# Patient Record
Sex: Male | Born: 1960 | Race: White | Hispanic: No | Marital: Married | Smoking: Former smoker
Health system: Southern US, Community
[De-identification: ages and names within clinical notes are randomized; demographics above are authoritative.]

## PROBLEM LIST (undated history)

## (undated) DIAGNOSIS — C801 Malignant (primary) neoplasm, unspecified: Secondary | ICD-10-CM

## (undated) DIAGNOSIS — E119 Type 2 diabetes mellitus without complications: Secondary | ICD-10-CM

## (undated) DIAGNOSIS — K219 Gastro-esophageal reflux disease without esophagitis: Secondary | ICD-10-CM

## (undated) DIAGNOSIS — I10 Essential (primary) hypertension: Secondary | ICD-10-CM

## (undated) DIAGNOSIS — M109 Gout, unspecified: Secondary | ICD-10-CM

## (undated) DIAGNOSIS — E785 Hyperlipidemia, unspecified: Secondary | ICD-10-CM

## (undated) HISTORY — DX: Hyperlipidemia, unspecified: E78.5

## (undated) HISTORY — PX: HERNIA REPAIR: SHX51

## (undated) HISTORY — PX: FRACTURE SURGERY: SHX138

## (undated) HISTORY — DX: Type 2 diabetes mellitus without complications: E11.9

## (undated) HISTORY — PX: OTHER SURGICAL HISTORY: SHX169

## (undated) HISTORY — PX: INGUINAL HERNIA REPAIR: SUR1180

---

## 2004-02-04 DIAGNOSIS — C801 Malignant (primary) neoplasm, unspecified: Secondary | ICD-10-CM

## 2004-02-04 HISTORY — DX: Malignant (primary) neoplasm, unspecified: C80.1

## 2004-02-04 HISTORY — PX: KIDNEY SURGERY: SHX687

## 2013-08-24 ENCOUNTER — Encounter (HOSPITAL_COMMUNITY): Payer: Self-pay | Admitting: Pharmacy Technician

## 2013-08-24 ENCOUNTER — Encounter (HOSPITAL_COMMUNITY): Payer: Self-pay | Admitting: *Deleted

## 2013-08-24 NOTE — Progress Notes (Signed)
08/24/13 1903  OBSTRUCTIVE SLEEP APNEA  Have you ever been diagnosed with sleep apnea through a sleep study? No  Do you snore loudly (loud enough to be heard through closed doors)?  0  Do you often feel tired, fatigued, or sleepy during the daytime? 0  Has anyone observed you stop breathing during your sleep? 0  Do you have, or are you being treated for high blood pressure? 1  BMI more than 35 kg/m2? 1  Age over 51 years old? 1  Neck circumference greater than 40 cm/18 inches? 0  Gender: 1  Obstructive Sleep Apnea Score 4  Score 4 or greater  Results sent to PCP

## 2013-08-24 NOTE — Progress Notes (Signed)
Pt denies SOB, chest pain, and being under the care of a cardiologist. Pt denies having any cardiac studies or an EKG within the last year but states that he had a chest x ray last month at [facility]. Pt made aware to Stop taking Aspirin, vitamins,  and herbal medications (fish oil) Do not take any NSAIDs ie: Ibuprofen, Advil, Naproxen or any medication containing Aspirin.

## 2013-08-25 ENCOUNTER — Encounter (HOSPITAL_COMMUNITY): Payer: Self-pay | Admitting: Surgery

## 2013-08-25 ENCOUNTER — Observation Stay (HOSPITAL_COMMUNITY)
Admission: RE | Admit: 2013-08-25 | Discharge: 2013-08-26 | Disposition: A | Discharge status: Home / Self Care | Payer: BC Managed Care – PPO | Source: Ambulatory Visit | Attending: Orthopedic Surgery | Admitting: Orthopedic Surgery

## 2013-08-25 ENCOUNTER — Inpatient Hospital Stay (HOSPITAL_COMMUNITY): Payer: BC Managed Care – PPO

## 2013-08-25 ENCOUNTER — Inpatient Hospital Stay (HOSPITAL_COMMUNITY): Payer: BC Managed Care – PPO | Admitting: Anesthesiology

## 2013-08-25 ENCOUNTER — Observation Stay (HOSPITAL_COMMUNITY): Payer: BC Managed Care – PPO

## 2013-08-25 ENCOUNTER — Encounter (HOSPITAL_COMMUNITY)
Admission: RE | Disposition: A | Discharge status: Home / Self Care | Payer: Self-pay | Source: Ambulatory Visit | Attending: Orthopedic Surgery

## 2013-08-25 ENCOUNTER — Encounter (HOSPITAL_COMMUNITY): Payer: BC Managed Care – PPO | Admitting: Anesthesiology

## 2013-08-25 DIAGNOSIS — I10 Essential (primary) hypertension: Secondary | ICD-10-CM | POA: Insufficient documentation

## 2013-08-25 DIAGNOSIS — S82109A Unspecified fracture of upper end of unspecified tibia, initial encounter for closed fracture: Principal | ICD-10-CM | POA: Insufficient documentation

## 2013-08-25 DIAGNOSIS — Z79899 Other long term (current) drug therapy: Secondary | ICD-10-CM | POA: Insufficient documentation

## 2013-08-25 DIAGNOSIS — K219 Gastro-esophageal reflux disease without esophagitis: Secondary | ICD-10-CM | POA: Insufficient documentation

## 2013-08-25 HISTORY — DX: Essential (primary) hypertension: I10

## 2013-08-25 HISTORY — DX: Malignant (primary) neoplasm, unspecified: C80.1

## 2013-08-25 HISTORY — PX: ORIF TIBIA PLATEAU: SHX2132

## 2013-08-25 HISTORY — DX: Gastro-esophageal reflux disease without esophagitis: K21.9

## 2013-08-25 HISTORY — DX: Gout, unspecified: M10.9

## 2013-08-25 LAB — CBC
Hemoglobin: 14.2 g/dL (ref 13.0–17.0)
MCH: 31.3 pg (ref 26.0–34.0)
MCHC: 34.7 g/dL (ref 30.0–36.0)
Platelets: 206 10*3/uL (ref 150–400)
RDW: 13.8 % (ref 11.5–15.5)

## 2013-08-25 LAB — BASIC METABOLIC PANEL
BUN: 24 mg/dL — ABNORMAL HIGH (ref 6–23)
Chloride: 95 mEq/L — ABNORMAL LOW (ref 96–112)
Creatinine, Ser: 1.45 mg/dL — ABNORMAL HIGH (ref 0.50–1.35)
GFR calc Af Amer: 63 mL/min — ABNORMAL LOW (ref >90)
GFR calc non Af Amer: 54 mL/min — ABNORMAL LOW (ref >90)
Glucose, Bld: 114 mg/dL — ABNORMAL HIGH (ref 70–99)
Potassium: 4.3 mEq/L (ref 3.5–5.1)

## 2013-08-25 LAB — TYPE AND SCREEN: ABO/RH(D): O POS

## 2013-08-25 LAB — ABO/RH: ABO/RH(D): O POS

## 2013-08-25 SURGERY — OPEN REDUCTION INTERNAL FIXATION (ORIF) TIBIAL PLATEAU
Anesthesia: General | Site: Knee | Laterality: Left | Wound class: Clean

## 2013-08-25 MED ORDER — DEXTROSE 5 % IV SOLN
500.0000 mg | Freq: Four times a day (QID) | INTRAVENOUS | Status: DC | PRN
  Filled 2013-08-25: qty 5

## 2013-08-25 MED ORDER — DEXTROSE-NACL 5-0.45 % IV SOLN
INTRAVENOUS | Status: AC
  Administered 2013-08-25: 14:00:00 via INTRAVENOUS

## 2013-08-25 MED ORDER — ROCURONIUM BROMIDE 100 MG/10ML IV SOLN
INTRAVENOUS | Status: DC | PRN
  Administered 2013-08-25: 50 mg via INTRAVENOUS

## 2013-08-25 MED ORDER — OMEPRAZOLE MAGNESIUM 20 MG PO TBEC: 20.0000 mg | DELAYED_RELEASE_TABLET | Freq: Every day | ORAL | Status: DC

## 2013-08-25 MED ORDER — PANTOPRAZOLE SODIUM 40 MG PO TBEC
40.0000 mg | DELAYED_RELEASE_TABLET | Freq: Every day | ORAL | Status: DC
  Filled 2013-08-25: qty 1

## 2013-08-25 MED ORDER — NEOSTIGMINE METHYLSULFATE 1 MG/ML IJ SOLN
INTRAMUSCULAR | Status: DC | PRN
  Administered 2013-08-25: 2 mg via INTRAVENOUS
  Administered 2013-08-25: 3 mg via INTRAVENOUS

## 2013-08-25 MED ORDER — ASPIRIN EC 325 MG PO TBEC: 81.0000 mg | DELAYED_RELEASE_TABLET | Freq: Every day | ORAL | Status: DC

## 2013-08-25 MED ORDER — PHENYLEPHRINE HCL 10 MG/ML IJ SOLN
10.0000 mg | INTRAVENOUS | Status: DC | PRN
  Administered 2013-08-25: 60 ug/min via INTRAVENOUS

## 2013-08-25 MED ORDER — OXYCODONE-ACETAMINOPHEN 5-325 MG PO TABS: 2.0000 | ORAL_TABLET | ORAL | Status: DC | PRN

## 2013-08-25 MED ORDER — ONDANSETRON HCL 4 MG PO TABS: 4.0000 mg | ORAL_TABLET | Freq: Four times a day (QID) | ORAL | Status: DC | PRN

## 2013-08-25 MED ORDER — BUPIVACAINE-EPINEPHRINE PF 0.5-1:200000 % IJ SOLN
INTRAMUSCULAR | Status: DC | PRN
  Administered 2013-08-25: 150 mg

## 2013-08-25 MED ORDER — ALLOPURINOL 300 MG PO TABS
300.0000 mg | ORAL_TABLET | Freq: Every day | ORAL | Status: DC
  Filled 2013-08-25: qty 1

## 2013-08-25 MED ORDER — 0.9 % SODIUM CHLORIDE (POUR BTL) OPTIME
TOPICAL | Status: DC | PRN
  Administered 2013-08-25: 1000 mL

## 2013-08-25 MED ORDER — ARTIFICIAL TEARS OP OINT
TOPICAL_OINTMENT | OPHTHALMIC | Status: DC | PRN
  Administered 2013-08-25: 1 via OPHTHALMIC

## 2013-08-25 MED ORDER — ONDANSETRON HCL 4 MG/2ML IJ SOLN: 4.0000 mg | Freq: Four times a day (QID) | INTRAMUSCULAR | Status: DC | PRN

## 2013-08-25 MED ORDER — DEXTROSE-NACL 5-0.45 % IV SOLN: 100.0000 mL/h | INTRAVENOUS | Status: DC

## 2013-08-25 MED ORDER — MIDAZOLAM HCL 5 MG/5ML IJ SOLN
INTRAMUSCULAR | Status: DC | PRN
  Administered 2013-08-25: 2 mg via INTRAVENOUS

## 2013-08-25 MED ORDER — LISINOPRIL 20 MG PO TABS
20.0000 mg | ORAL_TABLET | Freq: Every day | ORAL | Status: DC
  Administered 2013-08-25: 20 mg via ORAL
  Filled 2013-08-25 (×3): qty 1

## 2013-08-25 MED ORDER — DEXAMETHASONE SODIUM PHOSPHATE 4 MG/ML IJ SOLN
INTRAMUSCULAR | Status: DC | PRN
  Administered 2013-08-25: 4 mg

## 2013-08-25 MED ORDER — NIACIN 250 MG PO TABS
250.0000 mg | ORAL_TABLET | Freq: Every day | ORAL | Status: DC
  Administered 2013-08-26: 250 mg via ORAL
  Filled 2013-08-25 (×2): qty 1

## 2013-08-25 MED ORDER — PROPRANOLOL HCL 1 MG/ML IV SOLN
INTRAVENOUS | Status: DC | PRN
  Administered 2013-08-25 (×2): .5 mg via INTRAVENOUS

## 2013-08-25 MED ORDER — METHOCARBAMOL 500 MG PO TABS
500.0000 mg | ORAL_TABLET | Freq: Four times a day (QID) | ORAL | Status: DC | PRN
  Administered 2013-08-25 – 2013-08-26 (×4): 500 mg via ORAL
  Filled 2013-08-25 (×4): qty 1

## 2013-08-25 MED ORDER — PROMETHAZINE HCL 25 MG/ML IJ SOLN: 6.2500 mg | INTRAMUSCULAR | Status: DC | PRN

## 2013-08-25 MED ORDER — GLYCOPYRROLATE 0.2 MG/ML IJ SOLN
INTRAMUSCULAR | Status: DC | PRN
  Administered 2013-08-25: 0.3 mg via INTRAVENOUS
  Administered 2013-08-25: .5 mg via INTRAVENOUS

## 2013-08-25 MED ORDER — LISINOPRIL-HYDROCHLOROTHIAZIDE 20-12.5 MG PO TABS: 1.0000 | ORAL_TABLET | Freq: Every day | ORAL | Status: DC

## 2013-08-25 MED ORDER — ONDANSETRON HCL 4 MG/2ML IJ SOLN
INTRAMUSCULAR | Status: DC | PRN
  Administered 2013-08-25: 4 mg via INTRAVENOUS

## 2013-08-25 MED ORDER — DEXTROSE 5 % IV SOLN
3.0000 g | INTRAVENOUS | Status: AC
  Administered 2013-08-25: 3 g via INTRAVENOUS
  Filled 2013-08-25: qty 3000

## 2013-08-25 MED ORDER — OXYCODONE-ACETAMINOPHEN 5-325 MG PO TABS
1.0000 | ORAL_TABLET | ORAL | Status: DC | PRN
  Administered 2013-08-25 – 2013-08-26 (×6): 2 via ORAL
  Filled 2013-08-25 (×7): qty 2

## 2013-08-25 MED ORDER — FENTANYL CITRATE 0.05 MG/ML IJ SOLN
INTRAMUSCULAR | Status: DC | PRN
  Administered 2013-08-25: 50 ug via INTRAVENOUS
  Administered 2013-08-25: 100 ug via INTRAVENOUS
  Administered 2013-08-25 (×2): 50 ug via INTRAVENOUS

## 2013-08-25 MED ORDER — HYDROCHLOROTHIAZIDE 12.5 MG PO CAPS
12.5000 mg | ORAL_CAPSULE | Freq: Every day | ORAL | Status: DC
  Administered 2013-08-25: 12.5 mg via ORAL
  Filled 2013-08-25 (×2): qty 1

## 2013-08-25 MED ORDER — OXYCODONE HCL 5 MG PO TABS: 5.0000 mg | ORAL_TABLET | Freq: Once | ORAL | Status: DC | PRN

## 2013-08-25 MED ORDER — PROPOFOL 10 MG/ML IV BOLUS
INTRAVENOUS | Status: DC | PRN
  Administered 2013-08-25: 200 mg via INTRAVENOUS

## 2013-08-25 MED ORDER — ACETAMINOPHEN 500 MG PO TABS
1000.0000 mg | ORAL_TABLET | Freq: Once | ORAL | Status: AC
  Administered 2013-08-25: 1000 mg via ORAL
  Filled 2013-08-25: qty 2

## 2013-08-25 MED ORDER — DEXTROSE 5 % IV SOLN
INTRAVENOUS | Status: DC | PRN
  Administered 2013-08-25: 08:00:00 via INTRAVENOUS

## 2013-08-25 MED ORDER — CHLORHEXIDINE GLUCONATE 4 % EX LIQD: 60.0000 mL | Freq: Once | CUTANEOUS | Status: DC

## 2013-08-25 MED ORDER — OXYCODONE HCL 5 MG/5ML PO SOLN: 5.0000 mg | Freq: Once | ORAL | Status: DC | PRN

## 2013-08-25 MED ORDER — HYDROMORPHONE HCL PF 1 MG/ML IJ SOLN
0.2500 mg | INTRAMUSCULAR | Status: DC | PRN
  Administered 2013-08-25 (×2): 0.5 mg via INTRAVENOUS

## 2013-08-25 MED ORDER — HYDROMORPHONE HCL PF 1 MG/ML IJ SOLN
INTRAMUSCULAR | Status: AC
  Administered 2013-08-25: 0.5 mg via INTRAVENOUS
  Filled 2013-08-25: qty 1

## 2013-08-25 MED ORDER — MORPHINE SULFATE 2 MG/ML IJ SOLN: 1.0000 mg | INTRAMUSCULAR | Status: DC | PRN

## 2013-08-25 MED ORDER — CEFAZOLIN SODIUM-DEXTROSE 2-3 GM-% IV SOLR
2.0000 g | Freq: Four times a day (QID) | INTRAVENOUS | Status: AC
  Administered 2013-08-25 – 2013-08-26 (×3): 2 g via INTRAVENOUS
  Filled 2013-08-25 (×3): qty 50

## 2013-08-25 MED ORDER — METOCLOPRAMIDE HCL 5 MG PO TABS
5.0000 mg | ORAL_TABLET | Freq: Three times a day (TID) | ORAL | Status: DC | PRN
  Filled 2013-08-25: qty 2

## 2013-08-25 MED ORDER — LACTATED RINGERS IV SOLN
INTRAVENOUS | Status: DC | PRN
  Administered 2013-08-25 (×2): via INTRAVENOUS

## 2013-08-25 MED ORDER — ASPIRIN 81 MG PO CHEW
81.0000 mg | CHEWABLE_TABLET | Freq: Every day | ORAL | Status: DC
  Administered 2013-08-25 – 2013-08-26 (×2): 81 mg via ORAL
  Filled 2013-08-25 (×2): qty 1

## 2013-08-25 MED ORDER — METOCLOPRAMIDE HCL 5 MG/ML IJ SOLN: 5.0000 mg | Freq: Three times a day (TID) | INTRAMUSCULAR | Status: DC | PRN

## 2013-08-25 MED ORDER — ASPIRIN EC 325 MG PO TBEC: 325.0000 mg | DELAYED_RELEASE_TABLET | Freq: Every day | ORAL | Status: DC

## 2013-08-25 MED ORDER — PHENYLEPHRINE HCL 10 MG/ML IJ SOLN
INTRAMUSCULAR | Status: DC | PRN
  Administered 2013-08-25 (×3): 80 ug via INTRAVENOUS

## 2013-08-25 MED ORDER — ALBUMIN HUMAN 5 % IV SOLN
INTRAVENOUS | Status: DC | PRN
  Administered 2013-08-25: 08:00:00 via INTRAVENOUS

## 2013-08-25 SURGICAL SUPPLY — 63 items
BANDAGE ELASTIC 4 VELCRO ST LF (GAUZE/BANDAGES/DRESSINGS) IMPLANT
BANDAGE ELASTIC 6 VELCRO ST LF (GAUZE/BANDAGES/DRESSINGS) ×1 IMPLANT
BANDAGE ESMARK 6X9 LF (GAUZE/BANDAGES/DRESSINGS) ×1 IMPLANT
BANDAGE GAUZE ELAST BULKY 4 IN (GAUZE/BANDAGES/DRESSINGS) ×2 IMPLANT
BIT DRILL 2.5X125 (BIT) ×1 IMPLANT
BIT DRILL 3.1X204 (BIT) ×1 IMPLANT
BLADE SURG 10 STRL SS (BLADE) ×2 IMPLANT
BLADE SURG 15 STRL LF DISP TIS (BLADE) ×1 IMPLANT
BLADE SURG 15 STRL SS (BLADE) ×2
BNDG CMPR 9X6 STRL LF SNTH (GAUZE/BANDAGES/DRESSINGS) ×1
BNDG COHESIVE 4X5 TAN STRL (GAUZE/BANDAGES/DRESSINGS) ×2 IMPLANT
BNDG ESMARK 6X9 LF (GAUZE/BANDAGES/DRESSINGS) ×2
CLOTH BEACON ORANGE TIMEOUT ST (SAFETY) ×2 IMPLANT
CONNECTOR 5 IN 1 STRAIGHT STRL (MISCELLANEOUS) ×1 IMPLANT
COVER MAYO STAND STRL (DRAPES) ×2 IMPLANT
COVER SURGICAL LIGHT HANDLE (MISCELLANEOUS) ×2 IMPLANT
CUFF TOURNIQUET SINGLE 34IN LL (TOURNIQUET CUFF) ×1 IMPLANT
DRAPE C-ARM 42X72 X-RAY (DRAPES) ×2 IMPLANT
DRAPE C-ARMOR (DRAPES) ×1 IMPLANT
DRAPE INCISE IOBAN 66X45 STRL (DRAPES) ×4 IMPLANT
DRAPE U-SHAPE 47X51 STRL (DRAPES) ×2 IMPLANT
DRSG PAD ABDOMINAL 8X10 ST (GAUZE/BANDAGES/DRESSINGS) ×8 IMPLANT
ELECT REM PT RETURN 9FT ADLT (ELECTROSURGICAL) ×2
ELECTRODE REM PT RTRN 9FT ADLT (ELECTROSURGICAL) ×1 IMPLANT
GLOVE BIO SURGEON STRL SZ7.5 (GLOVE) ×2 IMPLANT
GLOVE BIOGEL PI IND STRL 8 (GLOVE) ×1 IMPLANT
GLOVE BIOGEL PI INDICATOR 8 (GLOVE) ×1
GOWN STRL NON-REIN LRG LVL3 (GOWN DISPOSABLE) ×4 IMPLANT
IMMOBILIZER KNEE 22 UNIV (SOFTGOODS) IMPLANT
KIT BASIN OR (CUSTOM PROCEDURE TRAY) ×2 IMPLANT
KIT ROOM TURNOVER OR (KITS) ×2 IMPLANT
MANIFOLD NEPTUNE II (INSTRUMENTS) ×2 IMPLANT
NS IRRIG 1000ML POUR BTL (IV SOLUTION) ×2 IMPLANT
PACK ARTHROSCOPY DSU (CUSTOM PROCEDURE TRAY) ×1 IMPLANT
PAD ARMBOARD 7.5X6 YLW CONV (MISCELLANEOUS) ×4 IMPLANT
PIN FIX STIENMAN 2X230 (PIN) IMPLANT
PIN STIENMAN 2.0 (PIN) ×4 IMPLANT
PLATE PROX LAT TIBIA 4H (Plate) ×1 IMPLANT
SCREW  LOCKING 4.0 (Screw) ×1 IMPLANT
SCREW CORT 2.5XFT 44X3.5XST (Screw) IMPLANT
SCREW CORTEX ST MATTA 3.5X55MM (Screw) ×1 IMPLANT
SCREW CORTEX ST MATTA 3.5X90MM (Screw) ×1 IMPLANT
SCREW CORTICAL 3.5X42MM (Screw) ×1 IMPLANT
SCREW CORTICAL 3.5X44MM (Screw) ×2 IMPLANT
SCREW LOCKING 75MM (Screw) ×1 IMPLANT
SPONGE GAUZE 4X4 12PLY (GAUZE/BANDAGES/DRESSINGS) ×2 IMPLANT
SPONGE LAP 18X18 X RAY DECT (DISPOSABLE) ×3 IMPLANT
STAPLER VISISTAT 35W (STAPLE) IMPLANT
STOCKINETTE IMPERVIOUS LG (DRAPES) ×2 IMPLANT
SUCTION FRAZIER TIP 10 FR DISP (SUCTIONS) ×2 IMPLANT
SUT ETHILON 3 0 PS 1 (SUTURE) ×1 IMPLANT
SUT FIBERWIRE #2 38 T-5 BLUE (SUTURE) ×4
SUT MNCRL AB 4-0 PS2 18 (SUTURE) ×1 IMPLANT
SUT MON AB 2-0 CT1 36 (SUTURE) ×1 IMPLANT
SUT VIC AB 0 CT1 27 (SUTURE) ×4
SUT VIC AB 0 CT1 27XBRD ANBCTR (SUTURE) ×2 IMPLANT
SUTURE FIBERWR #2 38 T-5 BLUE (SUTURE) IMPLANT
TOWEL OR 17X24 6PK STRL BLUE (TOWEL DISPOSABLE) ×2 IMPLANT
TOWEL OR 17X26 10 PK STRL BLUE (TOWEL DISPOSABLE) ×4 IMPLANT
TRAY FOLEY CATH 16FRSI W/METER (SET/KITS/TRAYS/PACK) IMPLANT
TUBE CONNECTING 12X1/4 (SUCTIONS) ×2 IMPLANT
WATER STERILE IRR 1000ML POUR (IV SOLUTION) ×2 IMPLANT
YANKAUER SUCT BULB TIP NO VENT (SUCTIONS) ×2 IMPLANT
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
## 2013-08-25 NOTE — Preoperative (Signed)
Beta Blockers   Reason not to administer Beta Blockers:Not Applicable 

## 2013-08-25 NOTE — Anesthesia Postprocedure Evaluation (Signed)
Anesthesia Post Note  Patient: Gordon Green  Procedure(s) Performed: Procedure(s) (LRB): OPEN REDUCTION INTERNAL FIXATION (ORIF) TIBIAL PLATEAU (Left)  Anesthesia type: general  Patient location: PACU  Post pain: Pain level controlled  Post assessment: Patient's Cardiovascular Status Stable  Last Vitals:  Filed Vitals:   08/25/13 1222  BP: 128/68  Pulse: 106  Temp: 37.2 C  Resp: 18    Post vital signs: Reviewed and stable  Level of consciousness: sedated  Complications: No apparent anesthesia complications

## 2013-08-25 NOTE — Anesthesia Preprocedure Evaluation (Addendum)
Anesthesia Evaluation  Patient identified by MRN, date of birth, ID band Patient awake    Reviewed: Allergy & Precautions, H&P , NPO status , Patient's Chart, lab work & pertinent test results, reviewed documented beta blocker date and time   History of Anesthesia Complications Negative for: history of anesthetic complications  Airway Mallampati: I TM Distance: >3 FB Neck ROM: Full    Dental  (+) Dental Advisory Given and Teeth Intact   Pulmonary neg pulmonary ROS,  breath sounds clear to auscultation  Pulmonary exam normal       Cardiovascular METS: hypertension, Pt. on medications Rhythm:Regular Rate:Normal     Neuro/Psych negative neurological ROS  negative psych ROS   GI/Hepatic Neg liver ROS, GERD-  Medicated,  Endo/Other  negative endocrine ROS  Renal/GU negative Renal ROS     Musculoskeletal   Abdominal (+) + obese,   Peds  Hematology   Anesthesia Other Findings Full Beard  Reproductive/Obstetrics                        Anesthesia Physical Anesthesia Plan  ASA: II  Anesthesia Plan: General   Post-op Pain Management:    Induction: Intravenous  Airway Management Planned: LMA  Additional Equipment:   Intra-op Plan:   Post-operative Plan: Extubation in OR  Informed Consent: I have reviewed the patients History and Physical, chart, labs and discussed the procedure including the risks, benefits and alternatives for the proposed anesthesia with the patient or authorized representative who has indicated his/her understanding and acceptance.   Dental advisory given  Plan Discussed with: CRNA, Anesthesiologist and Surgeon  Anesthesia Plan Comments:        Anesthesia Quick Evaluation

## 2013-08-25 NOTE — Transfer of Care (Signed)
Immediate Anesthesia Transfer of Care Note  Patient: Gordon Green  Procedure(s) Performed: Procedure(s): OPEN REDUCTION INTERNAL FIXATION (ORIF) TIBIAL PLATEAU (Left)  Patient Location: PACU  Anesthesia Type:General  Level of Consciousness: awake and patient cooperative  Airway & Oxygen Therapy: Patient Spontanous Breathing and Patient connected to face mask oxygen  Post-op Assessment: Report given to PACU RN and Post -op Vital signs reviewed and stable  Post vital signs: Reviewed and stable  Complications: No apparent anesthesia complications
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
## 2013-08-25 NOTE — Interval H&P Note (Signed)
History and Physical Interval Note:  08/25/2013 6:59 AM  Gordon Green  has presented today for surgery, with the diagnosis of LEFT TIBIAL PLATEAU FRACTURE  The various methods of treatment have been discussed with the patient and family. After consideration of risks, benefits and other options for treatment, the patient has consented to  Procedure(s): OPEN REDUCTION INTERNAL FIXATION (ORIF) TIBIAL PLATEAU (Left) as a surgical intervention .  The patient's history has been reviewed, patient examined, no change in status, stable for surgery.  I have reviewed the patient's chart and labs.  Questions were answered to the patient's satisfaction.     Averil Digman, D

## 2013-08-25 NOTE — Progress Notes (Signed)
Dr. Eulah Pont at stretcher side and told patient he would place order in [facility] for him to have 1,000 mg of acetaminophen. Patient and wife informed Dr. Eulah Pont that he had 325 mg of acetaminophen around 0545 this morning because he took a 7.5 mg Oxycodone. Dr. Eulah Pont stated that it was ok for patient to have the 1,000 mg Acetaminophen. Will administer medication as ordered.

## 2013-08-25 NOTE — Anesthesia Procedure Notes (Addendum)
Anesthesia Regional Block:  Femoral nerve block  Pre-Anesthetic Checklist: ,, timeout performed, Correct Patient, Correct Site, Correct Laterality, Correct Procedure, Correct Position, site marked, Risks and benefits discussed,  Surgical consent,  Pre-op evaluation,  At surgeon's request and post-op pain management  Laterality: Left  Prep: chloraprep       Needles:  Injection technique: Single-shot  Needle Type: Echogenic Stimulator Needle     Needle Length: 5cm 5 cm Needle Gauge: 22 and 22 G    Additional Needles:  Procedures: ultrasound guided (picture in chart) and nerve stimulator Femoral nerve block  Nerve Stimulator or Paresthesia:  Response: quadraceps contraction, 0.45 mA,   Additional Responses:   Narrative:  Start time: 08/25/2013 7:20 AM End time: 08/25/2013 7:29 AM Injection made incrementally with aspirations every 5 mL.  Performed by: Personally  Anesthesiologist: Halford Decamp, MD  Additional Notes: Functioning IV was confirmed and monitors were applied.  A 50mm 22ga Arrow echogenic stimulator needle was used. Sterile prep and drape,hand hygiene and sterile gloves were used. Ultrasound guidance: relevant anatomy identified, needle position confirmed, local anesthetic spread visualized around nerve(s)., vascular puncture avoided.  Image printed for medical record. Negative aspiration and negative test dose prior to incremental administration of local anesthetic. The patient tolerated the procedure well.    Femoral nerve block Procedure Name: Intubation Date/Time: 08/25/2013 7:47 AM Performed by: Darcey Nora B Pre-anesthesia Checklist: Patient identified, Patient being monitored, Emergency Drugs available and Suction available Patient Re-evaluated:Patient Re-evaluated prior to inductionOxygen Delivery Method: Circle system utilized Preoxygenation: Pre-oxygenation with 100% oxygen Intubation Type: IV induction Ventilation: Mask ventilation without  difficulty and Oral airway inserted - appropriate to patient size Laryngoscope Size: Mac and 4 Grade View: Grade II Tube type: Oral Tube size: 7.5 mm Number of attempts: 1 Airway Equipment and Method: Stylet Placement Confirmation: ETT inserted through vocal cords under direct vision,  breath sounds checked- equal and bilateral and positive ETCO2 Secured at: 23 (cm at teeth) cm Tube secured with: Tape Dental Injury: Teeth and Oropharynx as per pre-operative assessment

## 2013-08-26 LAB — BASIC METABOLIC PANEL
BUN: 26 mg/dL — ABNORMAL HIGH (ref 6–23)
CO2: 28 mEq/L (ref 19–32)
Chloride: 97 mEq/L (ref 96–112)
Glucose, Bld: 122 mg/dL — ABNORMAL HIGH (ref 70–99)
Potassium: 4.5 mEq/L (ref 3.5–5.1)
Sodium: 136 mEq/L (ref 135–145)

## 2013-08-26 LAB — HEMOGLOBIN AND HEMATOCRIT, BLOOD
HCT: 36.8 % — ABNORMAL LOW (ref 39.0–52.0)
Hemoglobin: 12.2 g/dL — ABNORMAL LOW (ref 13.0–17.0)
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
## 2013-08-26 NOTE — Discharge Summary (Signed)
Physician Discharge Summary  Patient ID: Gordon Green MRN: [id number] DOB/AGE: [birth year] 51 y.o.  Admit date: 08/25/2013 Discharge date: 08/26/2013  Admission Diagnoses:  <principal problem not specified>  Discharge Diagnoses:  Active Problems:   * No active hospital problems. *   Past Medical History  Diagnosis Date  . Hypertension   . Gout     Hx: of  . Cancer     ;Hx: of right kidney cancer; kidney removed in 2005  . GERD (gastroesophageal reflux disease)     Surgeries: Procedure(s): OPEN REDUCTION INTERNAL FIXATION (ORIF) TIBIAL PLATEAU on 08/25/2013   Consultants (if any):    Discharged Condition: Improved  Hospital Course: RIDWAN BONDY is an 52 y.o. male who was admitted 08/25/2013 with a diagnosis of <principal problem not specified> and went to the operating room on 08/25/2013 and underwent the above named procedures.    He was given perioperative antibiotics:  Anti-infectives   Start     Dose/Rate Route Frequency Ordered Stop   08/25/13 1300  ceFAZolin (ANCEF) IVPB 2 g/50 mL premix     2 g 100 mL/hr over 30 Minutes Intravenous Every 6 hours 08/25/13 1224 08/26/13 0249   08/25/13 0715  ceFAZolin (ANCEF) 3 g in dextrose 5 % 50 mL IVPB     3 g 160 mL/hr over 30 Minutes Intravenous On call to O.R. 08/25/13 0700 08/25/13 0750    .  He was given sequential compression devices, early ambulation, and ASA81 for DVT prophylaxis.  He benefited maximally from the hospital stay and there were no complications.    Recent vital signs:  Filed Vitals:   08/26/13 0751  BP: 105/61  Pulse: 79  Temp: 97.5 F (36.4 C)  Resp: 16    Recent laboratory studies:  Lab Results  Component Value Date   HGB 12.2* 08/26/2013   HGB 14.2 08/25/2013   Lab Results  Component Value Date   WBC 15.3* 08/25/2013   PLT 206 08/25/2013   No results found for this basename: INR   Lab Results  Component Value Date   NA 136 08/26/2013   K 4.5 08/26/2013   CL 97  08/26/2013   CO2 28 08/26/2013   BUN 26* 08/26/2013   CREATININE 1.41* 08/26/2013   GLUCOSE 122* 08/26/2013    Discharge Medications:     Medication List         acetaminophen 500 MG tablet  Commonly known as:  TYLENOL  Take 1,000 mg by mouth every 6 (six) hours as needed for pain.     allopurinol 300 MG tablet  Commonly known as:  ZYLOPRIM  Take 300 mg by mouth daily.     aspirin EC 325 MG tablet  Take 1 tablet (325 mg total) by mouth daily.     FISH OIL PO  Take 3 capsules by mouth daily.     lisinopril-hydrochlorothiazide 20-12.5 MG per tablet  Commonly known as:  PRINZIDE,ZESTORETIC  Take 1 tablet by mouth daily.     niacin 250 MG tablet  Take 250 mg by mouth daily with breakfast.     omeprazole 20 MG tablet  Commonly known as:  PRILOSEC OTC  Take 20 mg by mouth daily.     oxyCODONE-acetaminophen 5-325 MG per tablet  Commonly known as:  ROXICET  Take 2 tablets by mouth every 4 (four) hours as needed for pain.     oxyCODONE-acetaminophen 7.5-325 MG per tablet  Commonly known as:  PERCOCET  Take 1 tablet by mouth  every 4 (four) hours as needed for pain.     Potassium Gluconate 595 MG Caps  Take 595 mg by mouth daily.     vitamin B-12 1000 MCG tablet  Commonly known as:  CYANOCOBALAMIN  Take 1,000 mcg by mouth daily.     VITAMIN D-3 PO  Take 1 tablet by mouth daily.        Diagnostic Studies: Dg Chest 2 View  08/25/2013   CLINICAL DATA:  Preop for left knee surgery.  EXAM: CHEST  2 VIEW  COMPARISON:  None.  FINDINGS: The heart size and mediastinal contours are within normal limits. Both lungs are clear. The visualized skeletal structures are unremarkable.  IMPRESSION: No active cardiopulmonary disease.   Electronically Signed   By: Burman Nieves M.D.   On: 08/25/2013 06:35   Dg Tibia/fibula Left  08/25/2013   CLINICAL DATA:  Fracture  EXAM: DG C-ARM 1-60 MIN; LEFT TIBIA AND FIBULA - 2 VIEW  COMPARISON:  None.  FINDINGS: Plate and screws transfix a  tibial plateau fracture. Anatomic alignment. No breakage or loosening of the hardware.  IMPRESSION: ORIF tibial plateau fracture.   Electronically Signed   By: Maryclare Bean M.D.   On: 08/25/2013 10:13   Dg Knee Left Port  08/25/2013   CLINICAL DATA:  Knee fracture, postoperative.  EXAM: PORTABLE LEFT KNEE - 1-2 VIEW  COMPARISON:  None.  FINDINGS: Buttress plate and screw fixation of tibial plateau fracture noted. Screws traverse the longitudinal fracture of the medial tibial plateau. Posterior fragments noted along the fracture plane. No complicating feature observed.  Mild patellar spurring. 1.6 x 0.7 cm bony fragment noted along the lateral side of the lateral femoral condyle.  IMPRESSION: 1. Tibial plateau fracture ORIF, without complicating feature. 2. Bony fragment along the lateral margin lateral femoral condyle.   Electronically Signed   By: Herbie Baltimore M.D.   On: 08/25/2013 11:26   Dg C-arm 1-60 Min  08/25/2013   CLINICAL DATA:  Fracture  EXAM: DG C-ARM 1-60 MIN; LEFT TIBIA AND FIBULA - 2 VIEW  COMPARISON:  None.  FINDINGS: Plate and screws transfix a tibial plateau fracture. Anatomic alignment. No breakage or loosening of the hardware.  IMPRESSION: ORIF tibial plateau fracture.   Electronically Signed   By: Maryclare Bean M.D.   On: 08/25/2013 10:13    Disposition: Final discharge disposition not confirmed        Follow-up Information   Follow up with Sheral Apley, MD. (as scheduled)    Specialty:  Orthopedic Surgery   Contact information:   [address] [place] [address] [phone]        Signed: Margarita Rana, D 08/26/2013, 8:53 AM

## 2013-08-26 NOTE — Progress Notes (Signed)
08/26/13 Spoke with patient and his wife about HHC. They chose [facility]. Contacted Marie at [employer] and set up HHPT. Contacted Darian at [facility] and requested wheelchair with ELR, rolling walker, and 3N1. Rolling walker and 3N1 to be delivered to patient's room and the wheelchair will be delivered to his home. Jacquelynn Cree RN, BSN, CCM

## 2013-08-26 NOTE — Progress Notes (Signed)
Patient discharged to home accompanied by family. Discharge instructions and rx given and explained and patient stated understanding. IV was removed. Patient left unit in a stable condition via wheelchair. 
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
## 2013-08-31 ENCOUNTER — Encounter (HOSPITAL_COMMUNITY): Payer: Self-pay | Admitting: Orthopedic Surgery

## 2014-09-17 ENCOUNTER — Encounter: Payer: Self-pay | Admitting: Internal Medicine

## 2014-11-16 ENCOUNTER — Ambulatory Visit (AMBULATORY_SURGERY_CENTER): Payer: Self-pay

## 2014-11-16 VITALS — Ht 72.0 in | Wt 280.0 lb

## 2014-11-16 DIAGNOSIS — Z1211 Encounter for screening for malignant neoplasm of colon: Secondary | ICD-10-CM

## 2014-11-16 MED ORDER — MOVIPREP 100 G PO SOLR: ORAL | Status: DC

## 2014-11-16 NOTE — Progress Notes (Signed)
Per pt, no allergies to soy or egg products.Pt not taking any weight loss meds or using  O2 at home. 

## 2014-11-30 ENCOUNTER — Ambulatory Visit (AMBULATORY_SURGERY_CENTER): Payer: BLUE CROSS/BLUE SHIELD | Admitting: Internal Medicine

## 2014-11-30 ENCOUNTER — Encounter: Payer: Self-pay | Admitting: Internal Medicine

## 2014-11-30 VITALS — BP 107/63 | HR 67 | Temp 97.3°F | Resp 18 | Ht 72.0 in | Wt 280.0 lb

## 2014-11-30 DIAGNOSIS — D122 Benign neoplasm of ascending colon: Secondary | ICD-10-CM

## 2014-11-30 DIAGNOSIS — D125 Benign neoplasm of sigmoid colon: Secondary | ICD-10-CM

## 2014-11-30 DIAGNOSIS — Z1211 Encounter for screening for malignant neoplasm of colon: Secondary | ICD-10-CM

## 2014-11-30 DIAGNOSIS — D123 Benign neoplasm of transverse colon: Secondary | ICD-10-CM

## 2014-11-30 MED ORDER — SODIUM CHLORIDE 0.9 % IV SOLN: 500.0000 mL | INTRAVENOUS | Status: DC

## 2014-11-30 NOTE — Op Note (Signed)
[place]  [address] [place], [address]   COLONOSCOPY PROCEDURE REPORT  PATIENT: Gordon Green, Gordon Green  MR#: [id number] BIRTHDATE: 1960/12/31 , 43  yrs. old GENDER: male ENDOSCOPIST: Eustace Quail, MD REFERRED AJ:OINO Baldemar Lenis, M.D. PROCEDURE DATE:  11/30/2014 PROCEDURE:   Colonoscopy with snare polypectomy x 3 First Screening Colonoscopy - Avg.  risk and is 50 yrs.  old or older Yes.  Prior Negative Screening - Now for repeat screening. N/A  History of Adenoma - Now for follow-up colonoscopy & has been > or = to 3 yrs.  N/A  Polyps Removed Today? Yes. ASA CLASS:   Class II INDICATIONS:average risk for colorectal cancer. MEDICATIONS: Monitored anesthesia care and Propofol 400 mg IV  DESCRIPTION OF PROCEDURE:   After the risks benefits and alternatives of the procedure were thoroughly explained, informed consent was obtained.  The digital rectal exam revealed no abnormalities of the rectum.   The LB [id number]  endoscope was introduced through the anus and advanced to the cecum, which was identified by both the appendix and ileocecal valve. No adverse events experienced.   The quality of the prep was excellent, using MoviPrep  The instrument was then slowly withdrawn as the colon was fully examined.  COLON FINDINGS: Three polyps ranging between 3-10mm in size were found in the sigmoid colon, transverse colon, and ascending colon. A polypectomy was performed with a cold snare.  The resection was complete, the polyp tissue was completely retrieved and sent to histology.   There was moderate diverticulosis noted in the sigmoid colon.   The examination was otherwise normal.  Retroflexed views revealed internal hemorrhoids. The time to cecum=3 minutes 12 seconds.  Withdrawal time=12 minutes 42 seconds.  The scope was withdrawn and the procedure completed. COMPLICATIONS: There were no immediate complications.  ENDOSCOPIC IMPRESSION: 1.   Three polyps were  found in the sigmoid, transverse, and ascending colon; polypectomy was performed with a cold snare 2.   Moderate diverticulosis was noted in the sigmoid colon 3.   The examination was otherwise normal  RECOMMENDATIONS: 1. Repeat Colonoscopy in 3 years if all 3 adenomas, otherwise 5 years.  eSigned:  Eustace Quail, MD 11/30/2014 11:14 AM   cc: Dimas Aguas, MD and The Patient

## 2014-11-30 NOTE — Progress Notes (Signed)
Called to room to assist during endoscopic procedure.  Patient ID and intended procedure confirmed with present staff. Received instructions for my participation in the procedure from the performing physician.  

## 2014-11-30 NOTE — Progress Notes (Signed)
Patient awakening,vss,report to rn 

## 2014-11-30 NOTE — Patient Instructions (Signed)

## 2014-12-01 ENCOUNTER — Telehealth: Payer: Self-pay | Admitting: *Deleted

## 2014-12-01 NOTE — Telephone Encounter (Signed)
  Follow up Call-  Call back number 11/30/2014  Post procedure Call Back phone  # [phone] cell  Permission to leave phone message Yes     Patient questions:  Do you have a fever, pain , or abdominal swelling? No. Pain Score  0 *  Have you tolerated food without any problems? Yes.    Have you been able to return to your normal activities? Yes.    Do you have any questions about your discharge instructions: Diet   No. Medications  No. Follow up visit  No.  Do you have questions or concerns about your Care? No.  Actions: * If pain score is 4 or above: No action needed, pain <4.

## 2014-12-07 ENCOUNTER — Encounter: Payer: Self-pay | Admitting: Internal Medicine

## 2015-04-24 ENCOUNTER — Ambulatory Visit (INDEPENDENT_AMBULATORY_CARE_PROVIDER_SITE_OTHER): Payer: BLUE CROSS/BLUE SHIELD

## 2015-04-24 ENCOUNTER — Ambulatory Visit (INDEPENDENT_AMBULATORY_CARE_PROVIDER_SITE_OTHER): Payer: BLUE CROSS/BLUE SHIELD | Admitting: Emergency Medicine

## 2015-04-24 VITALS — BP 128/80 | HR 104 | Temp 98.6°F | Resp 22 | Ht 71.5 in | Wt 278.0 lb

## 2015-04-24 DIAGNOSIS — M545 Low back pain, unspecified: Secondary | ICD-10-CM

## 2015-04-24 DIAGNOSIS — S40012A Contusion of left shoulder, initial encounter: Secondary | ICD-10-CM

## 2015-04-24 DIAGNOSIS — R071 Chest pain on breathing: Secondary | ICD-10-CM

## 2015-04-24 DIAGNOSIS — C649 Malignant neoplasm of unspecified kidney, except renal pelvis: Secondary | ICD-10-CM | POA: Insufficient documentation

## 2015-04-24 MED ORDER — OXYCODONE-ACETAMINOPHEN 5-325 MG PO TABS: 1.0000 | ORAL_TABLET | ORAL | Status: DC | PRN
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
## 2015-04-24 NOTE — Patient Instructions (Signed)

## 2015-04-27 ENCOUNTER — Ambulatory Visit (INDEPENDENT_AMBULATORY_CARE_PROVIDER_SITE_OTHER): Payer: BLUE CROSS/BLUE SHIELD | Admitting: Family Medicine

## 2015-04-27 ENCOUNTER — Ambulatory Visit (INDEPENDENT_AMBULATORY_CARE_PROVIDER_SITE_OTHER): Payer: BLUE CROSS/BLUE SHIELD

## 2015-04-27 ENCOUNTER — Ambulatory Visit (HOSPITAL_COMMUNITY)
Admission: RE | Admit: 2015-04-27 | Discharge: 2015-04-27 | Disposition: A | Discharge status: Home / Self Care | Payer: BLUE CROSS/BLUE SHIELD | Source: Ambulatory Visit | Attending: Family Medicine | Admitting: Family Medicine

## 2015-04-27 DIAGNOSIS — S301XXD Contusion of abdominal wall, subsequent encounter: Secondary | ICD-10-CM

## 2015-04-27 DIAGNOSIS — R0600 Dyspnea, unspecified: Secondary | ICD-10-CM

## 2015-04-27 DIAGNOSIS — S20212D Contusion of left front wall of thorax, subsequent encounter: Secondary | ICD-10-CM | POA: Diagnosis not present

## 2015-04-27 DIAGNOSIS — M4854XD Collapsed vertebra, not elsewhere classified, thoracic region, subsequent encounter for fracture with routine healing: Secondary | ICD-10-CM | POA: Insufficient documentation

## 2015-04-27 DIAGNOSIS — S2232XD Fracture of one rib, left side, subsequent encounter for fracture with routine healing: Secondary | ICD-10-CM | POA: Diagnosis not present

## 2015-04-27 DIAGNOSIS — R8299 Other abnormal findings in urine: Secondary | ICD-10-CM | POA: Diagnosis not present

## 2015-04-27 DIAGNOSIS — T148XXA Other injury of unspecified body region, initial encounter: Secondary | ICD-10-CM

## 2015-04-27 DIAGNOSIS — S22008D Other fracture of unspecified thoracic vertebra, subsequent encounter for fracture with routine healing: Secondary | ICD-10-CM | POA: Diagnosis not present

## 2015-04-27 DIAGNOSIS — S22009D Unspecified fracture of unspecified thoracic vertebra, subsequent encounter for fracture with routine healing: Secondary | ICD-10-CM

## 2015-04-27 DIAGNOSIS — R82998 Other abnormal findings in urine: Secondary | ICD-10-CM

## 2015-04-27 LAB — POCT CBC
GRANULOCYTE PERCENT: 83.3 % — AB (ref 37–80)
HCT, POC: 50 % (ref 43.5–53.7)
Hemoglobin: 16.8 g/dL (ref 14.1–18.1)
Lymph, poc: 2.2 (ref 0.6–3.4)
MCH: 29.3 pg (ref 27–31.2)
MCHC: 33.6 g/dL (ref 31.8–35.4)
MCV: 87.1 fL (ref 80–97)
MID (cbc): 0.9 (ref 0–0.9)
MPV: 7.2 fL (ref 0–99.8)
PLATELET COUNT, POC: 363 10*3/uL (ref 142–424)
POC Granulocyte: 15.6 — AB (ref 2–6.9)
POC LYMPH PERCENT: 12 %L (ref 10–50)
POC MID %: 4.7 %M (ref 0–12)
RBC: 5.74 M/uL (ref 4.69–6.13)
RDW, POC: 14 %
WBC: 18.7 10*3/uL — AB (ref 4.6–10.2)

## 2015-04-27 LAB — COMPREHENSIVE METABOLIC PANEL
ALT: 30 U/L (ref 0–53)
AST: 17 U/L (ref 0–37)
Albumin: 4.6 g/dL (ref 3.5–5.2)
Alkaline Phosphatase: 66 U/L (ref 39–117)
BUN: 26 mg/dL — AB (ref 6–23)
CALCIUM: 9.7 mg/dL (ref 8.4–10.5)
CO2: 25 meq/L (ref 19–32)
CREATININE: 1.47 mg/dL — AB (ref 0.50–1.35)
Chloride: 95 mEq/L — ABNORMAL LOW (ref 96–112)
Glucose, Bld: 126 mg/dL — ABNORMAL HIGH (ref 70–99)
Potassium: 4.4 mEq/L (ref 3.5–5.3)
Sodium: 135 mEq/L (ref 135–145)
Total Bilirubin: 1.1 mg/dL (ref 0.2–1.2)
Total Protein: 8.1 g/dL (ref 6.0–8.3)

## 2015-04-27 MED ORDER — OXYCODONE-ACETAMINOPHEN 5-325 MG PO TABS: 1.0000 | ORAL_TABLET | ORAL | Status: DC | PRN
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
## 2015-04-27 NOTE — Patient Instructions (Addendum)
Practice deep breathing and coughing to help open up the lungs better  CT scan of spine and of chest are being ordered for today  Bring a fresh urine specimen back today or tomorrow since you have been unable to collect it.  In the event of getting abruptly worse go to the emergency room at any time or call 911 if necessary  Drink plenty of fluids. It is okay if you do not eat much, but you need to keep better hydrated.  Take some MiraLAX as needed to keep your bowels moving. Also consider some Colace stool softener. Return at any time if needed, otherwise return in 2 or 3 days if not improving.  Continue using her pain medication when needed. Represcribed the Percocet 5/325 #30.

## 2015-04-28 NOTE — Addendum Note (Signed)
Addended by: Constance Goltz on: 04/28/2015 09:05 AM   Modules accepted: Orders

## 2015-05-04 ENCOUNTER — Encounter: Payer: Self-pay | Admitting: Family Medicine

## 2016-07-11 DIAGNOSIS — I129 Hypertensive chronic kidney disease with stage 1 through stage 4 chronic kidney disease, or unspecified chronic kidney disease: Secondary | ICD-10-CM | POA: Diagnosis not present

## 2016-07-11 DIAGNOSIS — N182 Chronic kidney disease, stage 2 (mild): Secondary | ICD-10-CM | POA: Diagnosis not present

## 2016-07-11 DIAGNOSIS — R739 Hyperglycemia, unspecified: Secondary | ICD-10-CM | POA: Diagnosis not present

## 2016-07-18 DIAGNOSIS — I129 Hypertensive chronic kidney disease with stage 1 through stage 4 chronic kidney disease, or unspecified chronic kidney disease: Secondary | ICD-10-CM | POA: Diagnosis not present

## 2016-07-18 DIAGNOSIS — N182 Chronic kidney disease, stage 2 (mild): Secondary | ICD-10-CM | POA: Diagnosis not present

## 2016-07-18 DIAGNOSIS — E559 Vitamin D deficiency, unspecified: Secondary | ICD-10-CM | POA: Diagnosis not present

## 2016-07-18 DIAGNOSIS — M109 Gout, unspecified: Secondary | ICD-10-CM | POA: Diagnosis not present

## 2016-08-15 ENCOUNTER — Ambulatory Visit (INDEPENDENT_AMBULATORY_CARE_PROVIDER_SITE_OTHER): Payer: BLUE CROSS/BLUE SHIELD | Admitting: Urgent Care

## 2016-08-15 VITALS — BP 128/90 | HR 86 | Temp 98.4°F | Resp 17 | Ht 72.0 in | Wt 278.0 lb

## 2016-08-15 DIAGNOSIS — L989 Disorder of the skin and subcutaneous tissue, unspecified: Secondary | ICD-10-CM

## 2016-08-15 DIAGNOSIS — L980 Pyogenic granuloma: Secondary | ICD-10-CM

## 2016-08-15 NOTE — Progress Notes (Signed)
° °  By signing my name below, I, Mesha Guinyard, attest that this documentation has been prepared under the direction and in the presence of Jaynee Eagles, [place].  Electronically Signed: Verlee Monte, Medical Scribe. 08/15/16. 12:02 PM.  MRN: [id number] DOB: 01-15-1961  Subjective:   Gordon Green is a 55 y.o. male with a PMHx of HTN presenting for chief complaint of left hand puncture onset 2 months ago. He realized he had a non bleeding puncture wound from unknown source without pain when he was cutting rose bush and doing other yard work. Reports some tenderness after he noticed it and used tweezers to look for a foreign object inside but found nothing. The puncture wound closed up and noticed a growth similar to a "blood blister". States it burst yesterday morning with continuous bleeding, admits that it only bleeds like that when he "messes with it". States it's currently sensitive, but doesn't hurt. Mentions his blood sugar has been "borderline" in the past and he's had "scratches and cuts" on his legs that has completely healed. Denies history of diabetes. Denies taking blood thinners, warmth from the site, surrounding erythema, decreased ROM in his hand, and pain from site.  Powell is taking lisinopril-HCTZ, allopurinol, potassium gluconate, and omeprazole. Also has No Known Allergies.  Rhon  has a past medical history of Cancer ([place]) (02/2004); GERD (gastroesophageal reflux disease); Gout; Hyperlipidemia; and Hypertension. Also  has a past surgical history that includes Kidney surgery (02/2004); Fx: tibula; ORIF tibia plateau (Left, 08/25/2013); and Inguinal hernia repair.  Objective:   Vitals: BP 128/90 (BP Location: Right Arm, Patient Position: Sitting, Cuff Size: Large)    Pulse 86    Temp 98.4 F (36.9 C) (Oral)    Resp 17    Ht 6' (1.829 m)    Wt 278 lb (126.1 kg)    SpO2 97%    BMI 37.70 kg/m   Physical Exam  Constitutional: He is oriented to person, place, and time. He  appears well-developed and well-nourished.  Cardiovascular: Normal rate.   Pulmonary/Chest: Effort normal.  Musculoskeletal:  Left hand: nl ROM, no tenderness  Neurological: He is alert and oriented to person, place, and time.  Left hand: 5/5 strength  Skin: Lesion noted. No laceration noted.  Left Hand Lesion: ~0.5 cm small red papule at distal portion of palm between 3rd and 4th fingers   Assessment and Plan :   This case was precepted with Dr. Carlota Raspberry.   1. Pyogenic granuloma of skin 2. Hand lesion - Hemostasis achieved with silver nitrate, pressure dressing. Referral to dermatology for consult on surgical removal is pending.  Jaynee Eagles, PA-C [facility] [phone] 08/15/2016 12:02 PM

## 2016-08-15 NOTE — Patient Instructions (Signed)
     IF you received an x-ray today, you will receive an invoice from [facility]. Please contact [facility] at [phone] with questions or concerns regarding your invoice.   IF you received labwork today, you will receive an invoice from [facility]. Please contact [facility] at [phone] with questions or concerns regarding your invoice.   Our billing staff will not be able to assist you with questions regarding bills from these companies.  You will be contacted with the lab results as soon as they are available. The fastest way to get your results is to activate your My Chart account. Instructions are located on the last page of this paperwork. If you have not heard from us regarding the results in 2 weeks, please contact this office.      

## 2016-08-27 DIAGNOSIS — N200 Calculus of kidney: Secondary | ICD-10-CM | POA: Diagnosis not present

## 2016-08-27 DIAGNOSIS — K760 Fatty (change of) liver, not elsewhere classified: Secondary | ICD-10-CM | POA: Diagnosis not present

## 2016-08-27 DIAGNOSIS — Z6837 Body mass index (BMI) 37.0-37.9, adult: Secondary | ICD-10-CM | POA: Diagnosis not present

## 2016-08-27 DIAGNOSIS — Q600 Renal agenesis, unilateral: Secondary | ICD-10-CM | POA: Insufficient documentation

## 2016-08-27 DIAGNOSIS — IMO0002 Reserved for concepts with insufficient information to code with codable children: Secondary | ICD-10-CM | POA: Insufficient documentation

## 2016-08-27 DIAGNOSIS — N281 Cyst of kidney, acquired: Secondary | ICD-10-CM | POA: Diagnosis not present

## 2016-08-27 DIAGNOSIS — Z85528 Personal history of other malignant neoplasm of kidney: Secondary | ICD-10-CM | POA: Diagnosis not present

## 2016-12-14 ENCOUNTER — Ambulatory Visit (INDEPENDENT_AMBULATORY_CARE_PROVIDER_SITE_OTHER): Payer: BLUE CROSS/BLUE SHIELD | Admitting: Physician Assistant

## 2016-12-14 VITALS — BP 130/80 | HR 108 | Temp 98.8°F | Resp 17 | Ht 72.5 in | Wt 280.0 lb

## 2016-12-14 DIAGNOSIS — J111 Influenza due to unidentified influenza virus with other respiratory manifestations: Secondary | ICD-10-CM | POA: Diagnosis not present

## 2016-12-14 DIAGNOSIS — R059 Cough, unspecified: Secondary | ICD-10-CM

## 2016-12-14 DIAGNOSIS — R05 Cough: Secondary | ICD-10-CM | POA: Diagnosis not present

## 2016-12-14 DIAGNOSIS — R000 Tachycardia, unspecified: Secondary | ICD-10-CM | POA: Diagnosis not present

## 2016-12-14 MED ORDER — HYDROCOD POLST-CPM POLST ER 10-8 MG/5ML PO SUER: 5.0000 mL | Freq: Two times a day (BID) | ORAL | 0 refills | Status: DC | PRN

## 2016-12-14 MED ORDER — OSELTAMIVIR PHOSPHATE 75 MG PO CAPS: 75.0000 mg | ORAL_CAPSULE | Freq: Two times a day (BID) | ORAL | 0 refills | Status: DC

## 2016-12-14 MED ORDER — BENZONATATE 100 MG PO CAPS: 100.0000 mg | ORAL_CAPSULE | Freq: Three times a day (TID) | ORAL | 0 refills | Status: DC | PRN

## 2016-12-14 NOTE — Progress Notes (Signed)
MRN: [id number] DOB: 1961/08/31  Subjective:   Gordon Green is a 56 y.o. male presenting for chief complaint of Cough; URI; and Headache .  Reports 2 day history of Fever (100.3 this morning, resolved with tylenol), productive cough (no hemoptysis), chest tightness and myalgia, chills. States he was fine 2 days ago but then his whole body felt sore. Has tried tylenol and robitussin with moderate relief. States the productive cough has becoem more dry with cough syrup. Denies, sinus pain, rhinorrhea, itchy watery eyes, ear fullness and sore throat, nausea, vomiting, abdominal pain and diarrhea. Has had sick contact with multiple coworkers who have had the flu. No history of seasonal allergies or asthma. Patient did not have flu shot this season. No smoking, admits to occasional alcohol. Denies any other aggravating or relieving factors, no other questions or concerns.  Of note, pt has not consumed any water today.   Valerian has a current medication list which includes the following prescription(s): allopurinol, cholecalciferol, lisinopril-hydrochlorothiazide, niacin, omeprazole, and potassium gluconate. Also has No Known Allergies.  Gordon Green  has a past medical history of Cancer ([place]) (02/2004); GERD (gastroesophageal reflux disease); Gout; Hyperlipidemia; and Hypertension. Also  has a past surgical history that includes Kidney surgery (02/2004); Fx: tibula; ORIF tibia plateau (Left, 08/25/2013); and Inguinal hernia repair.   Objective:   Vitals: BP 130/80 (BP Location: Right Arm, Patient Position: Sitting, Cuff Size: Normal)   Pulse (!) 108   Temp 98.8 F (37.1 C) (Oral)   Resp 17   Ht 6' 0.5" (1.842 m)   Wt 280 lb (127 kg)   SpO2 97%   BMI 37.45 kg/m   Physical Exam  Constitutional: He is oriented to person, place, and time. He appears well-developed and well-nourished. He appears distressed (mild, sitting on exam table).  HENT:  Head: Normocephalic and atraumatic.  Right  Ear: Tympanic membrane, external ear and ear canal normal.  Left Ear: Tympanic membrane, external ear and ear canal normal.  Nose: Mucosal edema present. Right sinus exhibits no maxillary sinus tenderness and no frontal sinus tenderness. Left sinus exhibits no maxillary sinus tenderness and no frontal sinus tenderness.  Mouth/Throat: Uvula is midline and mucous membranes are normal. Posterior oropharyngeal erythema present.  Eyes: Conjunctivae are normal.  Neck: Normal range of motion.  Cardiovascular: Regular rhythm and normal heart sounds.  Tachycardia present.   Pulmonary/Chest: Effort normal and breath sounds normal.  Lymphadenopathy:       Head (right side): No submental, no submandibular, no tonsillar, no preauricular, no posterior auricular and no occipital adenopathy present.       Head (left side): No submental, no submandibular, no tonsillar, no preauricular, no posterior auricular and no occipital adenopathy present.    He has no cervical adenopathy.       Right: No supraclavicular adenopathy present.       Left: No supraclavicular adenopathy present.  Neurological: He is alert and oriented to person, place, and time.  Skin: Skin is warm. He is diaphoretic (mild ). No pallor.  Psychiatric: He has a normal mood and affect.  Vitals reviewed.    Pulse Readings from Last 3 Encounters:  12/14/16 (!) 108  08/15/16 86  04/27/15 91   No results found for this or any previous visit (from the past 24 hour(s)).  Assessment and Plan :  1. Influenza -Due to exposure to the flu and history and physical exam findings, will treat empirically for the flu. Pt instructed to return to clinic if  symptoms worsen, do not improve in 7-10 days, or as needed - oseltamivir (TAMIFLU) 75 MG capsule; Take 1 capsule (75 mg total) by mouth 2 (two) times daily.  Dispense: 10 capsule; Refill: 0  2. Cough - chlorpheniramine-HYDROcodone (TUSSIONEX PENNKINETIC ER) 10-8 MG/5ML SUER; Take 5 mLs by mouth every  12 (twelve) hours as needed for cough.  Dispense: 100 mL; Refill: 0 - benzonatate (TESSALON) 100 MG capsule; Take 1-2 capsules (100-200 mg total) by mouth 3 (three) times daily as needed for cough.  Dispense: 40 capsule; Refill: 0  3. Tachycardia -Likely due to illness and dehydration, pt instructed to hydrate with water as he has not had any fluids today and continue tylenol for fever.   Tenna Delaine, PA-C  [facility] 12/14/2016 1:46 PM

## 2016-12-14 NOTE — Patient Instructions (Addendum)
-Take tamiflu as prescribed. This should help decrease the severity of your symptoms and the duration of your illness.  - I recommend you rest, drink plenty of fluids, eat light meals including soups.  - You may use cough syrup at night for your cough and sore throat, Tessalon pearls during the day. Be aware that cough syrup can definitely make you drowsy and sleepy so do not drive or operate any heavy machinery if it is affecting you during the day.  - You may also use Tylenol  over-the-counter for your fever.  - Please let me know if you are not seeing any improvement or get worse in 7-10 days.    Influenza, Adult Influenza ("the flu") is an infection in the lungs, nose, and throat (respiratory tract). It is caused by a virus. The flu causes many common cold symptoms, as well as a high fever and body aches. It can make you feel very sick. The flu spreads easily from person to person (is contagious). Getting a flu shot (influenza vaccination) every year is the best way to prevent the flu. Follow these instructions at home:  Take over-the-counter and prescription medicines only as told by your doctor.  Use a cool mist humidifier to add moisture (humidity) to the air in your home. This can make it easier to breathe.  Rest as needed.  Drink enough fluid to keep your pee (urine) clear or pale yellow.  Cover your mouth and nose when you cough or sneeze.  Wash your hands with soap and water often, especially after you cough or sneeze. If you cannot use soap and water, use hand sanitizer.  Stay home from work or school as told by your doctor. Unless you are visiting your doctor, try to avoid leaving home until your fever has been gone for 24 hours without the use of medicine.  Keep all follow-up visits as told by your doctor. This is important. How is this prevented?  Getting a yearly (annual) flu shot is the best way to avoid getting the flu. You may get the flu shot in late summer, fall, or  winter. Ask your doctor when you should get your flu shot.  Wash your hands often or use hand sanitizer often.  Avoid contact with people who are sick during cold and flu season.  Eat healthy foods.  Drink plenty of fluids.  Get enough sleep.  Exercise regularly. Contact a doctor if:  You get new symptoms.  You have:  Chest pain.  Watery poop (diarrhea).  A fever.  Your cough gets worse.  You start to have more mucus.  You feel sick to your stomach (nauseous).  You throw up (vomit). Get help right away if:  You start to be short of breath or have trouble breathing.  Your skin or nails turn a bluish color.  You have very bad pain or stiffness in your neck.  You get a sudden headache.  You get sudden pain in your face or ear.  You cannot stop throwing up. This information is not intended to replace advice given to you by your health care provider. Make sure you discuss any questions you have with your health care provider. Document Released: 07/31/2008 Document Revised: 03/29/2016 Document Reviewed: 08/16/2015 Elsevier Interactive Patient Education  2017 [name].    IF you received an x-ray today, you will receive an invoice from [facility]. Please contact [facility] at [phone] with questions or concerns regarding your invoice.   IF you received  labwork today, you will receive an invoice from [facility]. Please contact [facility] at [phone] with questions or concerns regarding your invoice.   Our billing staff will not be able to assist you with questions regarding bills from these companies.  You will be contacted with the lab results as soon as they are available. The fastest way to get your results is to activate your My Chart account. Instructions are located on the last page of this paperwork. If you have not heard from [place] regarding the results in 2 weeks, please contact this office.

## 2017-02-26 DIAGNOSIS — Z6837 Body mass index (BMI) 37.0-37.9, adult: Secondary | ICD-10-CM | POA: Diagnosis not present

## 2017-02-26 DIAGNOSIS — Z85528 Personal history of other malignant neoplasm of kidney: Secondary | ICD-10-CM | POA: Diagnosis not present

## 2017-02-26 DIAGNOSIS — N281 Cyst of kidney, acquired: Secondary | ICD-10-CM | POA: Diagnosis not present

## 2017-02-26 DIAGNOSIS — N200 Calculus of kidney: Secondary | ICD-10-CM | POA: Diagnosis not present

## 2017-02-26 DIAGNOSIS — Q600 Renal agenesis, unilateral: Secondary | ICD-10-CM | POA: Diagnosis not present

## 2017-07-18 ENCOUNTER — Inpatient Hospital Stay
Admit: 2017-07-18 | Discharge: 2017-07-18 | Disposition: A | Discharge status: Home / Self Care | Attending: Emergency Medicine

## 2017-07-18 DIAGNOSIS — S161XXA Strain of muscle, fascia and tendon at neck level, initial encounter: Secondary | ICD-10-CM
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
## 2017-07-18 NOTE — Discharge Instructions (Signed)
Ok to take over the counter NSAIDs such as motrin 800 mg for pain as needed
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
## 2017-08-28 DIAGNOSIS — Z85528 Personal history of other malignant neoplasm of kidney: Secondary | ICD-10-CM | POA: Diagnosis not present

## 2017-08-28 DIAGNOSIS — N281 Cyst of kidney, acquired: Secondary | ICD-10-CM | POA: Diagnosis not present

## 2017-08-28 DIAGNOSIS — Z6838 Body mass index (BMI) 38.0-38.9, adult: Secondary | ICD-10-CM | POA: Diagnosis not present

## 2017-08-28 DIAGNOSIS — N200 Calculus of kidney: Secondary | ICD-10-CM | POA: Diagnosis not present

## 2017-08-28 DIAGNOSIS — Q600 Renal agenesis, unilateral: Secondary | ICD-10-CM | POA: Diagnosis not present

## 2017-10-30 ENCOUNTER — Ambulatory Visit: Payer: BLUE CROSS/BLUE SHIELD | Admitting: Physician Assistant

## 2017-10-30 ENCOUNTER — Encounter: Payer: Self-pay | Admitting: Physician Assistant

## 2017-10-30 ENCOUNTER — Other Ambulatory Visit: Payer: Self-pay

## 2017-10-30 VITALS — BP 110/72 | HR 109 | Temp 99.1°F | Resp 16 | Ht 72.5 in | Wt 276.2 lb

## 2017-10-30 DIAGNOSIS — J029 Acute pharyngitis, unspecified: Secondary | ICD-10-CM | POA: Diagnosis not present

## 2017-10-30 LAB — POCT RAPID STREP A (OFFICE): Rapid Strep A Screen: NEGATIVE

## 2017-10-30 MED ORDER — AMOXICILLIN 875 MG PO TABS: 875.0000 mg | ORAL_TABLET | Freq: Two times a day (BID) | ORAL | 0 refills | Status: AC

## 2017-10-30 NOTE — Progress Notes (Signed)
10/30/2017 4:31 PM   DOB: 1961-09-14 / MRN: [id number]  SUBJECTIVE:  Gordon Green is a 56 y.o. male presenting for sore throat that started about 2 days ago.  Associates cough, fatigue, and nasal congestion. He has a history of renal cancer which was treated with nephrectomy. He is no better or worse.   He has No Known Allergies.   He  has a past medical history of Cancer ([place]) (02/2004), GERD (gastroesophageal reflux disease), Gout, Hyperlipidemia, and Hypertension.    He  reports that he quit smoking about 13 years ago. His smoking use included cigarettes. he has never used smokeless tobacco. He reports that he drinks about 2.4 oz of alcohol per week. He reports that he does not use drugs. He  has no sexual activity history on file. The patient  has a past surgical history that includes Kidney surgery (02/2004); Fx: tibula; ORIF tibia plateau (Left, 08/25/2013); and Inguinal hernia repair.  His family history includes Heart disease in his father.  Review of Systems  Constitutional: Positive for fever. Negative for chills, diaphoresis, malaise/fatigue and weight loss.  HENT: Positive for congestion and sore throat.   Respiratory: Positive for cough and shortness of breath.   Cardiovascular: Negative for chest pain and leg swelling.  Gastrointestinal: Negative for abdominal pain, nausea and vomiting.  Skin: Negative for itching and rash.  Neurological: Negative for dizziness and weakness.    The problem list and medications were reviewed and updated by myself where necessary and exist elsewhere in the encounter.   OBJECTIVE:  BP 110/72 (BP Location: Right Arm, Patient Position: Sitting, Cuff Size: Large)   Pulse (!) 109   Temp 99.1 F (37.3 C) (Oral)   Resp 16   Ht 6' 0.5" (1.842 m)   Wt 276 lb 3.2 oz (125.3 kg)   SpO2 95%   BMI 36.94 kg/m   Pulse Readings from Last 3 Encounters:  10/30/17 (!) 109  12/14/16 (!) 108  08/15/16 86     Physical Exam    Constitutional: He appears well-developed. He is active and cooperative.  Non-toxic appearance.  Cardiovascular: Normal rate, regular rhythm, S1 normal, S2 normal, normal heart sounds, intact distal pulses and normal pulses. Exam reveals no gallop and no friction rub.  No murmur heard. Pulmonary/Chest: Effort normal. No stridor. No tachypnea. No respiratory distress. He has no wheezes. He has no rales.  Abdominal: He exhibits no distension.  Musculoskeletal: He exhibits no edema.  Neurological: He is alert.  Skin: Skin is warm and dry. He is not diaphoretic. No pallor.  Vitals reviewed.   Results for orders placed or performed in visit on 10/30/17 (from the past 72 hour(s))  POCT rapid strep A     Status: None   Collection Time: 10/30/17 11:22 AM  Result Value Ref Range   Rapid Strep A Screen Negative Negative    No results found.  ASSESSMENT AND PLAN:  [name] was seen today for sore throat.  Diagnoses and all orders for this visit:  Sore throat: Exam is benign.  Likely a viral in origin.  Abx given but advised he hold this for the next 8-10 days and fill only if not improving or getting worse. He will try zyrtec-d and aleve for now.  -     POCT rapid strep A -     Culture, Group A Strep    The patient is advised to call or return to clinic if he does not see an improvement in  symptoms, or to seek the care of the closest emergency department if he worsens with the above plan.   Philis Fendt, MHS, PA-C [facility] 10/30/2017 4:31 PM

## 2017-10-30 NOTE — Patient Instructions (Signed)
Try zyrtec-d and Aleve for now.

## 2017-11-01 LAB — CULTURE, GROUP A STREP: Strep A Culture: NEGATIVE

## 2017-11-02 NOTE — Progress Notes (Signed)
Please make patient aware of results via letter. In the context of his overall presentation any abnormal values are of no clinical significance.  Philis Fendt PA-C, 11/02/2017 1:24 PM

## 2017-11-06 DIAGNOSIS — R739 Hyperglycemia, unspecified: Secondary | ICD-10-CM | POA: Diagnosis not present

## 2017-11-06 DIAGNOSIS — E559 Vitamin D deficiency, unspecified: Secondary | ICD-10-CM | POA: Diagnosis not present

## 2017-11-06 DIAGNOSIS — N182 Chronic kidney disease, stage 2 (mild): Secondary | ICD-10-CM | POA: Diagnosis not present

## 2017-11-06 DIAGNOSIS — E785 Hyperlipidemia, unspecified: Secondary | ICD-10-CM | POA: Diagnosis not present

## 2017-11-06 DIAGNOSIS — M109 Gout, unspecified: Secondary | ICD-10-CM | POA: Diagnosis not present

## 2017-11-07 ENCOUNTER — Encounter: Payer: Self-pay | Admitting: Radiology

## 2017-11-12 ENCOUNTER — Emergency Department (HOSPITAL_COMMUNITY)
Admission: EM | Admit: 2017-11-12 | Discharge: 2017-11-13 | Disposition: A | Discharge status: Home / Self Care | Payer: BLUE CROSS/BLUE SHIELD | Attending: Emergency Medicine | Admitting: Emergency Medicine

## 2017-11-12 ENCOUNTER — Other Ambulatory Visit: Payer: Self-pay

## 2017-11-12 ENCOUNTER — Encounter (HOSPITAL_COMMUNITY): Payer: Self-pay | Admitting: Emergency Medicine

## 2017-11-12 DIAGNOSIS — R739 Hyperglycemia, unspecified: Secondary | ICD-10-CM | POA: Insufficient documentation

## 2017-11-12 DIAGNOSIS — E785 Hyperlipidemia, unspecified: Secondary | ICD-10-CM | POA: Diagnosis not present

## 2017-11-12 DIAGNOSIS — I129 Hypertensive chronic kidney disease with stage 1 through stage 4 chronic kidney disease, or unspecified chronic kidney disease: Secondary | ICD-10-CM | POA: Diagnosis not present

## 2017-11-12 DIAGNOSIS — Z87891 Personal history of nicotine dependence: Secondary | ICD-10-CM | POA: Insufficient documentation

## 2017-11-12 DIAGNOSIS — E119 Type 2 diabetes mellitus without complications: Secondary | ICD-10-CM | POA: Diagnosis not present

## 2017-11-12 DIAGNOSIS — Z79899 Other long term (current) drug therapy: Secondary | ICD-10-CM | POA: Insufficient documentation

## 2017-11-12 DIAGNOSIS — Z85528 Personal history of other malignant neoplasm of kidney: Secondary | ICD-10-CM | POA: Diagnosis not present

## 2017-11-12 DIAGNOSIS — I10 Essential (primary) hypertension: Secondary | ICD-10-CM | POA: Diagnosis not present

## 2017-11-12 DIAGNOSIS — N182 Chronic kidney disease, stage 2 (mild): Secondary | ICD-10-CM | POA: Diagnosis not present

## 2017-11-12 LAB — CBC
HCT: 42.2 % (ref 39.0–52.0)
HEMOGLOBIN: 15.4 g/dL (ref 13.0–17.0)
MCH: 31.4 pg (ref 26.0–34.0)
MCHC: 36.5 g/dL — AB (ref 30.0–36.0)
MCV: 86.1 fL (ref 78.0–100.0)
Platelets: 273 10*3/uL (ref 150–400)
RBC: 4.9 MIL/uL (ref 4.22–5.81)
RDW: 13.9 % (ref 11.5–15.5)
WBC: 10.3 10*3/uL (ref 4.0–10.5)

## 2017-11-12 LAB — BASIC METABOLIC PANEL
ANION GAP: 15 (ref 5–15)
BUN: 27 mg/dL — ABNORMAL HIGH (ref 6–20)
CO2: 24 mmol/L (ref 22–32)
Calcium: 9.9 mg/dL (ref 8.9–10.3)
Chloride: 88 mmol/L — ABNORMAL LOW (ref 101–111)
Creatinine, Ser: 1.24 mg/dL (ref 0.61–1.24)
Glucose, Bld: 409 mg/dL — ABNORMAL HIGH (ref 65–99)
Potassium: 4.3 mmol/L (ref 3.5–5.1)
SODIUM: 127 mmol/L — AB (ref 135–145)

## 2017-11-12 LAB — URINALYSIS, ROUTINE W REFLEX MICROSCOPIC
Bilirubin Urine: NEGATIVE
Glucose, UA: >=500 mg/dL — AB
Ketones, ur: 20 mg/dL — AB
Leukocytes, UA: NEGATIVE
Nitrite: NEGATIVE
Protein, ur: 100 mg/dL — AB
SQUAMOUS EPITHELIAL / LPF: NONE SEEN
Specific Gravity, Urine: 1.026 (ref 1.005–1.030)
pH: 5 (ref 5.0–8.0)

## 2017-11-12 LAB — CBG MONITORING, ED: Glucose-Capillary: 446 mg/dL — ABNORMAL HIGH (ref 65–99)

## 2017-11-12 MED ORDER — SODIUM CHLORIDE 0.9 % IV BOLUS (SEPSIS)
2000.0000 mL | Freq: Once | INTRAVENOUS | Status: AC
  Administered 2017-11-13: 2000 mL via INTRAVENOUS

## 2017-11-12 MED ORDER — INSULIN ASPART 100 UNIT/ML ~~LOC~~ SOLN
10.0000 [IU] | Freq: Once | SUBCUTANEOUS | Status: AC
  Administered 2017-11-13: 10 [IU] via SUBCUTANEOUS
  Filled 2017-11-12: qty 1

## 2017-11-12 MED ORDER — METFORMIN HCL 500 MG PO TABS
1000.0000 mg | ORAL_TABLET | Freq: Once | ORAL | Status: AC
  Administered 2017-11-13: 1000 mg via ORAL
  Filled 2017-11-12: qty 2

## 2017-11-12 NOTE — ED Triage Notes (Addendum)
Pt to ED with c/o hyperglycemia.  Pt has no hx of diabetes.  Pt c/o urinary frequency and blurry vision

## 2017-11-12 NOTE — ED Provider Notes (Signed)
Patient placed in Quick Look pathway, seen and evaluated for chief complaint of hyperglycemia. Sent by Nephrologist. Pertinent H&P findings include polyuria, polydipsia. Blurred vision. No CP/SOB/ABD pain.  Based on initial evaluation, labs are indicated and radiology studies are not indicated.  Patient counseled on process, plan, and necessity for staying for completing the evaluation.    Shary Decamp, PA-C 11/12/17 1622

## 2017-11-12 NOTE — ED Notes (Signed)
Results reviewed, awaiting BMP result

## 2017-11-13 LAB — HEPATIC FUNCTION PANEL
ALBUMIN: 4.2 g/dL (ref 3.5–5.0)
ALT: 41 U/L (ref 17–63)
AST: 38 U/L (ref 15–41)
Alkaline Phosphatase: 79 U/L (ref 38–126)
Bilirubin, Direct: 0.2 mg/dL (ref 0.1–0.5)
Indirect Bilirubin: 1.5 mg/dL — ABNORMAL HIGH (ref 0.3–0.9)
TOTAL PROTEIN: 7.8 g/dL (ref 6.5–8.1)
Total Bilirubin: 1.7 mg/dL — ABNORMAL HIGH (ref 0.3–1.2)

## 2017-11-13 LAB — HEMOGLOBIN A1C
HEMOGLOBIN A1C: 11.6 % — AB (ref 4.8–5.6)
Mean Plasma Glucose: 286.22 mg/dL

## 2017-11-13 LAB — CBG MONITORING, ED: GLUCOSE-CAPILLARY: 354 mg/dL — AB (ref 65–99)

## 2017-11-13 LAB — TSH: TSH: 7.464 u[IU]/mL — AB (ref 0.350–4.500)

## 2017-11-13 MED ORDER — METFORMIN HCL 1000 MG PO TABS: 1000.0000 mg | ORAL_TABLET | Freq: Two times a day (BID) | ORAL | 0 refills | Status: DC

## 2017-11-13 MED ORDER — BLOOD GLUCOSE METER KIT: PACK | 0 refills | Status: AC
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
## 2017-12-11 ENCOUNTER — Telehealth: Payer: Self-pay | Admitting: Endocrinology

## 2017-12-11 NOTE — Telephone Encounter (Signed)
He should get this from his PCP in the meantime, appointment is for 2/13

## 2017-12-11 NOTE — Telephone Encounter (Signed)
I do not see where this is a patient at our office? Please advise.

## 2017-12-11 NOTE — Telephone Encounter (Signed)
Pt stated he was in the hospital about a month ago and they perscribed him  metFORMIN (GLUCOPHAGE) 1000 MG tablet   He has two pills left and would like to know if our dr could prescribe this medication for him till he appt in march(new patient appt)   Please advise    [id number] - [place], [place] [address]

## 2017-12-11 NOTE — Telephone Encounter (Signed)
Called patient and let him know that since he has not physically been in our office for evaluation from Dr. Dwyane Dee he will need to get this from his PCP and then once he comes for his appointment on 2/13 then we can go from there.

## 2017-12-13 ENCOUNTER — Other Ambulatory Visit: Payer: Self-pay | Admitting: Physician Assistant

## 2017-12-13 ENCOUNTER — Other Ambulatory Visit: Payer: Self-pay

## 2017-12-13 ENCOUNTER — Ambulatory Visit: Payer: BLUE CROSS/BLUE SHIELD | Admitting: Physician Assistant

## 2017-12-13 ENCOUNTER — Encounter: Payer: Self-pay | Admitting: Physician Assistant

## 2017-12-13 VITALS — BP 100/60 | HR 90 | Resp 16 | Ht 72.5 in | Wt 253.6 lb

## 2017-12-13 DIAGNOSIS — E118 Type 2 diabetes mellitus with unspecified complications: Secondary | ICD-10-CM

## 2017-12-13 DIAGNOSIS — E119 Type 2 diabetes mellitus without complications: Secondary | ICD-10-CM | POA: Diagnosis not present

## 2017-12-13 DIAGNOSIS — E1169 Type 2 diabetes mellitus with other specified complication: Secondary | ICD-10-CM | POA: Insufficient documentation

## 2017-12-13 LAB — POCT URINALYSIS DIP (MANUAL ENTRY)
Bilirubin, UA: NEGATIVE
Blood, UA: NEGATIVE
GLUCOSE UA: NEGATIVE mg/dL
Leukocytes, UA: NEGATIVE
Nitrite, UA: NEGATIVE
Protein Ur, POC: 100 mg/dL — AB
SPEC GRAV UA: 1.02 (ref 1.010–1.025)
Urobilinogen, UA: 0.2 E.U./dL
pH, UA: 5.5 (ref 5.0–8.0)

## 2017-12-13 LAB — POCT GLYCOSYLATED HEMOGLOBIN (HGB A1C): HEMOGLOBIN A1C: 9

## 2017-12-13 MED ORDER — METFORMIN HCL 1000 MG PO TABS: 1000.0000 mg | ORAL_TABLET | Freq: Two times a day (BID) | ORAL | 3 refills | Status: DC

## 2017-12-13 NOTE — Progress Notes (Signed)
12/13/2017 10:27 AM   DOB: [birth year] / MRN: [id number]  SUBJECTIVE:  Gordon Green is a 57 y.o. male presenting for metformin refills.  He was diagnosed with diabetes at his nephrology office in [facility] and was advised to go straight to the ED where he was started on metformin.  He has lost over 20 pounds since that time.  He feels well today and is taking at thousand milligrams of metformin daily.  He is avoiding carbohydrates, snacks, sugary drinks.  He tells me he feels much better today.  He has No Known Allergies.   He  has a past medical history of Cancer ([place]) (02/2004), GERD (gastroesophageal reflux disease), Gout, Hyperlipidemia, and Hypertension.    He  reports that he quit smoking about 13 years ago. His smoking use included cigarettes. he has never used smokeless tobacco. He reports that he drinks about 2.4 oz of alcohol per week. He reports that he does not use drugs. He  has no sexual activity history on file. The patient  has a past surgical history that includes Kidney surgery (02/2004); Fx: tibula; ORIF tibia plateau (Left, 08/25/2013); and Inguinal hernia repair.  His family history includes Heart disease in his father.  Review of Systems  Constitutional: Negative for chills, diaphoresis and fever.  Eyes: Negative.   Respiratory: Negative for shortness of breath.   Cardiovascular: Negative for chest pain, orthopnea and leg swelling.  Gastrointestinal: Negative for abdominal pain, blood in stool, constipation, diarrhea, heartburn, melena, nausea and vomiting.  Genitourinary: Negative for dysuria, flank pain, frequency, hematuria and urgency.  Skin: Negative for rash.  Neurological: Negative for dizziness, sensory change, speech change, focal weakness and headaches.    The problem list and medications were reviewed and updated by myself where necessary and exist elsewhere in the encounter.   OBJECTIVE:  BP 100/60 (BP Location: Left Arm, Patient  Position: Sitting, Cuff Size: Large)   Pulse 90   Resp 16   Ht 6' 0.5" (1.842 m)   Wt 253 lb 9.6 oz (115 kg)   SpO2 98%   BMI 33.92 kg/m   Wt Readings from Last 3 Encounters:  12/13/17 253 lb 9.6 oz (115 kg)  10/30/17 276 lb 3.2 oz (125.3 kg)  12/14/16 280 lb (127 kg)     Physical Exam  Constitutional: He is oriented to person, place, and time. He appears well-developed. He is active and cooperative.  Non-toxic appearance.  Eyes: EOM are normal. Pupils are equal, round, and reactive to light.  Cardiovascular: Normal rate, regular rhythm, S1 normal, S2 normal, normal heart sounds, intact distal pulses and normal pulses. Exam reveals no gallop and no friction rub.  No murmur heard. Pulmonary/Chest: Effort normal. No stridor. No tachypnea. No respiratory distress. He has no wheezes. He has no rales.  Abdominal: He exhibits no distension.  Musculoskeletal: He exhibits no edema.  Neurological: He is alert and oriented to person, place, and time. He has normal strength and normal reflexes. He is not disoriented. No cranial nerve deficit or sensory deficit. He exhibits normal muscle tone. Coordination and gait normal.  Skin: Skin is warm and dry. He is not diaphoretic. No pallor.  Psychiatric: His behavior is normal.  Vitals reviewed.   Results for orders placed or performed in visit on 12/13/17 (from the past 72 hour(s))  POCT urinalysis dipstick     Status: Abnormal   Collection Time: 12/13/17 10:15 AM  Result Value Ref Range   Color, UA yellow yellow  Clarity, UA clear clear   Glucose, UA negative negative mg/dL   Bilirubin, UA negative negative   Ketones, POC UA small (15) (A) negative mg/dL   Spec Grav, UA 1.020 1.010 - 1.025   Blood, UA negative negative   pH, UA 5.5 5.0 - 8.0   Protein Ur, POC =100 (A) negative mg/dL   Urobilinogen, UA 0.2 0.2 or 1.0 E.U./dL   Nitrite, UA Negative Negative   Leukocytes, UA Negative Negative  POCT glycosylated hemoglobin (Hb A1C)      Status: None   Collection Time: 12/13/17 10:17 AM  Result Value Ref Range   Hemoglobin A1C 9.0     No results found.  ASSESSMENT AND PLAN:  Gordon Green was seen today for medication refill.  Diagnoses and all orders for this visit:  Type 2 diabetes mellitus without complication, without long-term current use of insulin [facility]): He has an appointment with endocrinology scheduled next month.  This was as quickly as he could get in.  His A1c has dropped rather dramatically as has his weight.  I have encouraged him to continue with the lifestyle changes as well as metformin at 2000 mg daily.  Renal and liver function tests are pending.  I will screen his cholesterol today and I am certain his ASCVD score which showed a risk benefit ratio leaning treat.  I will likely start him on 10 mg of Crestor daily.  He is not a smoker fortunately. -     POCT urinalysis dipstick -     POCT glycosylated hemoglobin (Hb A1C) -     Lipid panel -     Hepatic Function Panel -     Renal Function Panel -     Microalbumin, urine  Other orders -     metFORMIN (GLUCOPHAGE) 1000 MG tablet; Take 1 tablet (1,000 mg total) by mouth 2 (two) times daily with a meal.    The patient is advised to call or return to clinic if he does not see an improvement in symptoms, or to seek the care of the closest emergency department if he worsens with the above plan.   Philis Fendt, MHS, PA-C [facility] 12/13/2017 10:27 AM

## 2017-12-13 NOTE — Patient Instructions (Addendum)
  You have lost 23 pounds since 10/30/2017.  Your A1c is now 9 this is amazing progress.Marland Kitchen Keep up the good work.  I look forward to seeing what endocrinology has to say.  I am screening her cholesterol today as well as your kidneys for any diabetic related kidney damage.  Keep taking the Metformin.  There is a good chance that you may not need more medication than metformin.   IF you received an x-ray today, you will receive an invoice from [facility]. Please contact [facility] at [phone] with questions or concerns regarding your invoice.   IF you received labwork today, you will receive an invoice from [facility]. Please contact [facility] at [phone] with questions or concerns regarding your invoice.   Our billing staff will not be able to assist you with questions regarding bills from these companies.  You will be contacted with the lab results as soon as they are available. The fastest way to get your results is to activate your My Chart account. Instructions are located on the last page of this paperwork. If you have not heard from [place] regarding the results in 2 weeks, please contact this office.

## 2017-12-14 LAB — LIPID PANEL
Chol/HDL Ratio: 9.1 ratio — ABNORMAL HIGH (ref 0.0–5.0)
Cholesterol, Total: 209 mg/dL — ABNORMAL HIGH (ref 100–199)
HDL: 23 mg/dL — AB (ref >39)
TRIGLYCERIDES: 801 mg/dL — AB (ref 0–149)

## 2017-12-14 LAB — HEPATIC FUNCTION PANEL
ALK PHOS: 76 IU/L (ref 39–117)
ALT: 47 IU/L — ABNORMAL HIGH (ref 0–44)
AST: 30 IU/L (ref 0–40)
BILIRUBIN TOTAL: 0.4 mg/dL (ref 0.0–1.2)
BILIRUBIN, DIRECT: 0.15 mg/dL (ref 0.00–0.40)
TOTAL PROTEIN: 7.6 g/dL (ref 6.0–8.5)

## 2017-12-14 LAB — RENAL FUNCTION PANEL
ALBUMIN: 4.7 g/dL (ref 3.5–5.5)
BUN/Creatinine Ratio: 23 — ABNORMAL HIGH (ref 9–20)
BUN: 27 mg/dL — AB (ref 6–24)
CHLORIDE: 97 mmol/L (ref 96–106)
CO2: 21 mmol/L (ref 20–29)
CREATININE: 1.18 mg/dL (ref 0.76–1.27)
Calcium: 9.9 mg/dL (ref 8.7–10.2)
GFR calc non Af Amer: 69 mL/min/{1.73_m2} (ref >59)
GFR, EST AFRICAN AMERICAN: 80 mL/min/{1.73_m2} (ref >59)
Glucose: 102 mg/dL — ABNORMAL HIGH (ref 65–99)
Phosphorus: 3.3 mg/dL (ref 2.5–4.5)
Potassium: 4.6 mmol/L (ref 3.5–5.2)
Sodium: 137 mmol/L (ref 134–144)

## 2017-12-14 LAB — MICROALBUMIN, URINE: Microalbumin, Urine: 254.5 ug/mL

## 2017-12-16 ENCOUNTER — Other Ambulatory Visit: Payer: Self-pay | Admitting: Physician Assistant

## 2017-12-16 MED ORDER — ROSUVASTATIN CALCIUM 40 MG PO TABS: 20.0000 mg | ORAL_TABLET | Freq: Every day | ORAL | 3 refills | Status: DC

## 2017-12-17 ENCOUNTER — Encounter: Payer: Self-pay | Admitting: Endocrinology

## 2017-12-17 NOTE — Progress Notes (Signed)
Patient ID: Gordon Green, male   DOB: [birth year], 57 y.o.   MRN: [id number]          Reason for Appointment: Consultation for Type 2 Diabetes  Referring physician: Venora Maples   History of Present Illness:          Date of diagnosis of type 2 diabetes mellitus: 11/2017         Recent history:         He had presented to his urgent care center with symptoms of excessive thirst, urination, fatigue and blurred vision starting in late December following a significant respiratory infection His blood sugar was markedly increased and he was referred for admission, however was treated in the emergency room for his blood sugar of 409 with IV fluids and an injection of insulin.  He did not have any renal function abnormalities or acidosis He was sent home on metformin 1 g twice a day  His baseline A1c at diagnosis was 11.6  Non-insulin hypoglycemic drugs the patient is taking are: Metformin 1 g twice a day  Current management, blood sugar patterns and problems identified:  He has been continued on metformin 1 g twice a day  He does tend to have some nausea or a couple of hours later after taking his morning metformin but not in the evening  He has not had any diabetes education and is asking for more information today  On his own he has cut back severely on his carbohydrate intake and only carbohydrate he may get his occasional crackers or foods like beans  He also has cut back on portions and is eating more salads  With this he has lost a significant amount of weight  At home his blood sugars have become progressively better although still about 120 in the morning and somewhat lower in the evening before dinner  He does not check readings after dinner or other meals           Side effects from medications have been: Nausea with 1 g metformin in the morning     Glucose monitoring:  done 2  times a day         Glucometer:  Accu-Chek .      Blood Glucose readings by time of day and  averages from meter download:  PREMEAL Breakfast Lunch Dinner Bedtime  Overall   Glucose range: 114-207   96-187     Median:  131   118   124    POST-MEAL PC Breakfast PC Lunch PC Dinner  Glucose range:   ?    Median:      Self-care: The diet that the patient has been following is: tries to limit carbohydrates .      Typical meal intake: Breakfast is ham, egg               Dietician visit, most recent: None               Exercise:  minimal  Weight history:  Wt Readings from Last 3 Encounters:  12/18/17 254 lb 6.4 oz (115.4 kg)  12/13/17 253 lb 9.6 oz (115 kg)  10/30/17 276 lb 3.2 oz (125.3 kg)    Glycemic control:   Lab Results  Component Value Date   HGBA1C 9.0 12/13/2017   HGBA1C 11.6 (H) 11/12/2017   Lab Results  Component Value Date   LDLCALC Comment 12/13/2017   CREATININE 1.18 12/13/2017   No results found for: MICRALBCREAT  No results  found for: FRUCTOSAMINE    Allergies as of 12/18/2017   No Known Allergies     Medication List        Accurate as of 12/18/17  5:39 PM. Always use your most recent med list.          acetaminophen 500 MG tablet Commonly known as:  TYLENOL Take 500 mg by mouth every 6 (six) hours as needed for mild pain, fever or headache.   allopurinol 300 MG tablet Commonly known as:  ZYLOPRIM Take 300 mg by mouth daily.   blood glucose meter kit and supplies Please check your blood pressure twice a day and keep a log.   COUGH/COLD MEDICINE PO Take 1 tablet by mouth every 6 (six) hours as needed (for symptoms).   lisinopril-hydrochlorothiazide 20-12.5 MG tablet Commonly known as:  PRINZIDE,ZESTORETIC Take 1 tablet by mouth daily.   Magnesium 500 MG Tabs Take 500 mg by mouth daily.   metFORMIN 1000 MG tablet Commonly known as:  GLUCOPHAGE Take 1 tablet (1,000 mg total) by mouth 2 (two) times daily with a meal.   niacin 250 MG tablet Take 500 mg by mouth daily with breakfast.   omeprazole 20 MG tablet Commonly  known as:  PRILOSEC OTC Take 20 mg by mouth daily.   Potassium Gluconate 595 MG Caps Take 595 mg by mouth daily.   rosuvastatin 40 MG tablet Commonly known as:  CRESTOR Take 0.5-1 tablets (20-40 mg total) by mouth daily. Start with one half tab for 1 week then increase to the full tab.  Take in the evenings.   VITAMIN C PO Take 1 tablet by mouth daily.   Vitamin D (Ergocalciferol) 50000 units Caps capsule Commonly known as:  DRISDOL Take 50,000 units by mouth once a week on Saturdays   VITAMIN D-3 PO Take 1 capsule by mouth daily.       Allergies: No Known Allergies  Past Medical History:  Diagnosis Date  . Cancer ([place]) 02/2004   ;Hx: of right kidney cancer; kidney removed in 2005  . GERD (gastroesophageal reflux disease)   . Gout    Hx: of  . Hyperlipidemia   . Hypertension     Past Surgical History:  Procedure Laterality Date  . Fx: [place] fx:  Marland Kitchen INGUINAL HERNIA REPAIR     age 74  . KIDNEY SURGERY  02/2004   Hx; of removal of right kidnety in 2005  . ORIF TIBIA PLATEAU Left 08/25/2013   Procedure: OPEN REDUCTION INTERNAL FIXATION (ORIF) TIBIAL PLATEAU;  Surgeon: Renette Butters, MD;  Location: [facility];  Service: Orthopedics;  Laterality: Left;    Family History  Problem Relation Age of Onset  . Heart disease Father   . Hyperlipidemia Brother   . Colon cancer Neg Hx   . Esophageal cancer Neg Hx   . Prostate cancer Neg Hx   . Rectal cancer Neg Hx   . Stomach cancer Neg Hx   . Diabetes Neg Hx     Social History:  reports that he quit smoking about 13 years ago. His smoking use included cigarettes. he has never used smokeless tobacco. He reports that he drinks about 2.4 oz of alcohol per week. He reports that he does not use drugs.   Review of Systems  Constitutional: Positive for weight loss. Negative for reduced appetite.  Eyes: Negative for blurred vision.  Respiratory: Negative for shortness of breath.   Cardiovascular: Negative for leg  swelling.  Gastrointestinal: Positive for nausea.  Endocrine: Negative for fatigue and polydipsia.  Genitourinary: Negative for nocturia.  Musculoskeletal: Negative for joint pain.  Skin: Negative for rash.  Neurological: Negative for numbness and tingling.     Lipid history: He has just started taking Crestor for high triglycerides, no LDL available He thinks he may have had high triglycerides previously also    Lab Results  Component Value Date   CHOL 209 (H) 12/13/2017   HDL 23 (L) 12/13/2017   LDLCALC Comment 12/13/2017   TRIG 801 (HH) 12/13/2017   CHOLHDL 9.1 (H) 12/13/2017           Hypertension: On treatment since 2005 and has been patient of her nephrologist in another town  BP Readings from Last 3 Encounters:  12/18/17 122/70  12/13/17 100/60  11/13/17 122/82    Most recent eye exam: None  Most recent foot exam: 1/19    LABS:  Office Visit on 12/13/2017  Component Date Value Ref Range Status  . Color, UA 12/13/2017 yellow  yellow Final  . Clarity, UA 12/13/2017 clear  clear Final  . Glucose, UA 12/13/2017 negative  negative mg/dL Final  . Bilirubin, UA 12/13/2017 negative  negative Final  . Ketones, POC UA 12/13/2017 small (15)* negative mg/dL Final  . Spec Grav, UA 12/13/2017 1.020  1.010 - 1.025 Final  . Blood, UA 12/13/2017 negative  negative Final  . pH, UA 12/13/2017 5.5  5.0 - 8.0 Final  . Protein Ur, POC 12/13/2017 =100* negative mg/dL Final  . Urobilinogen, UA 12/13/2017 0.2  0.2 or 1.0 E.U./dL Final  . Nitrite, UA 12/13/2017 Negative  Negative Final  . Leukocytes, UA 12/13/2017 Negative  Negative Final  . Hemoglobin A1C 12/13/2017 9.0   Final  . Total Protein 12/13/2017 7.6  6.0 - 8.5 g/dL Final  . Bilirubin Total 12/13/2017 0.4  0.0 - 1.2 mg/dL Final  . Bilirubin, Direct 12/13/2017 0.15  0.00 - 0.40 mg/dL Final  . Alkaline Phosphatase 12/13/2017 76  39 - 117 IU/L Final  . AST 12/13/2017 30  0 - 40 IU/L Final  . ALT 12/13/2017 47* 0 - 44  IU/L Final  . Glucose 12/13/2017 102* 65 - 99 mg/dL Final  . BUN 12/13/2017 27* 6 - 24 mg/dL Final  . Creatinine, Ser 12/13/2017 1.18  0.76 - 1.27 mg/dL Final  . GFR calc non Af Amer 12/13/2017 69  >59 mL/min/1.73 Final  . GFR calc Af Amer 12/13/2017 80  >59 mL/min/1.73 Final  . BUN/Creatinine Ratio 12/13/2017 23* 9 - 20 Final  . Sodium 12/13/2017 137  134 - 144 mmol/L Final  . Potassium 12/13/2017 4.6  3.5 - 5.2 mmol/L Final  . Chloride 12/13/2017 97  96 - 106 mmol/L Final  . CO2 12/13/2017 21  20 - 29 mmol/L Final  . Calcium 12/13/2017 9.9  8.7 - 10.2 mg/dL Final  . Phosphorus 12/13/2017 3.3  2.5 - 4.5 mg/dL Final  . Albumin 12/13/2017 4.7  3.5 - 5.5 g/dL Final  . Microalbumin, Urine 12/13/2017 254.5  Not Estab. ug/mL Final  . Cholesterol, Total 12/13/2017 209* 100 - 199 mg/dL Final  . Triglycerides 12/13/2017 801* 0 - 149 mg/dL Final  . HDL 12/13/2017 23* >39 mg/dL Final  . VLDL Cholesterol Cal 12/13/2017 Comment  5 - 40 mg/dL Final   Comment: The calculation for the VLDL cholesterol is not valid when triglyceride level is >400 mg/dL.   Marland Kitchen LDL Calculated 12/13/2017 Comment  0 - 99 mg/dL Final  Comment: Triglyceride result indicated is too high for an accurate LDL cholesterol estimation.   . Chol/HDL Ratio 12/13/2017 9.1* 0.0 - 5.0 ratio Final   Comment:                                   T. Chol/HDL Ratio                                             Men  Women                               1/2 Avg.Risk  3.4    3.3                                   Avg.Risk  5.0    4.4                                2X Avg.Risk  9.6    7.1                                3X Avg.Risk 23.4   11.0     Physical Examination:  BP 122/70 (BP Location: Left Arm, Patient Position: Sitting, Cuff Size: Large)   Pulse 78   Ht 6' 0.5" (1.842 m)   Wt 254 lb 6.4 oz (115.4 kg)   SpO2 97%   BMI 34.03 kg/m   GENERAL:         Patient has obesity.    HEENT:         Eye exam shows normal external  appearance.  Fundus exam shows no retinopathy.  Oral exam shows normal mucosa .  NECK:   There is no lymphadenopathy Thyroid is not enlarged and no nodules felt.  Carotids are normal to palpation and no bruit heard  LUNGS:         Chest is symmetrical. Lungs are clear to auscultation.Marland Kitchen   HEART:         Heart sounds:  S1 and S2 are normal. No murmur or click heard., no S3 or S4.   ABDOMEN:   There is no distention present. Liver and spleen are not palpable. No other mass or tenderness present.    NEUROLOGICAL:   Ankle jerks are 1+ on the right and absent on the left .    Diabetic Foot Exam - Simple   Simple Foot Form Diabetic Foot exam was performed with the following findings:  Yes   Visual Inspection See comments:  Yes Sensation Testing Intact to touch and monofilament testing bilaterally:  Yes Pulse Check See comments:  Yes Comments Hammer toes on the left Pedal pulses not palpable            Vibration sense is markedly reduced in distal first toes. MUSCULOSKELETAL:  he has hammer toes on the left first 3 toes    EXTREMITIES:     There is no edema.  SKIN:       No rash or lesions of concern.        ASSESSMENT:  Diabetes type 2, recently diagnosed with relatively acute onset of symptomatic hyperglycemia  He has done very well with just metformin and lifestyle changes with diet even though his onset was significant with blood sugar of 409 and significant symptoms He is doing very well with low carbohydrate low fat and reduced calorie diet with progressive weight loss Blood sugars before meals are close to normal although still relatively high in the morning He does have some nausea with the morning dose of metformin but not in the evening  Complications of diabetes:?  Early neuropathy with decreased vibration sense but asymptomatic He does have a history of proteinuria previously which may be unrelated  HYPERTRIGLYCERIDEMIA: He had nonfasting triglycerides of about 800 but  not clear if he has high LDL; these were done soon after his diagnosis of severe diabetes and may or may not be persistent especially with his recent weight loss and markedly improved diet  HYPERTENSION: Long-standing and well controlled  ?  History of proteinuria: No records available but he apparently has been followed by nephrologist, recently normal renal function  History of renal cell carcinoma with no recurrence    PLAN:     Since he is having some nausea with metformin at breakfast he will reduce the morning dose to half tablet  He needs to start alternating fasting and postprandial blood sugars instead of before breakfast and supper  Discussed blood sugar targets 2 hours after eating  Discussed that he does not need to severely restrict carbohydrates and candy at small portions of whole grains  He does need to start exercising regularly and currently start walking 15-20 minutes daily during his lunch hour  Consultation with dietitian for meal planning  Follow-up in 2 months with repeat A1c  For his lipids recommend that we stop his Crestor which he only has started the last day or 2 and assess fasting lipids including LDL and decide on further treatment at that time   Patient Instructions  Check blood sugars on waking up  3/7  Also check blood sugars about 2 hours after a meal and do this after different meals by rotation  Recommended blood sugar levels on waking up is 90-130 and about 2 hours after meal is 130-160  Please bring your blood sugar monitor to each visit, thank you  Metformin 1/2 in am         Consultation note has been sent to the referring physician  Counseling time on subjects discussed in assessment and plan sections is over 50% of today's 60 minute visit   Elayne Snare 12/18/2017, 5:39 PM   Note: This office note was prepared with Dragon voice recognition system technology. Any transcriptional errors that result from this process are  unintentional.

## 2017-12-18 ENCOUNTER — Ambulatory Visit: Payer: BLUE CROSS/BLUE SHIELD | Admitting: Endocrinology

## 2017-12-18 ENCOUNTER — Encounter: Payer: Self-pay | Admitting: Endocrinology

## 2017-12-18 VITALS — BP 122/70 | HR 78 | Ht 72.5 in | Wt 254.4 lb

## 2017-12-18 DIAGNOSIS — E118 Type 2 diabetes mellitus with unspecified complications: Secondary | ICD-10-CM

## 2017-12-18 DIAGNOSIS — E782 Mixed hyperlipidemia: Secondary | ICD-10-CM | POA: Diagnosis not present

## 2017-12-18 DIAGNOSIS — I10 Essential (primary) hypertension: Secondary | ICD-10-CM | POA: Diagnosis not present

## 2017-12-18 NOTE — Patient Instructions (Addendum)
Check blood sugars on waking up  3/7  Also check blood sugars about 2 hours after a meal and do this after different meals by rotation  Recommended blood sugar levels on waking up is 90-130 and about 2 hours after meal is 130-160  Please bring your blood sugar monitor to each visit, thank you  Metformin 1/2 in am

## 2017-12-30 ENCOUNTER — Telehealth: Payer: Self-pay | Admitting: Endocrinology

## 2017-12-30 NOTE — Telephone Encounter (Signed)
Patient need refill of test strips and lancets for the countour meter  [id number] - [place], [place] - [address] DEA #:  [id number]

## 2017-12-31 MED ORDER — GLUCOSE BLOOD VI STRP: 1.0000 | ORAL_STRIP | Freq: Two times a day (BID) | 12 refills | Status: DC

## 2017-12-31 MED ORDER — LANCETS MISC: 1.0000 | Freq: Two times a day (BID) | 0 refills | Status: AC

## 2017-12-31 NOTE — Telephone Encounter (Signed)
Rxs sent. See meds.  

## 2018-01-03 ENCOUNTER — Other Ambulatory Visit: Payer: Self-pay

## 2018-01-03 ENCOUNTER — Telehealth: Payer: Self-pay | Admitting: Endocrinology

## 2018-01-03 MED ORDER — GLUCOSE BLOOD VI STRP: ORAL_STRIP | 3 refills | Status: DC

## 2018-01-03 NOTE — Telephone Encounter (Signed)
The correct test strips prescription for the Contour Next test strips have been [facility] in [place].

## 2018-01-03 NOTE — Telephone Encounter (Signed)
Pt needs RX for test strips for the Contour Next One Meter [facility] [place].  The previous RX for test strips that was sent this week was for the wrong Contour Meter

## 2018-01-06 ENCOUNTER — Telehealth: Payer: Self-pay | Admitting: Physician Assistant

## 2018-01-06 NOTE — Telephone Encounter (Signed)
Copied from [facility] [id number]. Topic: Quick Communication - See Telephone Encounter >> Jan 06, 2018 12:39 PM Conception Chancy, NT wrote: CRM for notification. See Telephone encounter for:  01/06/18.

## 2018-01-06 NOTE — Telephone Encounter (Signed)
error 

## 2018-01-16 ENCOUNTER — Encounter: Payer: BLUE CROSS/BLUE SHIELD | Attending: Endocrinology | Admitting: Dietician

## 2018-01-16 ENCOUNTER — Encounter: Payer: Self-pay | Admitting: Dietician

## 2018-01-16 DIAGNOSIS — E118 Type 2 diabetes mellitus with unspecified complications: Secondary | ICD-10-CM | POA: Diagnosis not present

## 2018-01-16 DIAGNOSIS — E782 Mixed hyperlipidemia: Secondary | ICD-10-CM | POA: Diagnosis not present

## 2018-01-16 DIAGNOSIS — Z713 Dietary counseling and surveillance: Secondary | ICD-10-CM | POA: Diagnosis not present

## 2018-01-16 DIAGNOSIS — E119 Type 2 diabetes mellitus without complications: Secondary | ICD-10-CM

## 2018-01-16 NOTE — Progress Notes (Signed)
Diabetes Self-Management Education  Visit Type: First/Initial  Appt. Start Time: 0930 Appt. End Time: 1100  01/16/2018  Gordon Green, identified by name and date of birth, is a 57 y.o. male with a diagnosis of Diabetes: Type 2. Other history includes mixed hyperlipidemia, history of renal cancer 2005 with 1 kidney removed, GERD, Gout, HTN. Labs 12/13/17 include:  GFR 69, BUN 27, Creatining 1.18, Cholesterol 209, HDL 23, Triglycerides 801 Medications include Metformin  Weight Hx: 255 lbs toay with loss of 30 lbs since January when he was newly diagnosed with diabetes.  He gave up stnacks except raw vegetables, started making his own [employer] bread, and decreased processed carbohydrates. 280's highest adult weight 190's lowest adult weight  Patient lives with his wife.  He does the shopping and cooking and his wife is very supportive related to healthier meal choices.   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 Patient is receptive to trying new foods.  ASSESSMENT  Height 6' 1.5" (1.867 m), weight 255 lb (115.7 kg). Body mass index is 33.19 kg/m.  Diabetes Self-Management Education - 01/16/18 0950      Visit Information   Visit Type  First/Initial      Initial Visit   Diabetes Type  Type 2    Are you currently following a meal plan?  Yes    What type of meal plan do you follow?  decreased processed carbohydrates    Are you taking your medications as prescribed?  Yes    Date Diagnosed  11/2017      Health Coping   How would you rate your overall health?  Good      Psychosocial Assessment   Patient Belief/Attitude about Diabetes  Motivated to manage diabetes    Self-care barriers  None    Self-management support  Doctor's office;Family    Other persons present  Patient    Patient Concerns  Nutrition/Meal planning;Glycemic  Control;Weight Control    Special Needs  None    Preferred Learning Style  No preference indicated    Learning Readiness  Ready    How often do you need to have someone help you when you read instructions, pamphlets, or other written materials from your doctor or pharmacy?  1 - Never    What is the last grade level you completed in school?  12th grade      Pre-Education Assessment   Patient understands the diabetes disease and treatment process.  Needs Instruction    Patient understands incorporating nutritional management into lifestyle.  Needs Instruction    Patient undertands incorporating physical activity into lifestyle.  Needs Instruction    Patient understands using medications safely.  Needs Instruction    Patient understands monitoring blood glucose, interpreting and using results  Needs Instruction    Patient understands prevention, detection, and treatment of acute complications.  Needs Instruction    Patient understands prevention, detection, and treatment of chronic complications.  Needs Instruction    Patient understands how to develop strategies to address psychosocial issues.  Needs Instruction    Patient understands how to develop strategies to promote health/change behavior.  Needs Instruction      Complications   Last HgB A1C per patient/outside source  9 % 12/13/17    How often do you check your blood sugar?  1-2 times/day    Fasting Blood  glucose range (mg/dL)  70-129    Postprandial Blood glucose range (mg/dL)  70-129    Number of hypoglycemic episodes per month  0    Number of hyperglycemic episodes per week  0    Have you had a dilated eye exam in the past 12 months?  No    Have you had a dental exam in the past 12 months?  Yes    Are you checking your feet?  No      Dietary Intake   Breakfast  scrambled eggs, broccoli, sausage     Snack (morning)  none    Lunch  salad (ham, [place], veges, olives, iceburg or romaine    Snack (afternoon)  none    Dinner  baked  fish, chicken, salad, vegetables    Snack (evening)  none    Beverage(s)  water, unsweetened tea with sweet and low, coffee with creamer and sweet and low.      Exercise   Exercise Type  Light (walking / raking leaves) walks several miles per day at work  (5,000 steps) plus more on the weekends    How many days per week to you exercise?  6    How many minutes per day do you exercise?  60    Total minutes per week of exercise  360      Patient Education   Previous Diabetes Education  No    Nutrition management   Role of diet in the treatment of diabetes and the relationship between the three main macronutrients and blood glucose level;Food label reading, portion sizes and measuring food.;Reviewed blood glucose goals for pre and post meals and how to evaluate the patients' food intake on their blood glucose level.;Meal options for control of blood glucose level and chronic complications.;Information on hints to eating out and maintain blood glucose control.;Carbohydrate counting;Other (comment) introduced plant based eating style    Physical activity and exercise   Role of exercise on diabetes management, blood pressure control and cardiac health.    Medications  Reviewed patients medication for diabetes, action, purpose, timing of dose and side effects.    Monitoring  Purpose and frequency of SMBG.;Identified appropriate SMBG and/or A1C goals.;Interpreting lab values - A1C, lipid, urine microalbumina.;Yearly dilated eye exam;Daily foot exams    Chronic complications  Relationship between chronic complications and blood glucose control;Dental care;Retinopathy and reason for yearly dilated eye exams    Psychosocial adjustment  Role of stress on diabetes;Identified and addressed patients feelings and concerns about diabetes      Individualized Goals (developed by patient)   Nutrition  General guidelines for healthy choices and portions discussed    Physical Activity  Exercise 5-7 days per week;60  minutes per day    Medications  take my medication as prescribed    Monitoring   test my blood glucose as discussed    Reducing Risk  examine blood glucose patterns;increase portions of nuts and seeds;do foot checks daily    Health Coping  discuss diabetes with (comment) MD, RD, CDE      Post-Education Assessment   Patient understands the diabetes disease and treatment process.  Demonstrates understanding / competency    Patient understands incorporating nutritional management into lifestyle.  Demonstrates understanding / competency    Patient undertands incorporating physical activity into lifestyle.  Demonstrates understanding / competency    Patient understands using medications safely.  Demonstrates understanding / competency    Patient understands monitoring blood glucose, interpreting and using results  Demonstrates  understanding / competency    Patient understands prevention, detection, and treatment of acute complications.  Demonstrates understanding / competency    Patient understands prevention, detection, and treatment of chronic complications.  Demonstrates understanding / competency    Patient understands how to develop strategies to address psychosocial issues.  Demonstrates understanding / competency    Patient understands how to develop strategies to promote health/change behavior.  Demonstrates understanding / competency      Outcomes   Expected Outcomes  Demonstrated interest in learning. Expect positive outcomes    Future DMSE  PRN    Program Status  Completed       Individualized Plan for Diabetes Self-Management Training:   Learning Objective:  Patient will have a greater understanding of diabetes self-management. Patient education plan is to attend individual and/or group sessions per assessed needs and concerns.   Plan:   Patient Instructions  Continue the great changes that you have made!  Water and unsweetened tea rather than those with  carbohydrate  Exercise  Whole foods, whole grains, more natural foods as grown.  Find ways to incorporate more plants. Consider decreasing your meat portion.  Other resources: MasteringDiabetes.org Pcrm.com Nutritionfacts.org [employer] over [employer]   Aim for 3-4 Carb Choices per meal (45-60 grams) +/- 1 either way  Aim for 0-15 Carbs per snack if hungry  Include protein in moderation with your meals and snacks Consider reading food labels for Total Carbohydrate and Fat Grams of foods Consider checking BG at alternate times per day as directed by MD  Consider taking medication as directed by MD      Expected Outcomes:  Demonstrated interest in learning. Expect positive outcomes  Education material provided: Living Well with Diabetes, Food label handouts, A1C conversion sheet, Meal plan card, My Plate and Snack sheet, Salad-mixing it up, breakfast  If problems or questions, patient to contact team via:  Phone  Future DSME appointment: PRN

## 2018-01-16 NOTE — Patient Instructions (Signed)
Continue the great changes that you have made!  Water and unsweetened tea rather than those with carbohydrate  Exercise  Whole foods, whole grains, more natural foods as grown.  Find ways to incorporate more plants. Consider decreasing your meat portion.  Other resources: MasteringDiabetes.org [employer]com Nutritionfacts.org [employer] over [employer]   Aim for 3-4 Carb Choices per meal (45-60 grams) +/- 1 either way  Aim for 0-15 Carbs per snack if hungry  Include protein in moderation with your meals and snacks Consider reading food labels for Total Carbohydrate and Fat Grams of foods Consider checking BG at alternate times per day as directed by MD  Consider taking medication as directed by MD

## 2018-02-05 ENCOUNTER — Other Ambulatory Visit: Payer: Self-pay | Admitting: Endocrinology

## 2018-02-05 DIAGNOSIS — E118 Type 2 diabetes mellitus with unspecified complications: Secondary | ICD-10-CM | POA: Diagnosis not present

## 2018-02-06 LAB — COMPREHENSIVE METABOLIC PANEL
ALBUMIN: 4.5 g/dL (ref 3.5–5.5)
ALK PHOS: 70 IU/L (ref 39–117)
ALT: 27 IU/L (ref 0–44)
AST: 19 IU/L (ref 0–40)
Albumin/Globulin Ratio: 1.7 (ref 1.2–2.2)
BILIRUBIN TOTAL: 0.5 mg/dL (ref 0.0–1.2)
BUN / CREAT RATIO: 14 (ref 9–20)
BUN: 18 mg/dL (ref 6–24)
CHLORIDE: 99 mmol/L (ref 96–106)
CO2: 26 mmol/L (ref 20–29)
CREATININE: 1.25 mg/dL (ref 0.76–1.27)
Calcium: 10.2 mg/dL (ref 8.7–10.2)
GFR calc non Af Amer: 64 mL/min/{1.73_m2} (ref >59)
GFR, EST AFRICAN AMERICAN: 74 mL/min/{1.73_m2} (ref >59)
GLOBULIN, TOTAL: 2.7 g/dL (ref 1.5–4.5)
GLUCOSE: 98 mg/dL (ref 65–99)
Potassium: 5 mmol/L (ref 3.5–5.2)
SODIUM: 141 mmol/L (ref 134–144)
TOTAL PROTEIN: 7.2 g/dL (ref 6.0–8.5)

## 2018-02-06 LAB — HEMOGLOBIN A1C
ESTIMATED AVERAGE GLUCOSE: 120 mg/dL
HEMOGLOBIN A1C: 5.8 % — AB (ref 4.8–5.6)

## 2018-02-06 LAB — LIPID PANEL
Chol/HDL Ratio: 7.2 ratio — ABNORMAL HIGH (ref 0.0–5.0)
Cholesterol, Total: 179 mg/dL (ref 100–199)
HDL: 25 mg/dL — ABNORMAL LOW
LDL Calculated: 90 mg/dL (ref 0–99)
Triglycerides: 318 mg/dL — ABNORMAL HIGH (ref 0–149)
VLDL Cholesterol Cal: 64 mg/dL — ABNORMAL HIGH (ref 5–40)

## 2018-02-12 ENCOUNTER — Ambulatory Visit: Payer: BLUE CROSS/BLUE SHIELD | Admitting: Endocrinology

## 2018-02-12 ENCOUNTER — Encounter: Payer: Self-pay | Admitting: Endocrinology

## 2018-02-12 VITALS — BP 116/76 | HR 90 | Wt 249.8 lb

## 2018-02-12 DIAGNOSIS — E118 Type 2 diabetes mellitus with unspecified complications: Secondary | ICD-10-CM | POA: Diagnosis not present

## 2018-02-12 MED ORDER — GLUCOSE BLOOD VI STRP: ORAL_STRIP | 3 refills | Status: DC

## 2018-02-12 NOTE — Progress Notes (Signed)
Patient ID: Gordon Green, male   DOB: 1961/09/25, 57 y.o.   MRN: [id number]          Reason for Appointment:  for Type 2 Diabetes  Referring physician: Venora Maples   History of Present Illness:          Date of diagnosis of type 2 diabetes mellitus: 11/2017         Recent history:         He had presented to his urgent care center with symptoms of excessive thirst, urination, fatigue and blurred vision starting in late December following a significant respiratory infection His blood sugar was markedly increased and he was referred for admission, however was treated in the emergency room for his blood sugar of 409 with IV fluids and an injection of insulin.  He did not have any renal function abnormalities or acidosis He was sent home on metformin 1 g twice a day    Non-insulin hypoglycemic drugs the patient is taking are: Metformin 500 mg breakfast and 1 g dinner   His baseline A1c at diagnosis was 11.6 and this is now 5.8  Current management, blood sugar patterns and problems identified:  On his initial consultation his blood sugars were relatively good but he was having difficulty with nausea after his morning metformin doses  He was told to take only half a tablet of metformin at breakfast and with this his nausea has resolved  His blood sugars have been improved compared to 2/19  He has been very regular with checking his blood sugars about twice a day on an average  Blood sugars are excellent without any fluctuation and overall averaging only 123  He is also checking readings after meals  He has seen the dietitian in March and has benefited from this  He has lost another 6 pounds since his last visit  He has tried to be a little more active although he thinks he can do better with more exercise          Side effects from medications have been: Nausea with 1 g metformin in the morning    Glucose monitoring:  done 2  times a day         Glucometer:  Contour .       Blood Glucose readings by time of day and averages from meter download:   Mean values apply above for all meters except median for One Touch  PRE-MEAL Fasting Lunch Dinner Bedtime Overall  Glucose range:      96-191  Mean/median:  114  121  126   123   POST-MEAL PC Breakfast PC Lunch PC Dinner  Glucose range:    103-191  Mean/median:    127    Self-care: The diet that the patient has been following is: tries to limit carbohydrates .      Typical meal intake: Breakfast is ham, egg               Dietician visit, most recent: None               Exercise:  Periodically doing some walking  Weight history:  Wt Readings from Last 3 Encounters:  02/12/18 249 lb 12.8 oz (113.3 kg)  01/16/18 255 lb (115.7 kg)  12/18/17 254 lb 6.4 oz (115.4 kg)    Glycemic control:   Lab Results  Component Value Date   HGBA1C 5.8 (H) 02/05/2018   HGBA1C 9.0 12/13/2017   HGBA1C 11.6 (H) 11/12/2017  Lab Results  Component Value Date   LDLCALC 90 02/05/2018   CREATININE 1.25 02/05/2018   No results found for: MICRALBCREAT  No results found for: FRUCTOSAMINE    Allergies as of 02/12/2018   No Known Allergies     Medication List        Accurate as of 02/12/18 10:30 AM. Always use your most recent med list.          acetaminophen 500 MG tablet Commonly known as:  TYLENOL Take 500 mg by mouth every 6 (six) hours as needed for mild pain, fever or headache.   allopurinol 300 MG tablet Commonly known as:  ZYLOPRIM Take 300 mg by mouth daily.   blood glucose meter kit and supplies Please check your blood pressure twice a day and keep a log.   COUGH/COLD MEDICINE PO Take 1 tablet by mouth every 6 (six) hours as needed (for symptoms).   glucose blood test strip Commonly known as:  CONTOUR NEXT TEST Use as instructed to test blood sugars 2 times daily. Dx Code   Lancets Misc 1 each by Does not apply route 2 (two) times daily. To be used with Contour glucose meter. Dx: E11.8     lisinopril-hydrochlorothiazide 20-12.5 MG tablet Commonly known as:  PRINZIDE,ZESTORETIC Take 1 tablet by mouth daily.   Magnesium 500 MG Tabs Take 500 mg by mouth daily.   metFORMIN 1000 MG tablet Commonly known as:  GLUCOPHAGE Take 1 tablet (1,000 mg total) by mouth 2 (two) times daily with a meal.   niacin 250 MG tablet Take 500 mg by mouth daily with breakfast.   omeprazole 20 MG tablet Commonly known as:  PRILOSEC OTC Take 20 mg by mouth daily.   Potassium Gluconate 595 MG Caps Take 595 mg by mouth daily.   rosuvastatin 40 MG tablet Commonly known as:  CRESTOR Take 0.5-1 tablets (20-40 mg total) by mouth daily. Start with one half tab for 1 week then increase to the full tab.  Take in the evenings.   Vitamin D (Ergocalciferol) 50000 units Caps capsule Commonly known as:  DRISDOL Take 50,000 units by mouth once a week on Saturdays   VITAMIN D-3 PO Take 1 capsule by mouth daily.       Allergies: No Known Allergies  Past Medical History:  Diagnosis Date  . Cancer ([place]) 02/2004   ;Hx: of right kidney cancer; kidney removed in 2005  . Diabetes mellitus without complication ([place])   . GERD (gastroesophageal reflux disease)   . Gout    Hx: of  . Hyperlipidemia   . Hypertension     Past Surgical History:  Procedure Laterality Date  . Fx: [place] fx:  Marland Kitchen INGUINAL HERNIA REPAIR     age 30  . KIDNEY SURGERY  02/2004   Hx; of removal of right kidnety in 2005  . ORIF TIBIA PLATEAU Left 08/25/2013   Procedure: OPEN REDUCTION INTERNAL FIXATION (ORIF) TIBIAL PLATEAU;  Surgeon: Renette Butters, MD;  Location: [facility];  Service: Orthopedics;  Laterality: Left;    Family History  Problem Relation Age of Onset  . Heart disease Father   . Hyperlipidemia Brother   . Colon cancer Neg Hx   . Esophageal cancer Neg Hx   . Prostate cancer Neg Hx   . Rectal cancer Neg Hx   . Stomach cancer Neg Hx   . Diabetes Neg Hx     Social History:  reports that he quit  smoking about 13 years ago. His smoking use included cigarettes. He has never used smokeless tobacco. He reports that he drinks about 2.4 oz of alcohol per week. He reports that he does not use drugs.   Review of Systems   Lipid history:   He is not on any medication, had baseline triglycerides over 800 LDL is now below 100 Fasting triglycerides are still relatively high at 318    Lab Results  Component Value Date   CHOL 179 02/05/2018   HDL 25 (L) 02/05/2018   LDLCALC 90 02/05/2018   TRIG 318 (H) 02/05/2018   CHOLHDL 7.2 (H) 02/05/2018           Hypertension: On treatment since 2005 and has been patient of a nephrologist in another town  BP Readings from Last 3 Encounters:  02/12/18 116/76  12/18/17 122/70  12/13/17 100/60    Most recent eye exam: None  Most recent foot exam: 1/19    LABS:  No visits with results within 1 Week(s) from this visit.  Latest known visit with results is:  Orders Only on 02/05/2018  Component Date Value Ref Range Status  . Glucose 02/05/2018 98  65 - 99 mg/dL Final  . BUN 02/05/2018 18  6 - 24 mg/dL Final  . Creatinine, Ser 02/05/2018 1.25  0.76 - 1.27 mg/dL Final  . GFR calc non Af Amer 02/05/2018 64  >59 mL/min/1.73 Final  . GFR calc Af Amer 02/05/2018 74  >59 mL/min/1.73 Final  . BUN/Creatinine Ratio 02/05/2018 14  9 - 20 Final  . Sodium 02/05/2018 141  134 - 144 mmol/L Final  . Potassium 02/05/2018 5.0  3.5 - 5.2 mmol/L Final  . Chloride 02/05/2018 99  96 - 106 mmol/L Final  . CO2 02/05/2018 26  20 - 29 mmol/L Final  . Calcium 02/05/2018 10.2  8.7 - 10.2 mg/dL Final  . Total Protein 02/05/2018 7.2  6.0 - 8.5 g/dL Final  . Albumin 02/05/2018 4.5  3.5 - 5.5 g/dL Final  . Globulin, Total 02/05/2018 2.7  1.5 - 4.5 g/dL Final  . Albumin/Globulin Ratio 02/05/2018 1.7  1.2 - 2.2 Final  . Bilirubin Total 02/05/2018 0.5  0.0 - 1.2 mg/dL Final  . Alkaline Phosphatase 02/05/2018 70  39 - 117 IU/L Final  . AST 02/05/2018 19  0 - 40 IU/L  Final  . ALT 02/05/2018 27  0 - 44 IU/L Final  . Cholesterol, Total 02/05/2018 179  100 - 199 mg/dL Final  . Triglycerides 02/05/2018 318* 0 - 149 mg/dL Final  . HDL 02/05/2018 25* >39 mg/dL Final  . VLDL Cholesterol Cal 02/05/2018 64* 5 - 40 mg/dL Final  . LDL Calculated 02/05/2018 90  0 - 99 mg/dL Final  . Chol/HDL Ratio 02/05/2018 7.2* 0.0 - 5.0 ratio Final   Comment:                                   T. Chol/HDL Ratio                                             Men  Women                               1/2 Avg.Risk  3.4    3.3                                   Avg.Risk  5.0    4.4                                2X Avg.Risk  9.6    7.1                                3X Avg.Risk 23.4   11.0   . Hgb A1c MFr Bld 02/05/2018 5.8* 4.8 - 5.6 % Final   Comment:          Prediabetes: 5.7 - 6.4          Diabetes: >6.4          Glycemic control for adults with diabetes: <7.0   . Est. average glucose Bld gHb Est-m* 02/05/2018 120  mg/dL Final    Physical Examination:  BP 116/76 (BP Location: Left Arm, Patient Position: Sitting, Cuff Size: Normal)   Pulse 90   Wt 249 lb 12.8 oz (113.3 kg)   SpO2 98%   BMI 32.51 kg/m           ASSESSMENT:  Diabetes type 2, on metformin  Diagnosed with relatively acute onset of symptomatic hyperglycemia  He has an A1c of 5.8 now  See history of present illness for detailed discussion of current diabetes management, blood sugar patterns and problems identified  With 1500 mg of metformin a day his blood sugars are nearly normal at home and A1c much improved He is doing well with changing his diet and continuing with weight loss Also starting some exercise Postprandial readings are also fairly good  HYPERTRIGLYCERIDEMIA: He has had significant improvement in his triglycerides with weight loss, improved blood sugar and diet    PLAN:   He can reduce testing to once a day No change in metformin, he is comfortable with continuing this twice a  day Recheck fasting triglycerides on the next visit and if consistently high may consider fenofibrate or Vascepa Regular exercise    There are no Patient Instructions on file for this visit.      Elayne Snare 02/12/2018, 10:30 AM   Note: This office note was prepared with Dragon voice recognition system technology. Any transcriptional errors that result from this process are unintentional.

## 2018-02-19 DIAGNOSIS — Z85528 Personal history of other malignant neoplasm of kidney: Secondary | ICD-10-CM | POA: Diagnosis not present

## 2018-02-27 DIAGNOSIS — Z85528 Personal history of other malignant neoplasm of kidney: Secondary | ICD-10-CM | POA: Diagnosis not present

## 2018-02-27 DIAGNOSIS — N281 Cyst of kidney, acquired: Secondary | ICD-10-CM | POA: Diagnosis not present

## 2018-02-27 DIAGNOSIS — Z905 Acquired absence of kidney: Secondary | ICD-10-CM | POA: Diagnosis not present

## 2018-02-27 DIAGNOSIS — N200 Calculus of kidney: Secondary | ICD-10-CM | POA: Diagnosis not present

## 2018-02-27 DIAGNOSIS — Z125 Encounter for screening for malignant neoplasm of prostate: Secondary | ICD-10-CM | POA: Diagnosis not present

## 2018-02-27 DIAGNOSIS — Q600 Renal agenesis, unilateral: Secondary | ICD-10-CM | POA: Diagnosis not present

## 2018-04-26 ENCOUNTER — Other Ambulatory Visit: Payer: Self-pay | Admitting: Endocrinology

## 2018-05-09 ENCOUNTER — Other Ambulatory Visit: Payer: Self-pay | Admitting: Endocrinology

## 2018-05-09 DIAGNOSIS — E782 Mixed hyperlipidemia: Secondary | ICD-10-CM | POA: Diagnosis not present

## 2018-05-09 DIAGNOSIS — E118 Type 2 diabetes mellitus with unspecified complications: Secondary | ICD-10-CM | POA: Diagnosis not present

## 2018-05-10 LAB — COMPREHENSIVE METABOLIC PANEL
ALBUMIN: 4.7 g/dL (ref 3.5–5.5)
ALT: 28 IU/L (ref 0–44)
AST: 19 IU/L (ref 0–40)
Albumin/Globulin Ratio: 1.6 (ref 1.2–2.2)
Alkaline Phosphatase: 79 IU/L (ref 39–117)
BILIRUBIN TOTAL: 0.4 mg/dL (ref 0.0–1.2)
BUN/Creatinine Ratio: 20 (ref 9–20)
BUN: 26 mg/dL — AB (ref 6–24)
CALCIUM: 10.1 mg/dL (ref 8.7–10.2)
CHLORIDE: 99 mmol/L (ref 96–106)
CO2: 24 mmol/L (ref 20–29)
CREATININE: 1.33 mg/dL — AB (ref 0.76–1.27)
GFR, EST AFRICAN AMERICAN: 69 mL/min/{1.73_m2} (ref >59)
GFR, EST NON AFRICAN AMERICAN: 59 mL/min/{1.73_m2} — AB (ref >59)
Globulin, Total: 2.9 g/dL (ref 1.5–4.5)
Glucose: 114 mg/dL — ABNORMAL HIGH (ref 65–99)
Potassium: 4.8 mmol/L (ref 3.5–5.2)
Sodium: 137 mmol/L (ref 134–144)
Total Protein: 7.6 g/dL (ref 6.0–8.5)

## 2018-05-10 LAB — LIPID PANEL
CHOL/HDL RATIO: 7.8 ratio — AB (ref 0.0–5.0)
Cholesterol, Total: 202 mg/dL — ABNORMAL HIGH (ref 100–199)
HDL: 26 mg/dL — AB (ref >39)
Triglycerides: 417 mg/dL — ABNORMAL HIGH (ref 0–149)

## 2018-05-10 LAB — HEMOGLOBIN A1C
Est. average glucose Bld gHb Est-mCnc: 111 mg/dL
Hgb A1c MFr Bld: 5.5 % (ref 4.8–5.6)

## 2018-05-13 NOTE — Progress Notes (Signed)
Patient ID: Gordon Green, male   DOB: 1961/04/16, 57 y.o.   MRN: [id number]          Reason for Appointment: Follow-up for Type 2 Diabetes    History of Present Illness:          Date of diagnosis of type 2 diabetes mellitus: 11/2017         Recent history:         He had presented to his urgent care center with symptoms of excessive thirst, urination, fatigue and blurred vision starting in late December following a significant respiratory infection His blood sugar was markedly increased and he was referred for admission, however was treated in the emergency room for his blood sugar of 409 with IV fluids and an injection of insulin.  He did not have any renal function abnormalities or acidosis He was sent home on metformin 1 g twice a day   Non-insulin hypoglycemic drugs the patient is taking are: Metformin 500 mg breakfast and 1 g dinner  His baseline A1c at diagnosis was 11.6 and this is now 5.5  Current management, blood sugar patterns and problems identified:  His blood sugars continue to be excellent at home  He is checking his fasting readings only occasionally and lowest reading is 95  Readings after meals are also checked sporadically, highest recently 169  He is still trying to cut back on carbohydrates although not always watching fat intake  No side effects with taking half a tablet of metformin in the morning and 1 tablet in the evening  He has been variably active although not always doing formal exercise  Has lost 3 pounds        Side effects from medications have been: Nausea with 1 g metformin in the morning    Glucose monitoring:  done 2  times a day         Glucometer:  Contour .      Blood Glucose readings by time of day and averages from meter download:  MORNING blood sugar average 126, evening average 118 Overall blood sugar range 85-169 and average 118  Self-care: The diet that the patient has been following is: tries to limit carbohydrates  .      Typical meal intake: Breakfast is mostly toast, occasionally sausage or bacon             Dietician visit, most recent: 01/2018               Exercise:  Periodically doing some walking  Weight history:  Wt Readings from Last 3 Encounters:  05/14/18 246 lb (111.6 kg)  02/12/18 249 lb 12.8 oz (113.3 kg)  01/16/18 255 lb (115.7 kg)    Glycemic control:   Lab Results  Component Value Date   HGBA1C 5.5 05/09/2018   HGBA1C 5.8 (H) 02/05/2018   HGBA1C 9.0 12/13/2017   Lab Results  Component Value Date   LDLCALC Comment 05/09/2018   CREATININE 1.33 (H) 05/09/2018   No results found for: MICRALBCREAT  No results found for: FRUCTOSAMINE    Allergies as of 05/14/2018   No Known Allergies     Medication List        Accurate as of 05/14/18  1:45 PM. Always use your most recent med list.          acetaminophen 500 MG tablet Commonly known as:  TYLENOL Take 500 mg by mouth every 6 (six) hours as needed for mild pain, fever or headache.  allopurinol 300 MG tablet Commonly known as:  ZYLOPRIM Take 300 mg by mouth daily.   blood glucose meter kit and supplies Please check your blood pressure twice a day and keep a log.   fenofibrate 145 MG tablet Commonly known as:  TRICOR Take 1 tablet (145 mg total) by mouth daily.   glucose blood test strip Commonly known as:  CONTOUR NEXT TEST Use as instructed to test blood sugars 1 time daily   CONTOUR NEXT TEST test strip Generic drug:  glucose blood USE AS INSTRUCTED TO TEST BLOOD SUGARS 2 TIMES DAILY. DX CODE   Lancets Misc 1 each by Does not apply route 2 (two) times daily. To be used with Contour glucose meter. Dx: E11.8   lisinopril-hydrochlorothiazide 20-12.5 MG tablet Commonly known as:  PRINZIDE,ZESTORETIC Take 1 tablet by mouth daily.   Magnesium 500 MG Tabs Take 500 mg by mouth daily.   metFORMIN 1000 MG tablet Commonly known as:  GLUCOPHAGE Take 1 tablet (1,000 mg total) by mouth 2 (two) times  daily with a meal.   niacin 250 MG tablet Take 500 mg by mouth daily with breakfast.   omeprazole 20 MG tablet Commonly known as:  PRILOSEC OTC Take 20 mg by mouth daily.   Potassium Gluconate 595 MG Caps Take 595 mg by mouth daily.   Vitamin D (Ergocalciferol) 50000 units Caps capsule Commonly known as:  DRISDOL Take 50,000 units by mouth once a week on Saturdays   VITAMIN D-3 PO Take 1 capsule by mouth daily.       Allergies: No Known Allergies  Past Medical History:  Diagnosis Date  . Cancer ([place]) 02/2004   ;Hx: of right kidney cancer; kidney removed in 2005  . Diabetes mellitus without complication ([place])   . GERD (gastroesophageal reflux disease)   . Gout    Hx: of  . Hyperlipidemia   . Hypertension     Past Surgical History:  Procedure Laterality Date  . Fx: [place] fx:  Marland Kitchen INGUINAL HERNIA REPAIR     age 20  . KIDNEY SURGERY  02/2004   Hx; of removal of right kidnety in 2005  . ORIF TIBIA PLATEAU Left 08/25/2013   Procedure: OPEN REDUCTION INTERNAL FIXATION (ORIF) TIBIAL PLATEAU;  Surgeon: Renette Butters, MD;  Location: [facility];  Service: Orthopedics;  Laterality: Left;    Family History  Problem Relation Age of Onset  . Heart disease Father   . Hyperlipidemia Brother   . Colon cancer Neg Hx   . Esophageal cancer Neg Hx   . Prostate cancer Neg Hx   . Rectal cancer Neg Hx   . Stomach cancer Neg Hx   . Diabetes Neg Hx     Social History:  reports that he quit smoking about 14 years ago. His smoking use included cigarettes. He has never used smokeless tobacco. He reports that he drinks about 2.4 oz of alcohol per week. He reports that he does not use drugs.   Review of Systems   Lipid history:   He is not on any medication, had baseline triglycerides over 800 LDL is consistently below 100 Fasting triglycerides are still relatively high at 417, previously 318, the test was done after coffee and cream    Lab Results  Component Value  Date   CHOL 202 (H) 05/09/2018   HDL 26 (L) 05/09/2018   LDLCALC Comment 05/09/2018   TRIG 417 (H) 05/09/2018   CHOLHDL 7.8 (H) 05/09/2018  Hypertension: On treatment since 2005  Also has proteinuria and history of nephrectomy, not recently seen by nephrologist  BP Readings from Last 3 Encounters:  05/14/18 112/70  02/12/18 116/76  12/18/17 122/70   Has borderline renal function  Lab Results  Component Value Date   CREATININE 1.33 (H) 05/09/2018   CREATININE 1.25 02/05/2018   CREATININE 1.18 12/13/2017    Most recent eye exam: None  Most recent foot exam: 1/19    LABS:  Orders Only on 05/09/2018  Component Date Value Ref Range Status  . Glucose 05/09/2018 114* 65 - 99 mg/dL Final  . BUN 05/09/2018 26* 6 - 24 mg/dL Final  . Creatinine, Ser 05/09/2018 1.33* 0.76 - 1.27 mg/dL Final  . GFR calc non Af Amer 05/09/2018 59* >59 mL/min/1.73 Final  . GFR calc Af Amer 05/09/2018 69  >59 mL/min/1.73 Final  . BUN/Creatinine Ratio 05/09/2018 20  9 - 20 Final  . Sodium 05/09/2018 137  134 - 144 mmol/L Final  . Potassium 05/09/2018 4.8  3.5 - 5.2 mmol/L Final  . Chloride 05/09/2018 99  96 - 106 mmol/L Final  . CO2 05/09/2018 24  20 - 29 mmol/L Final  . Calcium 05/09/2018 10.1  8.7 - 10.2 mg/dL Final  . Total Protein 05/09/2018 7.6  6.0 - 8.5 g/dL Final  . Albumin 05/09/2018 4.7  3.5 - 5.5 g/dL Final  . Globulin, Total 05/09/2018 2.9  1.5 - 4.5 g/dL Final  . Albumin/Globulin Ratio 05/09/2018 1.6  1.2 - 2.2 Final  . Bilirubin Total 05/09/2018 0.4  0.0 - 1.2 mg/dL Final  . Alkaline Phosphatase 05/09/2018 79  39 - 117 IU/L Final  . AST 05/09/2018 19  0 - 40 IU/L Final  . ALT 05/09/2018 28  0 - 44 IU/L Final  Orders Only on 05/09/2018  Component Date Value Ref Range Status  . Hgb A1c MFr Bld 05/09/2018 5.5  4.8 - 5.6 % Final   Comment:          Prediabetes: 5.7 - 6.4          Diabetes: >6.4          Glycemic control for adults with diabetes: <7.0   . Est. average  glucose Bld gHb Est-m* 05/09/2018 111  mg/dL Final  Orders Only on 05/09/2018  Component Date Value Ref Range Status  . Cholesterol, Total 05/09/2018 202* 100 - 199 mg/dL Final  . Triglycerides 05/09/2018 417* 0 - 149 mg/dL Final  . HDL 05/09/2018 26* >39 mg/dL Final  . VLDL Cholesterol Cal 05/09/2018 Comment  5 - 40 mg/dL Final   Comment: The calculation for the VLDL cholesterol is not valid when triglyceride level is >400 mg/dL.   Marland Kitchen LDL Calculated 05/09/2018 Comment  0 - 99 mg/dL Final   Comment: Triglyceride result indicated is too high for an accurate LDL cholesterol estimation.   . Chol/HDL Ratio 05/09/2018 7.8* 0.0 - 5.0 ratio Final   Comment:                                   T. Chol/HDL Ratio                                             Men  Women  1/2 Avg.Risk  3.4    3.3                                   Avg.Risk  5.0    4.4                                2X Avg.Risk  9.6    7.1                                3X Avg.Risk 23.4   11.0     Physical Examination:  BP 112/70 (BP Location: Left Arm, Patient Position: Sitting, Cuff Size: Normal)   Pulse 70   Ht 6' 1.5" (1.867 m)   Wt 246 lb (111.6 kg)   SpO2 97%   BMI 32.02 kg/m           ASSESSMENT:  Diabetes type 2 with obesity, on metformin  See history of present illness for detailed discussion of current diabetes management, blood sugar patterns and problems identified  His A1c is excellent at 5.5  He has kept his weight down and is currently taking 1500 mg of metformin a day Although his blood sugars are mostly fairly good at home he has readings occasionally in the 160 range after eating and 127 fasting indicating continued need for pharmacological therapy Blood sugars tend to be lower midday and afternoon  HYPERTRIGLYCERIDEMIA: He has had persistent higher triglyceride level despite better blood sugars, some weight loss and usually fairly good diet He can do a little better with  more consistently living off high fat meats  HYPERTENSION and mild CKD: His creatinine is slightly higher but since his blood pressure is not excessively low and he has proteinuria he can continue the same dose of lisinopril for now    PLAN:   He can reduce METFORMIN to once a day in the evening only with 1 g dose He will try to be consistent with diet and exercise regimen  Discussed blood sugar targets  FENOFIBRATE 145 mg be started for his hypertriglyceridemia To have fasting labs on the next visit    There are no Patient Instructions on file for this visit.      Elayne Snare 05/14/2018, 1:45 PM   Note: This office note was prepared with Dragon voice recognition system technology. Any transcriptional errors that result from this process are unintentional.

## 2018-05-14 ENCOUNTER — Encounter: Payer: Self-pay | Admitting: Endocrinology

## 2018-05-14 ENCOUNTER — Ambulatory Visit: Payer: BLUE CROSS/BLUE SHIELD | Admitting: Endocrinology

## 2018-05-14 VITALS — BP 112/70 | HR 70 | Ht 73.5 in | Wt 246.0 lb

## 2018-05-14 DIAGNOSIS — E118 Type 2 diabetes mellitus with unspecified complications: Secondary | ICD-10-CM | POA: Diagnosis not present

## 2018-05-14 DIAGNOSIS — E782 Mixed hyperlipidemia: Secondary | ICD-10-CM | POA: Diagnosis not present

## 2018-05-14 MED ORDER — FENOFIBRATE 145 MG PO TABS: 145.0000 mg | ORAL_TABLET | Freq: Every day | ORAL | 3 refills | Status: DC

## 2018-06-07 ENCOUNTER — Other Ambulatory Visit: Payer: Self-pay | Admitting: Physician Assistant

## 2018-06-09 NOTE — Telephone Encounter (Signed)
No Uric Acid in last 12 months  Allopurinol refill Last Refill: ? t OV:? PCP: [place]: [facility]

## 2018-06-20 ENCOUNTER — Other Ambulatory Visit: Payer: Self-pay | Admitting: Physician Assistant

## 2018-06-20 NOTE — Telephone Encounter (Signed)
allopurinol refill Last Refill:not on file Last OV: 2/8/19r PCP: Philis Fendt PA  Pharmacy:[facility] [phone]

## 2018-06-20 NOTE — Telephone Encounter (Signed)
Copied from [place] [id number]. Topic: Quick Communication - Rx Refill/Question >> Jun 20, 2018  8:32 AM Bea Graff, NT wrote: Medication: allopurinol (ZYLOPRIM) 300 MG tablet   Has the patient contacted their pharmacy? Yes.   (Agent: If no, request that the patient contact the pharmacy for the refill.) (Agent: If yes, when and what did the pharmacy advise?)  Preferred Pharmacy (with phone number or street name): [id number] [place], [place] [phone] (Phone) [phone] (Fax)    Agent: Please be advised that RX refills may take up to 3 business days. We ask that you follow-up with your pharmacy.

## 2018-06-23 MED ORDER — ALLOPURINOL 300 MG PO TABS: 300.0000 mg | ORAL_TABLET | Freq: Every day | ORAL | 1 refills | Status: DC

## 2018-08-01 ENCOUNTER — Other Ambulatory Visit: Payer: Self-pay | Admitting: Endocrinology

## 2018-08-01 DIAGNOSIS — E782 Mixed hyperlipidemia: Secondary | ICD-10-CM | POA: Diagnosis not present

## 2018-08-01 DIAGNOSIS — E118 Type 2 diabetes mellitus with unspecified complications: Secondary | ICD-10-CM | POA: Diagnosis not present

## 2018-08-02 LAB — COMPREHENSIVE METABOLIC PANEL
ALBUMIN: 4.9 g/dL (ref 3.5–5.5)
ALK PHOS: 57 IU/L (ref 39–117)
ALT: 73 IU/L — AB (ref 0–44)
AST: 45 IU/L — AB (ref 0–40)
Albumin/Globulin Ratio: 1.8 (ref 1.2–2.2)
BILIRUBIN TOTAL: 0.4 mg/dL (ref 0.0–1.2)
BUN/Creatinine Ratio: 18 (ref 9–20)
BUN: 26 mg/dL — ABNORMAL HIGH (ref 6–24)
CHLORIDE: 99 mmol/L (ref 96–106)
CO2: 25 mmol/L (ref 20–29)
Calcium: 10.1 mg/dL (ref 8.7–10.2)
Creatinine, Ser: 1.45 mg/dL — ABNORMAL HIGH (ref 0.76–1.27)
GFR calc Af Amer: 62 mL/min/{1.73_m2} (ref >59)
GFR calc non Af Amer: 53 mL/min/{1.73_m2} — ABNORMAL LOW (ref >59)
GLUCOSE: 82 mg/dL (ref 65–99)
Globulin, Total: 2.7 g/dL (ref 1.5–4.5)
Potassium: 4.3 mmol/L (ref 3.5–5.2)
Sodium: 142 mmol/L (ref 134–144)
Total Protein: 7.6 g/dL (ref 6.0–8.5)

## 2018-08-02 LAB — LIPID PANEL
CHOLESTEROL TOTAL: 188 mg/dL (ref 100–199)
Chol/HDL Ratio: 6.7 ratio — ABNORMAL HIGH (ref 0.0–5.0)
HDL: 28 mg/dL — AB (ref >39)
LDL Calculated: 104 mg/dL — ABNORMAL HIGH (ref 0–99)
TRIGLYCERIDES: 281 mg/dL — AB (ref 0–149)
VLDL Cholesterol Cal: 56 mg/dL — ABNORMAL HIGH (ref 5–40)

## 2018-08-02 LAB — MICROALBUMIN / CREATININE URINE RATIO
Creatinine, Urine: 89 mg/dL
MICROALB/CREAT RATIO: 130.1 mg/g{creat} — AB (ref 0.0–30.0)
Microalbumin, Urine: 115.8 ug/mL

## 2018-08-02 LAB — HEMOGLOBIN A1C
Est. average glucose Bld gHb Est-mCnc: 114 mg/dL
Hgb A1c MFr Bld: 5.6 % (ref 4.8–5.6)

## 2018-08-08 ENCOUNTER — Ambulatory Visit: Payer: BLUE CROSS/BLUE SHIELD | Admitting: Endocrinology

## 2018-08-08 ENCOUNTER — Telehealth: Payer: Self-pay

## 2018-08-08 ENCOUNTER — Other Ambulatory Visit: Payer: Self-pay | Admitting: Endocrinology

## 2018-08-08 ENCOUNTER — Encounter: Payer: Self-pay | Admitting: Endocrinology

## 2018-08-08 VITALS — BP 142/86 | HR 90 | Ht 73.0 in | Wt 247.0 lb

## 2018-08-08 DIAGNOSIS — E782 Mixed hyperlipidemia: Secondary | ICD-10-CM

## 2018-08-08 DIAGNOSIS — R945 Abnormal results of liver function studies: Secondary | ICD-10-CM | POA: Diagnosis not present

## 2018-08-08 DIAGNOSIS — E118 Type 2 diabetes mellitus with unspecified complications: Secondary | ICD-10-CM

## 2018-08-08 DIAGNOSIS — N289 Disorder of kidney and ureter, unspecified: Secondary | ICD-10-CM

## 2018-08-08 DIAGNOSIS — Z8739 Personal history of other diseases of the musculoskeletal system and connective tissue: Secondary | ICD-10-CM

## 2018-08-08 DIAGNOSIS — E559 Vitamin D deficiency, unspecified: Secondary | ICD-10-CM

## 2018-08-08 MED ORDER — ALLOPURINOL 300 MG PO TABS: 300.0000 mg | ORAL_TABLET | Freq: Every day | ORAL | 3 refills | Status: DC

## 2018-08-08 NOTE — Telephone Encounter (Signed)
LVM for pt to make lab appointment

## 2018-08-08 NOTE — Patient Instructions (Signed)
Stop Niacin and fenofibrate

## 2018-08-08 NOTE — Progress Notes (Signed)
Patient ID: Gordon Green, male   DOB: [birth year], 57 y.o.   MRN: [id number]          Reason for Appointment: Follow-up for Type 2 Diabetes    History of Present Illness:          Date of diagnosis of type 2 diabetes mellitus: 11/2017         Recent history:         He had presented to his urgent care center with symptoms of excessive thirst, urination, fatigue and blurred vision starting in late December following a significant respiratory infection His blood sugar was markedly increased and he was referred for admission, however was treated in the emergency room for his blood sugar of 409 with IV fluids and an injection of insulin.  He did not have any renal function abnormalities or acidosis He was sent home on metformin 1 g twice a day   Non-insulin hypoglycemic drugs the patient is taking are: Metformin none at breakfast and 1 g dinner  His baseline A1c at diagnosis was 11.6 This has been quite normal now 5.6    Current management, blood sugar patterns and problems identified:  His blood sugars are usually fairly good at home but he did not bring his monitor  Even with stopping the metformin in the morning his blood sugars overall are about the same  He thinks that blood sugars are usually under 140 after meals if he checks them  His weight has leveled off now  He thinks he is trying to walk and be active        Side effects from medications have been: Nausea with 1 g metformin in the morning    Glucose monitoring: ?  Frequency of monitoring       Glucometer:  Contour .      Blood Glucose readings  MORNING blood sugar by recall: 110-120  PC <140  Self-care: The diet that the patient has been following is: tries to limit carbohydrates .      Typical meal intake: Breakfast is mostly toast, occasionally sausage or bacon             Dietician visit, most recent: 01/2018               Exercise:  Periodically doing walking or other activities, no formal  exercise  Weight history:  Wt Readings from Last 3 Encounters:  08/08/18 247 lb (112 kg)  05/14/18 246 lb (111.6 kg)  02/12/18 249 lb 12.8 oz (113.3 kg)    Glycemic control:   Lab Results  Component Value Date   HGBA1C 5.6 08/01/2018   HGBA1C 5.5 05/09/2018   HGBA1C 5.8 (H) 02/05/2018   Lab Results  Component Value Date   LDLCALC 104 (H) 08/01/2018   CREATININE 1.45 (H) 08/01/2018   No results found for: MICRALBCREAT  No results found for: FRUCTOSAMINE    Allergies as of 08/08/2018   No Known Allergies     Medication List        Accurate as of 08/08/18 10:11 AM. Always use your most recent med list.          acetaminophen 500 MG tablet Commonly known as:  TYLENOL Take 500 mg by mouth every 6 (six) hours as needed for mild pain, fever or headache.   allopurinol 300 MG tablet Commonly known as:  ZYLOPRIM Take 1 tablet (300 mg total) by mouth daily.   blood glucose meter kit and supplies Please check your  blood pressure twice a day and keep a log.   fenofibrate 145 MG tablet Commonly known as:  TRICOR Take 1 tablet (145 mg total) by mouth daily.   glucose blood test strip Use as instructed to test blood sugars 1 time daily   CONTOUR NEXT TEST test strip Generic drug:  glucose blood USE AS INSTRUCTED TO TEST BLOOD SUGARS 2 TIMES DAILY. DX CODE   Lancets Misc 1 each by Does not apply route 2 (two) times daily. To be used with Contour glucose meter. Dx: E11.8   lisinopril-hydrochlorothiazide 20-12.5 MG tablet Commonly known as:  PRINZIDE,ZESTORETIC Take 1 tablet by mouth daily.   Magnesium 500 MG Tabs Take 500 mg by mouth daily.   metFORMIN 1000 MG tablet Commonly known as:  GLUCOPHAGE Take 1 tablet (1,000 mg total) by mouth 2 (two) times daily with a meal.   niacin 250 MG tablet Take 500 mg by mouth daily with breakfast.   omeprazole 20 MG tablet Commonly known as:  PRILOSEC OTC Take 20 mg by mouth daily.   Potassium Gluconate 595 MG  Caps Take 595 mg by mouth daily.   Vitamin D (Ergocalciferol) 50000 units Caps capsule Commonly known as:  DRISDOL Take 50,000 units by mouth once a week on Saturdays   VITAMIN D-3 PO Take 1 capsule by mouth daily.       Allergies: No Known Allergies  Past Medical History:  Diagnosis Date  . Cancer ([place]) 02/2004   ;Hx: of right kidney cancer; kidney removed in 2005  . Diabetes mellitus without complication ([place])   . GERD (gastroesophageal reflux disease)   . Gout    Hx: of  . Hyperlipidemia   . Hypertension     Past Surgical History:  Procedure Laterality Date  . Fx: [place] fx:  Marland Kitchen INGUINAL HERNIA REPAIR     age 29  . KIDNEY SURGERY  02/2004   Hx; of removal of right kidnety in 2005  . ORIF TIBIA PLATEAU Left 08/25/2013   Procedure: OPEN REDUCTION INTERNAL FIXATION (ORIF) TIBIAL PLATEAU;  Surgeon: Renette Butters, MD;  Location: [facility];  Service: Orthopedics;  Laterality: Left;    Family History  Problem Relation Age of Onset  . Heart disease Father   . Hyperlipidemia Brother   . Colon cancer Neg Hx   . Esophageal cancer Neg Hx   . Prostate cancer Neg Hx   . Rectal cancer Neg Hx   . Stomach cancer Neg Hx   . Diabetes Neg Hx     Social History:  reports that he quit smoking about 14 years ago. His smoking use included cigarettes. He has never used smokeless tobacco. He reports that he drinks about 4.0 standard drinks of alcohol per week. He reports that he does not use drugs.   Review of Systems   Lipid history:   He was started on fenofibrate, had baseline triglycerides over 800 LDL is consistently below 100 Fasting triglycerides are improved although he had labs done late afternoon without a lunch Previously without medications they were still relatively high at 417  Also LDL appears to be relatively higher He says he takes niacin OTC which is a [facility] brand    Lab Results  Component Value Date   CHOL 188 08/01/2018   HDL 28 (L)  08/01/2018   LDLCALC 104 (H) 08/01/2018   TRIG 281 (H) 08/01/2018   CHOLHDL 6.7 (H) 08/01/2018  Hypertension: On treatment since 2005 Taking lisinopril HCTZ prescribed by nephrologist Also has proteinuria and history of nephrectomy, not followed by nephrologist for some time He is not monitoring blood pressure at home usually  BP Readings from Last 3 Encounters:  08/08/18 (!) 142/86  05/14/18 112/70  02/12/18 116/76   Has borderline renal function but this appears to be gradually getting worse now  Lab Results  Component Value Date   CREATININE 1.45 (H) 08/01/2018   CREATININE 1.33 (H) 05/09/2018   CREATININE 1.25 02/05/2018    Most recent eye exam:?  Most recent foot exam: 1/19  He is asking about renewing his prescription for allopurinol previously prescribed by nephrologist for couple of episodes of gout, has not taken it for a month or so   LABS:  No visits with results within 1 Week(s) from this visit.  Latest known visit with results is:  Orders Only on 08/01/2018  Component Date Value Ref Range Status  . Glucose 08/01/2018 82  65 - 99 mg/dL Final  . BUN 08/01/2018 26* 6 - 24 mg/dL Final  . Creatinine, Ser 08/01/2018 1.45* 0.76 - 1.27 mg/dL Final  . GFR calc non Af Amer 08/01/2018 53* >59 mL/min/1.73 Final  . GFR calc Af Amer 08/01/2018 62  >59 mL/min/1.73 Final  . BUN/Creatinine Ratio 08/01/2018 18  9 - 20 Final  . Sodium 08/01/2018 142  134 - 144 mmol/L Final  . Potassium 08/01/2018 4.3  3.5 - 5.2 mmol/L Final  . Chloride 08/01/2018 99  96 - 106 mmol/L Final  . CO2 08/01/2018 25  20 - 29 mmol/L Final  . Calcium 08/01/2018 10.1  8.7 - 10.2 mg/dL Final  . Total Protein 08/01/2018 7.6  6.0 - 8.5 g/dL Final  . Albumin 08/01/2018 4.9  3.5 - 5.5 g/dL Final  . Globulin, Total 08/01/2018 2.7  1.5 - 4.5 g/dL Final  . Albumin/Globulin Ratio 08/01/2018 1.8  1.2 - 2.2 Final  . Bilirubin Total 08/01/2018 0.4  0.0 - 1.2 mg/dL Final  . Alkaline Phosphatase  08/01/2018 57  39 - 117 IU/L Final  . AST 08/01/2018 45* 0 - 40 IU/L Final  . ALT 08/01/2018 73* 0 - 44 IU/L Final  . Cholesterol, Total 08/01/2018 188  100 - 199 mg/dL Final  . Triglycerides 08/01/2018 281* 0 - 149 mg/dL Final  . HDL 08/01/2018 28* >39 mg/dL Final  . VLDL Cholesterol Cal 08/01/2018 56* 5 - 40 mg/dL Final  . LDL Calculated 08/01/2018 104* 0 - 99 mg/dL Final  . Chol/HDL Ratio 08/01/2018 6.7* 0.0 - 5.0 ratio Final   Comment:                                   T. Chol/HDL Ratio                                             Men  Women                               1/2 Avg.Risk  3.4    3.3  Avg.Risk  5.0    4.4                                2X Avg.Risk  9.6    7.1                                3X Avg.Risk 23.4   11.0   . Creatinine, Urine 08/01/2018 89.0  Not Estab. mg/dL Final  . Microalbumin, Urine 08/01/2018 115.8  Not Estab. ug/mL Final  . Microalb/Creat Ratio 08/01/2018 130.1* 0.0 - 30.0 mg/g creat Final   Comment:                      Normal:                0.0 -  30.0                      Albuminuria:          31.0 - 300.0                      Clinical albuminuria:       >300.0   . Hgb A1c MFr Bld 08/01/2018 5.6  4.8 - 5.6 % Final   Comment:          Prediabetes: 5.7 - 6.4          Diabetes: >6.4          Glycemic control for adults with diabetes: <7.0   . Est. average glucose Bld gHb Est-m* 08/01/2018 114  mg/dL Final    Physical Examination:  BP (!) 142/86   Pulse 90   Ht 6' 1"  (1.854 m)   Wt 247 lb (112 kg)   SpO2 97%   BMI 32.59 kg/m       No ankle edema present    ASSESSMENT:  Diabetes type 2 with obesity, on metformin  See history of present illness for detailed discussion of current diabetes management, blood sugar patterns and problems identified  His A1c is excellent at 5.6 and stable  Although he has not done formal exercise he is usually watching his diet and his weight is stable Reportedly no high  readings after meals at home also  For now he will continue his regimen of 1000 mg metformin at dinnertime for benefits of insulin resistance and long-term benefits   HYPERTRIGLYCERIDEMIA: He has had persistent hypertriglyceridemia Although his labs were done in the late afternoon with only breakfast that day he still has a somewhat high triglycerides Previously without fenofibrate the levels were over 400 Diet is fairly good  However currently LDL is marginally higher also Since his renal function appears to be a little worse not clear if this has any relationship to fenofibrate  HYPERTENSION and mild CKD: His creatinine is slightly higher Recommend that he be evaluated by a nephrologist, he does not want to go out of town for his previous nephrologist and will refer him to a local person  ABNORMAL liver functions: Etiology unclear and this is new Usually not a result of fenofibrate but he is also taking OTC niacin Do not think he has had excessive alcohol intake also at least before the labs were done   PLAN:   Continue lifestyle changes to help with further weight loss More regular exercise Alternate  fasting and postprandial readings No change in metformin  He will need to establish with a PCP for regular exams and evaluation of multiple medical problems as above For liver function abnormality he will hold off on his niacin and fenofibrate and follow-up with PCP Discussed limiting alcohol intake at a given time  Blood pressure medication will be regulated by nephrologist Also he can be tested for vitamin D deficiency since he apparently has been taking this long-term and he will need to discuss this with his PCP  Allopurinol can be resumed because of his previous history of gout although no uric acid levels are available, new prescription sent  Total visit time for evaluation and management of multiple problems and counseling =25 minutes  There are no Patient Instructions on  file for this visit.      Elayne Snare 08/08/2018, 10:11 AM   Note: This office note was prepared with Dragon voice recognition system technology. Any transcriptional errors that result from this process are unintentional.

## 2018-08-08 NOTE — Telephone Encounter (Signed)
Patient requesting you refill the 50,000 vit D since Dr. Arlana Hove is no longer going to be filling this and the patient is out-he has been taking this weekly for a couple of years- should we fill please advise- [facility] to [facility] appropriate to refill

## 2018-08-08 NOTE — Telephone Encounter (Signed)
Since there is no vitamin D level available on the record would like to have this checked first before using such a high dose medication long-term

## 2018-08-15 ENCOUNTER — Ambulatory Visit: Payer: BLUE CROSS/BLUE SHIELD | Admitting: Endocrinology

## 2018-09-01 ENCOUNTER — Telehealth: Payer: Self-pay | Admitting: Endocrinology

## 2018-09-01 NOTE — Telephone Encounter (Signed)
Notified pt Rx lisinopril--was not prescribe by Dr. Dwyane Dee....and by nephrologist. Pt stated stop seeing nephrologist from out of town. I told pt to call his PCP for possible refill since Dr. Dwyane Dee is not here for this week.

## 2018-09-01 NOTE — Telephone Encounter (Signed)
°*  STAT* If patient is at the pharmacy, call can be transferred to refill team.   1. Which medications need to be refilled? (please list name of each medication and dose if known) Lisinopril 20-12.5mg   2. Which pharmacy/location (including street and city if local pharmacy) [facility]   3. Do they need a 30 day or 90 day supply? 90 day

## 2018-09-04 ENCOUNTER — Encounter: Payer: Self-pay | Admitting: Family Medicine

## 2018-09-04 ENCOUNTER — Ambulatory Visit (INDEPENDENT_AMBULATORY_CARE_PROVIDER_SITE_OTHER): Payer: BLUE CROSS/BLUE SHIELD | Admitting: Family Medicine

## 2018-09-04 ENCOUNTER — Other Ambulatory Visit: Payer: Self-pay

## 2018-09-04 ENCOUNTER — Telehealth: Payer: Self-pay

## 2018-09-04 VITALS — BP 137/82 | HR 79 | Temp 98.4°F | Ht 72.05 in | Wt 250.0 lb

## 2018-09-04 DIAGNOSIS — E119 Type 2 diabetes mellitus without complications: Secondary | ICD-10-CM

## 2018-09-04 MED ORDER — LISINOPRIL-HYDROCHLOROTHIAZIDE 20-12.5 MG PO TABS: 1.0000 | ORAL_TABLET | Freq: Every day | ORAL | 0 refills | Status: DC

## 2018-09-04 NOTE — Patient Instructions (Signed)
° ° ° °  If you have lab work done today you will be contacted with your lab results within the next 2 weeks.  If you have not heard from us then please contact us. The fastest way to get your results is to register for My Chart. ° ° °IF you received an x-ray today, you will receive an invoice from [facility]. Please contact  Radiology at [phone] with questions or concerns regarding your invoice.  ° °IF you received labwork today, you will receive an invoice from [facility]. Please contact [facility] at [phone] with questions or concerns regarding your invoice.  ° °Our billing staff will not be able to assist you with questions regarding bills from these companies. ° °You will be contacted with the lab results as soon as they are available. The fastest way to get your results is to activate your My Chart account. Instructions are located on the last page of this paperwork. If you have not heard from us regarding the results in 2 weeks, please contact this office. °  ° ° ° °

## 2018-09-04 NOTE — Progress Notes (Signed)
Left w/o being seen

## 2018-09-25 ENCOUNTER — Other Ambulatory Visit: Payer: Self-pay | Admitting: Family Medicine

## 2018-09-25 MED ORDER — LISINOPRIL-HYDROCHLOROTHIAZIDE 20-12.5 MG PO TABS: 1.0000 | ORAL_TABLET | Freq: Every day | ORAL | 0 refills | Status: DC

## 2018-09-25 NOTE — Telephone Encounter (Signed)
Copied from [facility] #[id number]. Topic: Quick Communication - See Telephone Encounter >> Sep 25, 2018  8:45 AM Ivar Drape wrote: CRM for notification. See Telephone encounter for: 09/25/18. Patient has an appt w/the provider on 10/08/18 but he will run out of his lisinopril-hydrochlorothiazide (PRINZIDE,ZESTORETIC) 20-12.5 MG tablet medication at the end of November.  He wants a refill to take him up until his appt and have it [facility] [place] [facility] [address].

## 2018-10-08 ENCOUNTER — Other Ambulatory Visit: Payer: Self-pay

## 2018-10-08 ENCOUNTER — Encounter: Payer: Self-pay | Admitting: Family Medicine

## 2018-10-08 ENCOUNTER — Ambulatory Visit: Payer: BLUE CROSS/BLUE SHIELD | Admitting: Family Medicine

## 2018-10-08 VITALS — BP 133/81 | HR 75 | Temp 98.8°F | Ht 72.05 in | Wt 250.2 lb

## 2018-10-08 DIAGNOSIS — E118 Type 2 diabetes mellitus with unspecified complications: Secondary | ICD-10-CM

## 2018-10-08 DIAGNOSIS — E559 Vitamin D deficiency, unspecified: Secondary | ICD-10-CM | POA: Diagnosis not present

## 2018-10-08 DIAGNOSIS — R7989 Other specified abnormal findings of blood chemistry: Secondary | ICD-10-CM

## 2018-10-08 DIAGNOSIS — R945 Abnormal results of liver function studies: Secondary | ICD-10-CM

## 2018-10-08 DIAGNOSIS — Z8739 Personal history of other diseases of the musculoskeletal system and connective tissue: Secondary | ICD-10-CM | POA: Diagnosis not present

## 2018-10-08 DIAGNOSIS — N289 Disorder of kidney and ureter, unspecified: Secondary | ICD-10-CM

## 2018-10-08 NOTE — Progress Notes (Signed)
12/4/201911:38 AM  Gordon Green February 26, 1961, 57 y.o. male [id number]  Chief Complaint  Patient presents with  . Results    here to talk about results from Dr. Dwyane Dee, suggesting to change or modify medication due to kidney functions    HPI:   Patient is a 57 y.o. male with past medical history significant for DM2, HLP, gout,  Vit D deficiency, renal insufficiency, h/o RCC who presents today for labs results  Saw Dr Dwyane Dee early oct 2019 Abnormal LFTs - hold niacin and fenofibrate Vitamin D and uric acid need to be tested Dr Dwyane Dee referred him back to nephrologist, has an appt next week H/o RCC, right side removed, 2005, Dr Ysidro Evert in [place], [place] His mother just passed away from CVA  His current metformin dose is 1011m once a day  Drinks maybe a drink once a month Has h/o fatty liver - per ultfrasound  Previous PCP CCarlis Abbott PA-C  Declines flu vaccine  Thinks that last td vaccine was not more than 10 years ago  Lab Results  Component Value Date   HGBA1C 5.6 08/01/2018   HGBA1C 5.5 05/09/2018   HGBA1C 5.8 (H) 02/05/2018   Lab Results  Component Value Date   LDLCALC 104 (H) 08/01/2018   CREATININE 1.45 (H) 08/01/2018  crt has been increasing for past 9 months, 1.18 on feb 2019  Fall Risk  10/08/2018 09/04/2018 01/16/2018 12/13/2017 10/30/2017  Falls in the past year? 0 No No No No     [facility] 10/08/2018 09/04/2018 01/16/2018  Decreased Interest 0 0 0  Down, Depressed, Hopeless 0 0 0  PHQ - 2 Score 0 0 0    No Known Allergies  Prior to Admission medications   Medication Sig Start Date End Date Taking? Authorizing Provider  acetaminophen (TYLENOL) 500 MG tablet Take 500 mg by mouth every 6 (six) hours as needed for mild pain, fever or headache.    [provider]  allopurinol (ZYLOPRIM) 300 MG tablet Take 1 tablet (300 mg total) by mouth daily. 08/08/18   KElayne Snare MD  blood glucose meter kit and supplies Please check your blood  pressure twice a day and keep a log. 11/13/17   CJola Schmidt MD  Cholecalciferol (VITAMIN D-3 PO) Take 1 capsule by mouth daily.     [provider]  CONTOUR NEXT TEST test strip USE AS INSTRUCTED TO TEST BLOOD SUGARS 2 TIMES DAILY. DX CODE 04/28/18   KElayne Snare MD  fenofibrate (TRICOR) 145 MG tablet TAKE 1 TABLET BY MOUTH EVERY DAY Patient not taking: Reported on 09/04/2018 08/08/18   KElayne Snare MD  glucose blood (CONTOUR NEXT TEST) test strip Use as instructed to test blood sugars 1 time daily 02/12/18   KElayne Snare MD  Lancets MISC 1 each by Does not apply route 2 (two) times daily. To be used with Contour glucose meter. Dx: E11.8 12/31/17   KElayne Snare MD  lisinopril-hydrochlorothiazide (PRINZIDE,ZESTORETIC) 20-12.5 MG tablet Take 1 tablet by mouth daily. 09/25/18   SRutherford Guys MD  Magnesium 500 MG TABS Take 500 mg by mouth daily.    [provider]  metFORMIN (GLUCOPHAGE) 1000 MG tablet Take 1 tablet (1,000 mg total) by mouth 2 (two) times daily with a meal. Patient taking differently: Take 1,000 mg by mouth 2 (two) times daily with a meal.  12/13/17   CTereasa Coop PA-C  niacin 250 MG tablet Take 500 mg by mouth daily with breakfast.  [provider]  omeprazole (PRILOSEC OTC) 20 MG tablet Take 20 mg by mouth daily.    [provider]  Potassium Gluconate 595 MG CAPS Take 595 mg by mouth daily.    [provider]  Vitamin D, Ergocalciferol, (DRISDOL) 50000 units CAPS capsule Take 50,000 units by mouth once a week on Saturdays 07/18/16   [provider]    Past Medical History:  Diagnosis Date  . Cancer ([place]) 02/2004   ;Hx: of right kidney cancer; kidney removed in 2005  . Diabetes mellitus without complication ([place])   . GERD (gastroesophageal reflux disease)   . Gout    Hx: of  . Hyperlipidemia   . Hypertension     Past Surgical History:  Procedure Laterality Date  . Fx: [place] fx:  Marland Kitchen INGUINAL HERNIA  REPAIR     age 34  . KIDNEY SURGERY  02/2004   Hx; of removal of right kidnety in 2005  . ORIF TIBIA PLATEAU Left 08/25/2013   Procedure: OPEN REDUCTION INTERNAL FIXATION (ORIF) TIBIAL PLATEAU;  Surgeon: Renette Butters, MD;  Location: [facility];  Service: Orthopedics;  Laterality: Left;    Social History   Tobacco Use  . Smoking status: Former Smoker    Types: Cigarettes    Last attempt to quit: 03/24/2004    Years since quitting: 14.5  . Smokeless tobacco: Never Used  . Tobacco comment: quit smoking cigarettes in 2005  Substance Use Topics  . Alcohol use: Yes    Alcohol/week: 4.0 standard drinks    Types: 2 Cans of beer, 2 Standard drinks or equivalent per week    Family History  Problem Relation Age of Onset  . Heart disease Father   . Hyperlipidemia Brother   . Colon cancer Neg Hx   . Esophageal cancer Neg Hx   . Prostate cancer Neg Hx   . Rectal cancer Neg Hx   . Stomach cancer Neg Hx   . Diabetes Neg Hx     Review of Systems  Constitutional: Negative for chills and fever.  Respiratory: Negative for cough and shortness of breath.   Cardiovascular: Negative for chest pain, palpitations and leg swelling.  Gastrointestinal: Negative for abdominal pain, nausea and vomiting.   Per hpi  OBJECTIVE:  Blood pressure 133/81, pulse 75, temperature 98.8 F (37.1 C), temperature source Oral, height 6' 0.05" (1.83 m), weight 250 lb 3.2 oz (113.5 kg), SpO2 95 %. Body mass index is 33.89 kg/m.   Physical Exam  Constitutional: He is oriented to person, place, and time. He appears well-developed and well-nourished.  HENT:  Head: Normocephalic and atraumatic.  Mouth/Throat: Oropharynx is clear and moist.  Eyes: Pupils are equal, round, and reactive to light. Conjunctivae and EOM are normal.  Neck: Neck supple.  Cardiovascular: Normal rate and regular rhythm. Exam reveals no gallop and no friction rub.  No murmur heard. Pulmonary/Chest: Effort normal and breath sounds normal.  He has no wheezes. He has no rales.  Musculoskeletal: He exhibits no edema.  Neurological: He is alert and oriented to person, place, and time.  Skin: Skin is warm and dry.  Psychiatric: He has a normal mood and affect.  Nursing note and vitals reviewed.   ASSESSMENT and PLAN  1. History of gout Checking labs today, medications will be adjusted as needed.  - Uric Acid  2. Vitamin D deficiency Checking labs today, medications will be adjusted as needed.  - Vitamin D, 25-hydroxy  3.  Abnormal liver function test Rechecking labs. H/o fatty liver. Discussed weight loss - Comprehensive metabolic panel - HCV Ab w/Rflx to Verification  4. Controlled type 2 diabetes mellitus with complication, without long-term current use of insulin ([place]) Managed by endo - Ambulatory referral to Ophthalmology  5. Renal insufficiency Recent low increase. H/o RCC. Has upcoming appt with renal  Return in about 2 months (around 12/09/2018) for routine.    Rutherford Guys, MD [facility] at [facility] [place], [place] [address] Ph.  [phone] Fax [phone]

## 2018-10-08 NOTE — Patient Instructions (Signed)
° ° ° °  If you have lab work done today you will be contacted with your lab results within the next 2 weeks.  If you have not heard from us then please contact us. The fastest way to get your results is to register for My Chart. ° ° °IF you received an x-ray today, you will receive an invoice from [facility]. Please contact  Radiology at [phone] with questions or concerns regarding your invoice.  ° °IF you received labwork today, you will receive an invoice from [facility]. Please contact [facility] at [phone] with questions or concerns regarding your invoice.  ° °Our billing staff will not be able to assist you with questions regarding bills from these companies. ° °You will be contacted with the lab results as soon as they are available. The fastest way to get your results is to activate your My Chart account. Instructions are located on the last page of this paperwork. If you have not heard from us regarding the results in 2 weeks, please contact this office. °  ° ° ° °

## 2018-10-09 LAB — COMPREHENSIVE METABOLIC PANEL
ALT: 27 IU/L (ref 0–44)
AST: 20 IU/L (ref 0–40)
Albumin/Globulin Ratio: 1.5 (ref 1.2–2.2)
Albumin: 4.7 g/dL (ref 3.5–5.5)
Alkaline Phosphatase: 76 IU/L (ref 39–117)
BUN/Creatinine Ratio: 20 (ref 9–20)
BUN: 24 mg/dL (ref 6–24)
Bilirubin Total: 0.4 mg/dL (ref 0.0–1.2)
CO2: 20 mmol/L (ref 20–29)
Calcium: 10 mg/dL (ref 8.7–10.2)
Chloride: 98 mmol/L (ref 96–106)
Creatinine, Ser: 1.23 mg/dL (ref 0.76–1.27)
GFR calc Af Amer: 75 mL/min/{1.73_m2} (ref >59)
GFR calc non Af Amer: 65 mL/min/{1.73_m2} (ref >59)
Globulin, Total: 3.1 g/dL (ref 1.5–4.5)
Glucose: 91 mg/dL (ref 65–99)
Potassium: 4.7 mmol/L (ref 3.5–5.2)
Sodium: 138 mmol/L (ref 134–144)
Total Protein: 7.8 g/dL (ref 6.0–8.5)

## 2018-10-09 LAB — VITAMIN D 25 HYDROXY (VIT D DEFICIENCY, FRACTURES): Vit D, 25-Hydroxy: 24.6 ng/mL — ABNORMAL LOW (ref 30.0–100.0)

## 2018-10-09 LAB — HCV AB W/RFLX TO VERIFICATION: HCV Ab: <0.1 s/co ratio (ref 0.0–0.9)

## 2018-10-09 LAB — HCV INTERPRETATION

## 2018-10-09 LAB — URIC ACID: Uric Acid: 6.5 mg/dL (ref 3.7–8.6)

## 2018-10-21 ENCOUNTER — Encounter: Payer: Self-pay | Admitting: *Deleted

## 2018-10-25 ENCOUNTER — Other Ambulatory Visit: Payer: Self-pay | Admitting: Family Medicine

## 2018-10-27 NOTE — Telephone Encounter (Signed)
Requested Prescriptions  Pending Prescriptions Disp Refills  . lisinopril-hydrochlorothiazide (PRINZIDE,ZESTORETIC) 20-12.5 MG tablet [Pharmacy Med Name: LISINOPRIL-HCTZ 20-12.5 MG TAB] 90 tablet 0    Sig: TAKE 1 TABLET BY MOUTH EVERY DAY     Cardiovascular:  ACEI + Diuretic Combos Passed - 10/25/2018  9:34 AM      Passed - Na in normal range and within 180 days    Sodium  Date Value Ref Range Status  10/08/2018 138 134 - 144 mmol/L Final         Passed - K in normal range and within 180 days    Potassium  Date Value Ref Range Status  10/08/2018 4.7 3.5 - 5.2 mmol/L Final         Passed - Cr in normal range and within 180 days    Creat  Date Value Ref Range Status  04/27/2015 1.47 (H) 0.50 - 1.35 mg/dL Final   Creatinine, Ser  Date Value Ref Range Status  10/08/2018 1.23 0.76 - 1.27 mg/dL Final         Passed - Ca in normal range and within 180 days    Calcium  Date Value Ref Range Status  10/08/2018 10.0 8.7 - 10.2 mg/dL Final         Passed - Patient is not pregnant      Passed - Last BP in normal range    BP Readings from Last 1 Encounters:  10/08/18 133/81         Passed - Valid encounter within last 6 months    Recent Outpatient Visits          2 weeks ago History of gout   [facility], Lilia Argue, MD   1 month ago Type 2 diabetes mellitus without complication, without long-term current use of insulin [facility])   [facility], Irma M, MD   10 months ago Type 2 diabetes mellitus without complication, without long-term current use of insulin [facility])   [facility], Audrie Lia, PA-C   12 months ago Sore throat   [facility], Audrie Lia, PA-C   1 year ago Influenza   Primary Care at [facility], Reather Laurence, PA-C      Future Appointments            In 1 month Pamella Pert, Lilia Argue, MD Primary Care at [place], [facility]

## 2018-11-06 ENCOUNTER — Other Ambulatory Visit: Payer: Self-pay | Admitting: Endocrinology

## 2018-11-06 DIAGNOSIS — E782 Mixed hyperlipidemia: Secondary | ICD-10-CM | POA: Diagnosis not present

## 2018-11-06 DIAGNOSIS — E118 Type 2 diabetes mellitus with unspecified complications: Secondary | ICD-10-CM | POA: Diagnosis not present

## 2018-11-07 ENCOUNTER — Telehealth: Payer: Self-pay | Admitting: Endocrinology

## 2018-11-07 LAB — COMPREHENSIVE METABOLIC PANEL
ALT: 28 IU/L (ref 0–44)
AST: 19 IU/L (ref 0–40)
Albumin/Globulin Ratio: 1.7 (ref 1.2–2.2)
Albumin: 4.3 g/dL (ref 3.5–5.5)
Alkaline Phosphatase: 81 IU/L (ref 39–117)
BUN/Creatinine Ratio: 19 (ref 9–20)
BUN: 21 mg/dL (ref 6–24)
Bilirubin Total: 0.3 mg/dL (ref 0.0–1.2)
CO2: 24 mmol/L (ref 20–29)
CREATININE: 1.09 mg/dL (ref 0.76–1.27)
Calcium: 9.2 mg/dL (ref 8.7–10.2)
Chloride: 96 mmol/L (ref 96–106)
GFR calc Af Amer: 87 mL/min/{1.73_m2} (ref >59)
GFR calc non Af Amer: 75 mL/min/{1.73_m2} (ref >59)
Globulin, Total: 2.6 g/dL (ref 1.5–4.5)
Glucose: 99 mg/dL (ref 65–99)
Potassium: 4.3 mmol/L (ref 3.5–5.2)
Sodium: 136 mmol/L (ref 134–144)
Total Protein: 6.9 g/dL (ref 6.0–8.5)

## 2018-11-07 LAB — LIPID PANEL
Chol/HDL Ratio: 7.8 ratio — ABNORMAL HIGH (ref 0.0–5.0)
Cholesterol, Total: 187 mg/dL (ref 100–199)
HDL: 24 mg/dL — AB (ref >39)
Triglycerides: 577 mg/dL (ref 0–149)

## 2018-11-07 LAB — HEMOGLOBIN A1C
Est. average glucose Bld gHb Est-mCnc: 103 mg/dL
Hgb A1c MFr Bld: 5.2 % (ref 4.8–5.6)

## 2018-11-07 LAB — MICROALBUMIN / CREATININE URINE RATIO
Creatinine, Urine: 129.7 mg/dL
Microalb/Creat Ratio: 90.4 mg/g creat — ABNORMAL HIGH (ref 0.0–30.0)
Microalbumin, Urine: 117.2 ug/mL

## 2018-11-07 NOTE — Telephone Encounter (Signed)
Per Dr Dwyane Dee patient is coming in at 10:15 AM on Tuesday 11/11/2018. However I need 30 minutes visit for the 10 AM patient that day. Please move him to another slot. I called patient left voicemail to call us back and reschedule his appointment

## 2018-11-10 ENCOUNTER — Other Ambulatory Visit: Payer: Self-pay | Admitting: Endocrinology

## 2018-11-10 ENCOUNTER — Encounter: Payer: Self-pay | Admitting: Endocrinology

## 2018-11-10 ENCOUNTER — Ambulatory Visit: Payer: BLUE CROSS/BLUE SHIELD | Admitting: Endocrinology

## 2018-11-10 VITALS — BP 134/76 | HR 80 | Ht 72.5 in | Wt 254.4 lb

## 2018-11-10 DIAGNOSIS — N289 Disorder of kidney and ureter, unspecified: Secondary | ICD-10-CM | POA: Diagnosis not present

## 2018-11-10 DIAGNOSIS — E782 Mixed hyperlipidemia: Secondary | ICD-10-CM | POA: Diagnosis not present

## 2018-11-10 DIAGNOSIS — E118 Type 2 diabetes mellitus with unspecified complications: Secondary | ICD-10-CM | POA: Diagnosis not present

## 2018-11-10 MED ORDER — ICOSAPENT ETHYL 1 G PO CAPS: 2.0000 g | ORAL_CAPSULE | Freq: Two times a day (BID) | ORAL | 1 refills | Status: DC

## 2018-11-10 NOTE — Progress Notes (Signed)
Patient ID: Gordon Green, male   DOB: 1961-04-17, 58 y.o.   MRN: [id number]          Reason for Appointment: Follow-up for Type 2 Diabetes    History of Present Illness:          Date of diagnosis of type 2 diabetes mellitus: 11/2017        Background history:       He had presented to his urgent care center with symptoms of excessive thirst, urination, fatigue and blurred vision starting in late December following a significant respiratory infection His blood sugar was markedly increased and he was referred for admission, however was treated in the emergency room for his blood sugar of 409 with IV fluids and an injection of insulin.  He did not have any renal function abnormalities or acidosis He was sent home on metformin 1 g twice a day  His baseline A1c at diagnosis was 11.6   Recent history:  Non-insulin hypoglycemic drugs the patient is taking are: Metformin none at breakfast and 1 g dinner   A1c has been consistently normal, now 5.2 compared to previous reading of 5.6    Current management, blood sugar patterns and problems identified:  His blood sugars are usually fairly good at home but he says they seem to be higher soon after he wakes up lab glucose was 99 fasting  Again he is asking about reducing his metformin but he is concerned about the effects his renal function  Although he is trying to do some exercise periodically he is not to do it consistently  Has gained about 4 pounds recently with inconsistent diet  Blood sugars are usually not high after meals  No side effects with current dose of metformin        Side effects from medications have been: Nausea with 1 g metformin in the morning    Glucose monitoring: ?  Frequency of monitoring       Glucometer:  Contour .      Blood Glucose readings  MORNING blood sugar readings: 101-127  PC recently 103-126  Self-care: The diet that the patient has been following is: tries to limit carbohydrates .        Typical meal intake: Breakfast is mostly toast, occasionally sausage or bacon             Dietician visit, most recent: 01/2018               Exercise:  He is doing walking or other activities, not consistent, no formal exercise  Weight history:  Wt Readings from Last 3 Encounters:  11/10/18 254 lb 6.4 oz (115.4 kg)  10/08/18 250 lb 3.2 oz (113.5 kg)  09/04/18 250 lb (113.4 kg)    Glycemic control:   Lab Results  Component Value Date   HGBA1C 5.2 11/06/2018   HGBA1C 5.6 08/01/2018   HGBA1C 5.5 05/09/2018   Lab Results  Component Value Date   LDLCALC Comment 11/06/2018   CREATININE 1.09 11/06/2018   No results found for: MICRALBCREAT  No results found for: FRUCTOSAMINE    Allergies as of 11/10/2018   No Known Allergies     Medication List       Accurate as of November 10, 2018 10:22 AM. Always use your most recent med list.        acetaminophen 500 MG tablet Commonly known as:  TYLENOL Take 500 mg by mouth every 6 (six) hours as needed for mild pain,  fever or headache.   allopurinol 300 MG tablet Commonly known as:  ZYLOPRIM TAKE 1 TABLET BY MOUTH EVERY DAY   blood glucose meter kit and supplies Please check your blood pressure twice a day and keep a log.   glucose blood test strip Commonly known as:  CONTOUR NEXT TEST Use as instructed to test blood sugars 1 time daily   CONTOUR NEXT TEST test strip Generic drug:  glucose blood USE AS INSTRUCTED TO TEST BLOOD SUGARS 2 TIMES DAILY. DX CODE   Lancets Misc 1 each by Does not apply route 2 (two) times daily. To be used with Contour glucose meter. Dx: E11.8   lisinopril-hydrochlorothiazide 20-12.5 MG tablet Commonly known as:  PRINZIDE,ZESTORETIC TAKE 1 TABLET BY MOUTH EVERY DAY   Magnesium 500 MG Tabs Take 500 mg by mouth daily.   metFORMIN 1000 MG tablet Commonly known as:  GLUCOPHAGE Take 1 tablet (1,000 mg total) by mouth 2 (two) times daily with a meal.   omeprazole 20 MG tablet Commonly  known as:  PRILOSEC OTC Take 20 mg by mouth daily.   Potassium Gluconate 595 MG Caps Take 595 mg by mouth daily.       Allergies: No Known Allergies  Past Medical History:  Diagnosis Date  . Cancer ([place]) 02/2004   ;Hx: of right kidney cancer; kidney removed in 2005  . Diabetes mellitus without complication ([place])   . GERD (gastroesophageal reflux disease)   . Gout    Hx: of  . Hyperlipidemia   . Hypertension     Past Surgical History:  Procedure Laterality Date  . Fx: [place] fx:  Marland Kitchen INGUINAL HERNIA REPAIR     age 62  . KIDNEY SURGERY  02/2004   Hx; of removal of right kidnety in 2005  . ORIF TIBIA PLATEAU Left 08/25/2013   Procedure: OPEN REDUCTION INTERNAL FIXATION (ORIF) TIBIAL PLATEAU;  Surgeon: Renette Butters, MD;  Location: [place];  Service: Orthopedics;  Laterality: Left;    Family History  Problem Relation Age of Onset  . Heart disease Father   . Hyperlipidemia Brother   . Colon cancer Neg Hx   . Esophageal cancer Neg Hx   . Prostate cancer Neg Hx   . Rectal cancer Neg Hx   . Stomach cancer Neg Hx   . Diabetes Neg Hx     Social History:  reports that he quit smoking about 14 years ago. His smoking use included cigarettes. He has never used smokeless tobacco. He reports current alcohol use of about 4.0 standard drinks of alcohol per week. He reports that he does not use drugs.   Review of Systems   Lipid history:   He had been on fenofibrate, had baseline triglycerides over 800 LDL has been consistently below 100  Fasting triglycerides are much higher now He has not been taking his niacin or fenofibrate on the last visit because of issues with liver and kidney functions However his triglycerides have usually been over 250 consistently Recently not consistent with diet also   Lab Results  Component Value Date   CHOL 187 11/06/2018   CHOL 188 08/01/2018   CHOL 202 (H) 05/09/2018   Lab Results  Component Value Date   HDL 24 (L)  11/06/2018   HDL 28 (L) 08/01/2018   HDL 26 (L) 05/09/2018   Lab Results  Component Value Date   LDLCALC Comment 11/06/2018   LDLCALC 104 (H) 08/01/2018   [place] Comment 05/09/2018  Lab Results  Component Value Date   TRIG 577 (HH) 11/06/2018   TRIG 281 (H) 08/01/2018   TRIG 417 (H) 05/09/2018   Lab Results  Component Value Date   CHOLHDL 7.8 (H) 11/06/2018   CHOLHDL 6.7 (H) 08/01/2018   CHOLHDL 7.8 (H) 05/09/2018   No results found for: LDLDIRECT          Hypertension: On treatment since 2005 Taking lisinopril HCTZ prescribed by nephrologist Also has proteinuria and history of nephrectomy, is waiting for his nephrology appointment  Blood pressure appears better  BP Readings from Last 3 Encounters:  11/10/18 134/76  10/08/18 133/81  09/04/18 137/82   Has borderline renal function but this is improved and fenofibrate was stopped on the last visit He also is trying to cut back on meats  Lab Results  Component Value Date   CREATININE 1.09 11/06/2018   CREATININE 1.23 10/08/2018   CREATININE 1.45 (H) 08/01/2018    Most recent eye exam:?  Most recent foot exam: 12/2017  His previous nephrologist had asked him to stay on allopurinol and this has been continued   LABS:  Orders Only on 11/06/2018  Component Date Value Ref Range Status  . Glucose 11/06/2018 99  65 - 99 mg/dL Final  . BUN 11/06/2018 21  6 - 24 mg/dL Final  . Creatinine, Ser 11/06/2018 1.09  0.76 - 1.27 mg/dL Final  . GFR calc non Af Amer 11/06/2018 75  >59 mL/min/1.73 Final  . GFR calc Af Amer 11/06/2018 87  >59 mL/min/1.73 Final  . BUN/Creatinine Ratio 11/06/2018 19  9 - 20 Final  . Sodium 11/06/2018 136  134 - 144 mmol/L Final  . Potassium 11/06/2018 4.3  3.5 - 5.2 mmol/L Final  . Chloride 11/06/2018 96  96 - 106 mmol/L Final  . CO2 11/06/2018 24  20 - 29 mmol/L Final  . Calcium 11/06/2018 9.2  8.7 - 10.2 mg/dL Final  . Total Protein 11/06/2018 6.9  6.0 - 8.5 g/dL Final  . Albumin  11/06/2018 4.3  3.5 - 5.5 g/dL Final  . Globulin, Total 11/06/2018 2.6  1.5 - 4.5 g/dL Final  . Albumin/Globulin Ratio 11/06/2018 1.7  1.2 - 2.2 Final  . Bilirubin Total 11/06/2018 0.3  0.0 - 1.2 mg/dL Final  . Alkaline Phosphatase 11/06/2018 81  39 - 117 IU/L Final  . AST 11/06/2018 19  0 - 40 IU/L Final  . ALT 11/06/2018 28  0 - 44 IU/L Final  . Cholesterol, Total 11/06/2018 187  100 - 199 mg/dL Final  . Triglycerides 11/06/2018 577* 0 - 149 mg/dL Final  . HDL 11/06/2018 24* >39 mg/dL Final  . VLDL Cholesterol Cal 11/06/2018 Comment  5 - 40 mg/dL Final   Comment: The calculation for the VLDL cholesterol is not valid when triglyceride level is >400 mg/dL.   Marland Kitchen LDL Calculated 11/06/2018 Comment  0 - 99 mg/dL Final   Comment: Triglyceride result indicated is too high for an accurate LDL cholesterol estimation.   . Chol/HDL Ratio 11/06/2018 7.8* 0.0 - 5.0 ratio Final   Comment:                                   T. Chol/HDL Ratio  Men  Women                               1/2 Avg.Risk  3.4    3.3                                   Avg.Risk  5.0    4.4                                2X Avg.Risk  9.6    7.1                                3X Avg.Risk 23.4   11.0   . Creatinine, Urine 11/06/2018 129.7  Not Estab. mg/dL Final  . Microalbumin, Urine 11/06/2018 117.2  Not Estab. ug/mL Final  . Microalb/Creat Ratio 11/06/2018 90.4* 0.0 - 30.0 mg/g creat Final   Comment:                      Normal:                0.0 -  30.0                      Albuminuria:          31.0 - 300.0                      Clinical albuminuria:       >300.0   . Hgb A1c MFr Bld 11/06/2018 5.2  4.8 - 5.6 % Final   Comment:          Prediabetes: 5.7 - 6.4          Diabetes: >6.4          Glycemic control for adults with diabetes: <7.0   . Est. average glucose Bld gHb Est-m* 11/06/2018 103  mg/dL Final    Physical Examination:  BP 134/76 (BP Location: Left Arm, Patient  Position: Sitting, Cuff Size: Normal)   Pulse 80   Ht 6' 0.5" (1.842 m)   Wt 254 lb 6.4 oz (115.4 kg)   SpO2 97%   BMI 34.03 kg/m   Exam not indicated    ASSESSMENT:  Diabetes type 2 with obesity, on metformin  See history of present illness for detailed discussion of current diabetes management, blood sugar patterns and problems identified  His A1c is now quite normal at 5.2  This is with only 1000 mg of metformin a day Despite not being consistent with diet or exercise he does not have any worsening of his diabetes control Discussed safety of metformin and since his renal function is quite normal recommend that he continue taking it for insulin resistance Because of his tendency to mildly increased fasting readings   HYPERTRIGLYCERIDEMIA: He has had persistent hypertriglyceridemia This is worse We will start fenofibrate back again and also add Vascepa 2 g twice daily   Need to check direct LDL if needed on the next visit  HYPERTENSION and mild CKD: His creatinine is improved Not clear if this is related to improve diet with lower protein intake for any effects of fenofibrate  ABNORMAL liver functions: Improved possibly from stopping niacin  PLAN:   He will try to be consistent with diet Encouraged him to do regular exercise with brisk walking No change in metformin To continue checking blood sugars 2 or 3 times a week at least at different times Will have him see nephrologist who can also check his urine protein that is needed to be checked  For his lipids discussed using fenofibrate which he still has at home and prescription for Vascepa sent Discussed benefits of Vascepa and co-pay card given  Total visit time for evaluation and management of multiple problems and counseling =25 minutes  There are no Patient Instructions on file for this visit.      Elayne Snare 11/10/2018, 10:22 AM   Note: This office note was prepared with Dragon voice recognition system  technology. Any transcriptional errors that result from this process are unintentional.

## 2018-11-10 NOTE — Addendum Note (Signed)
Addended by: Elayne Snare on: 11/10/2018 11:53 AM   Modules accepted: Orders

## 2018-11-11 ENCOUNTER — Ambulatory Visit: Payer: BLUE CROSS/BLUE SHIELD | Admitting: Endocrinology

## 2018-12-02 DIAGNOSIS — R809 Proteinuria, unspecified: Secondary | ICD-10-CM | POA: Diagnosis not present

## 2018-12-02 DIAGNOSIS — I129 Hypertensive chronic kidney disease with stage 1 through stage 4 chronic kidney disease, or unspecified chronic kidney disease: Secondary | ICD-10-CM | POA: Diagnosis not present

## 2018-12-02 DIAGNOSIS — N179 Acute kidney failure, unspecified: Secondary | ICD-10-CM | POA: Diagnosis not present

## 2018-12-02 DIAGNOSIS — N182 Chronic kidney disease, stage 2 (mild): Secondary | ICD-10-CM | POA: Diagnosis not present

## 2018-12-24 ENCOUNTER — Other Ambulatory Visit: Payer: Self-pay

## 2018-12-24 ENCOUNTER — Encounter: Payer: Self-pay | Admitting: Family Medicine

## 2018-12-24 ENCOUNTER — Ambulatory Visit: Payer: BLUE CROSS/BLUE SHIELD | Admitting: Family Medicine

## 2018-12-24 VITALS — BP 126/72 | HR 84 | Temp 98.9°F | Ht 72.5 in | Wt 254.0 lb

## 2018-12-24 DIAGNOSIS — E559 Vitamin D deficiency, unspecified: Secondary | ICD-10-CM | POA: Diagnosis not present

## 2018-12-24 DIAGNOSIS — N289 Disorder of kidney and ureter, unspecified: Secondary | ICD-10-CM

## 2018-12-24 DIAGNOSIS — Z8739 Personal history of other diseases of the musculoskeletal system and connective tissue: Secondary | ICD-10-CM

## 2018-12-24 DIAGNOSIS — E782 Mixed hyperlipidemia: Secondary | ICD-10-CM

## 2018-12-24 DIAGNOSIS — E118 Type 2 diabetes mellitus with unspecified complications: Secondary | ICD-10-CM | POA: Diagnosis not present

## 2018-12-24 NOTE — Progress Notes (Signed)
2/19/20209:50 AM  Ward Givens 03/02/1961, 58 y.o. male [id number]  Chief Complaint  Patient presents with  . Results  . Follow-up    says gout is well controlled    HPI:   Patient is a 58 y.o. male with past medical history significant for DM2, HLP, gout,  Vit D deficiency, renal insufficiency, h/o RCC  who presents today for routine followup  Last OV dec 2019  Since our last visit has seen renal at [facility] Creatinine has been trending back down, told he was stage 2 He ordered more labs for completeness sake not due to concerns Next followup 6 months  Patient Care Team: Rutherford Guys, MD as PCP - General (Family Medicine) Elayne Snare, MD as Consulting Physician (Endocrinology) Donato Heinz, MD as Consulting Physician (Nephrology)   He has not had any gout flare up over 5 years ago On max dose of allopurinol  Reports low testosterone about several years He reports that overall he feels well  Has not seen eye doctor yet, he is already scheduled for march 2020  Lab Results  Component Value Date   LABURIC 6.5 10/08/2018    Lab Results  Component Value Date   HGBA1C 5.2 11/06/2018   HGBA1C 5.6 08/01/2018   HGBA1C 5.5 05/09/2018   Lab Results  Component Value Date   [place] Comment 11/06/2018   CREATININE 1.09 11/06/2018    Fall Risk  10/08/2018 09/04/2018 01/16/2018 12/13/2017 10/30/2017  Falls in the past year? 0 No No No No     Depression screen [facility] 2/9 10/08/2018 09/04/2018 01/16/2018  Decreased Interest 0 0 0  Down, Depressed, Hopeless 0 0 0  PHQ - 2 Score 0 0 0    No Known Allergies  Prior to Admission medications   Medication Sig Start Date End Date Taking? Authorizing Provider  acetaminophen (TYLENOL) 500 MG tablet Take 500 mg by mouth every 6 (six) hours as needed for mild pain, fever or headache.   Yes [provider]  allopurinol (ZYLOPRIM) 300 MG tablet TAKE 1 TABLET BY MOUTH EVERY DAY 11/10/18  Yes Elayne Snare, MD    blood glucose meter kit and supplies Please check your blood pressure twice a day and keep a log. 11/13/17  Yes Jola Schmidt, MD  CONTOUR NEXT TEST test strip USE AS INSTRUCTED TO TEST BLOOD SUGARS 2 TIMES DAILY. DX CODE 04/28/18  Yes Elayne Snare, MD  glucose blood (CONTOUR NEXT TEST) test strip Use as instructed to test blood sugars 1 time daily 02/12/18  Yes Elayne Snare, MD  Icosapent Ethyl (VASCEPA) 1 g CAPS Take 2 capsules (2 g total) by mouth 2 (two) times daily. 11/10/18  Yes Elayne Snare, MD  Lancets MISC 1 each by Does not apply route 2 (two) times daily. To be used with Contour glucose meter. Dx: E11.8 12/31/17  Yes Elayne Snare, MD  lisinopril-hydrochlorothiazide (PRINZIDE,ZESTORETIC) 20-12.5 MG tablet TAKE 1 TABLET BY MOUTH EVERY DAY 10/27/18  Yes Rutherford Guys, MD  Magnesium 500 MG TABS Take 500 mg by mouth daily.   Yes [provider]  metFORMIN (GLUCOPHAGE) 1000 MG tablet Take 1 tablet (1,000 mg total) by mouth 2 (two) times daily with a meal. Patient taking differently: Take 1,000 mg by mouth daily. Take 1 tablet (1,088m) by mouth once daily in the evening. 12/13/17  Yes CTereasa Coop PA-C  omeprazole (PRILOSEC OTC) 20 MG tablet Take 20 mg by mouth daily.   Yes [provider]  Potassium Gluconate  595 MG CAPS Take 595 mg by mouth daily.   Yes [provider]    Past Medical History:  Diagnosis Date  . Cancer ([place]) 02/2004   ;Hx: of right kidney cancer; kidney removed in 2005  . Diabetes mellitus without complication ([place])   . GERD (gastroesophageal reflux disease)   . Gout    Hx: of  . Hyperlipidemia   . Hypertension     Past Surgical History:  Procedure Laterality Date  . Fx: [place] fx:  Marland Kitchen INGUINAL HERNIA REPAIR     age 17  . KIDNEY SURGERY  02/2004   Hx; of removal of right kidnety in 2005  . ORIF TIBIA PLATEAU Left 08/25/2013   Procedure: OPEN REDUCTION INTERNAL FIXATION (ORIF) TIBIAL PLATEAU;  Surgeon: Renette Butters, MD;   Location: [place];  Service: Orthopedics;  Laterality: Left;    Social History   Tobacco Use  . Smoking status: Former Smoker    Types: Cigarettes    Last attempt to quit: 03/24/2004    Years since quitting: 14.7  . Smokeless tobacco: Never Used  . Tobacco comment: quit smoking cigarettes in 2005  Substance Use Topics  . Alcohol use: Yes    Alcohol/week: 4.0 standard drinks    Types: 2 Cans of beer, 2 Standard drinks or equivalent per week    Family History  Problem Relation Age of Onset  . Heart disease Father   . Hyperlipidemia Brother   . Colon cancer Neg Hx   . Esophageal cancer Neg Hx   . Prostate cancer Neg Hx   . Rectal cancer Neg Hx   . Stomach cancer Neg Hx   . Diabetes Neg Hx     ROS Per hpi  OBJECTIVE:  Blood pressure 126/72, pulse 84, temperature 98.9 F (37.2 C), temperature source Oral, height 6' 0.5" (1.842 m), weight 254 lb (115.2 kg), SpO2 96 %. Body mass index is 33.98 kg/m.   Physical Exam Vitals signs and nursing note reviewed.  Constitutional:      Appearance: He is well-developed.  HENT:     Head: Normocephalic and atraumatic.  Eyes:     Conjunctiva/sclera: Conjunctivae normal.     Pupils: Pupils are equal, round, and reactive to light.  Neck:     Musculoskeletal: Neck supple.  Pulmonary:     Effort: Pulmonary effort is normal.  Skin:    General: Skin is warm and dry.  Neurological:     Mental Status: He is alert and oriented to person, place, and time.     ASSESSMENT and PLAN  1. History of gout Controlled. Uric acid very close to goal, on max dose of allopurinol. Has not had flare up in years.   2. Vitamin D deficiency Start OTC vitamin D 2000 units  3. Renal insufficiency Has seen renal. DM2 and HTN controlled. Avoid nephrotoxins.  4. Controlled type 2 diabetes mellitus with complication, without long-term current use of insulin ([place]) 5. Mixed hyperlipidemia Managed by endo  Return in about 6 months (around  06/24/2019).    Rutherford Guys, MD Primary Care at [place], [place] [address] Ph.  [phone] Fax [phone]

## 2018-12-24 NOTE — Patient Instructions (Signed)
° ° ° °  If you have lab work done today you will be contacted with your lab results within the next 2 weeks.  If you have not heard from us then please contact us. The fastest way to get your results is to register for My Chart. ° ° °IF you received an x-ray today, you will receive an invoice from [facility]. Please contact [facility] at [phone] with questions or concerns regarding your invoice.  ° °IF you received labwork today, you will receive an invoice from [facility]. Please contact [facility] at [phone] with questions or concerns regarding your invoice.  ° °Our billing staff will not be able to assist you with questions regarding bills from these companies. ° °You will be contacted with the lab results as soon as they are available. The fastest way to get your results is to activate your My Chart account. Instructions are located on the last page of this paperwork. If you have not heard from us regarding the results in 2 weeks, please contact this office. °  ° ° ° °

## 2019-01-06 ENCOUNTER — Other Ambulatory Visit: Payer: Self-pay | Admitting: Endocrinology

## 2019-01-26 ENCOUNTER — Other Ambulatory Visit: Payer: Self-pay | Admitting: Family Medicine

## 2019-01-26 NOTE — Telephone Encounter (Signed)
Requested Prescriptions  Pending Prescriptions Disp Refills  . lisinopril-hydrochlorothiazide (PRINZIDE,ZESTORETIC) 20-12.5 MG tablet [Pharmacy Med Name: LISINOPRIL-HCTZ 20-12.5 MG TAB] 90 tablet 0    Sig: TAKE 1 TABLET BY MOUTH EVERY DAY     Cardiovascular:  ACEI + Diuretic Combos Passed - 01/26/2019  1:53 AM      Passed - Na in normal range and within 180 days    Sodium  Date Value Ref Range Status  11/06/2018 136 134 - 144 mmol/L Final         Passed - K in normal range and within 180 days    Potassium  Date Value Ref Range Status  11/06/2018 4.3 3.5 - 5.2 mmol/L Final         Passed - Cr in normal range and within 180 days    Creat  Date Value Ref Range Status  04/27/2015 1.47 (H) 0.50 - 1.35 mg/dL Final   Creatinine, Ser  Date Value Ref Range Status  11/06/2018 1.09 0.76 - 1.27 mg/dL Final         Passed - Ca in normal range and within 180 days    Calcium  Date Value Ref Range Status  11/06/2018 9.2 8.7 - 10.2 mg/dL Final         Passed - Patient is not pregnant      Passed - Last BP in normal range    BP Readings from Last 1 Encounters:  12/24/18 126/72         Passed - Valid encounter within last 6 months    Recent Outpatient Visits          1 month ago History of gout   [facility], Lilia Argue, MD   3 months ago History of gout   [facility], Lilia Argue, MD   4 months ago Type 2 diabetes mellitus without complication, without long-term current use of insulin [facility])   [facility], Lilia Argue, MD   1 year ago Type 2 diabetes mellitus without complication, without long-term current use of insulin [facility])   Primary Care at [place], PA-C   1 year ago Sore throat   [facility], Audrie Lia, PA-C      Future Appointments            In 5 months Pamella Pert, Lilia Argue, MD Primary Care at [place], [facility]

## 2019-01-27 DIAGNOSIS — I129 Hypertensive chronic kidney disease with stage 1 through stage 4 chronic kidney disease, or unspecified chronic kidney disease: Secondary | ICD-10-CM | POA: Diagnosis not present

## 2019-01-27 DIAGNOSIS — N182 Chronic kidney disease, stage 2 (mild): Secondary | ICD-10-CM | POA: Diagnosis not present

## 2019-02-04 ENCOUNTER — Other Ambulatory Visit (INDEPENDENT_AMBULATORY_CARE_PROVIDER_SITE_OTHER): Payer: BLUE CROSS/BLUE SHIELD

## 2019-02-04 ENCOUNTER — Other Ambulatory Visit: Payer: Self-pay

## 2019-02-04 DIAGNOSIS — E118 Type 2 diabetes mellitus with unspecified complications: Secondary | ICD-10-CM | POA: Diagnosis not present

## 2019-02-04 DIAGNOSIS — E782 Mixed hyperlipidemia: Secondary | ICD-10-CM

## 2019-02-04 LAB — LIPID PANEL
Cholesterol: 166 mg/dL (ref 0–200)
HDL: 20.7 mg/dL — ABNORMAL LOW (ref >39.00)
NonHDL: 144.84
Total CHOL/HDL Ratio: 8
Triglycerides: 353 mg/dL — ABNORMAL HIGH (ref 0.0–149.0)
VLDL: 70.6 mg/dL — ABNORMAL HIGH (ref 0.0–40.0)

## 2019-02-04 LAB — MICROALBUMIN / CREATININE URINE RATIO
Creatinine,U: 158.5 mg/dL
Microalb Creat Ratio: 23.8 mg/g (ref 0.0–30.0)
Microalb, Ur: 37.8 mg/dL — ABNORMAL HIGH (ref 0.0–1.9)

## 2019-02-04 LAB — HEMOGLOBIN A1C: Hgb A1c MFr Bld: 5.8 % (ref 4.6–6.5)

## 2019-02-04 LAB — LDL CHOLESTEROL, DIRECT: Direct LDL: 99 mg/dL

## 2019-02-09 ENCOUNTER — Ambulatory Visit (INDEPENDENT_AMBULATORY_CARE_PROVIDER_SITE_OTHER): Payer: BLUE CROSS/BLUE SHIELD | Admitting: Endocrinology

## 2019-02-09 ENCOUNTER — Encounter: Payer: Self-pay | Admitting: Endocrinology

## 2019-02-09 DIAGNOSIS — E669 Obesity, unspecified: Secondary | ICD-10-CM

## 2019-02-09 DIAGNOSIS — N289 Disorder of kidney and ureter, unspecified: Secondary | ICD-10-CM

## 2019-02-09 DIAGNOSIS — I10 Essential (primary) hypertension: Secondary | ICD-10-CM | POA: Diagnosis not present

## 2019-02-09 DIAGNOSIS — E1169 Type 2 diabetes mellitus with other specified complication: Secondary | ICD-10-CM

## 2019-02-09 DIAGNOSIS — E782 Mixed hyperlipidemia: Secondary | ICD-10-CM

## 2019-02-09 NOTE — Progress Notes (Addendum)
Patient ID: Gordon Green, male   DOB: [birth year], 58 y.o.   MRN: [id number]          Reason for Appointment: Follow-up for various problems   Today's office visit was provided via telemedicine using video technique Explained to the patient and the the limitations of evaluation and management by telemedicine and the availability of in person appointments.  The patient understood the limitations and agreed to proceed. Patient also understood that the telehealth visit is billable. . Location of the patient: Home . Location of the provider: Office Only the patient and myself were participating in the encounter    History of Present Illness:          Date of diagnosis of type 2 diabetes mellitus: 11/2017        Background history:       He had presented to his urgent care center with symptoms of excessive thirst, urination, fatigue and blurred vision starting in late December following a significant respiratory infection His blood sugar was markedly increased and he was referred for admission, however was treated in the emergency room for his blood sugar of 409 with IV fluids and an injection of insulin.  He did not have any renal function abnormalities or acidosis He was sent home on metformin 1 g twice a day  His baseline A1c at diagnosis was 11.6   Recent history:  Non-insulin hypoglycemic drugs the patient is taking are: Metformin none at breakfast and 1 g dinner   A1c has been consistently normal, now 5.8 although previously 5.2  Current management, blood sugar patterns and problems identified:  His blood sugars are somewhat variable at home and have been as high as 170  Some of his readings are high even when checked fasting, previously his blood sugars have been below 130 consistently  He says that he is getting more carbohydrates in his diet and more bread compared to before  Also only a couple of weeks ago has started doing some walking for exercise  Still taking  only metformin in the evenings, previously had nausea if he had taken some in the morning also  However he is sometimes taking it a couple of hours after eating  Has only a couple of readings in the evenings in the last month and these are fairly good        Side effects from medications have been: Nausea with 1 g metformin in the morning    Glucose monitoring:   Frequency of monitoring    less than once a day    Glucometer:  Contour .      Blood Glucose readings from monitor download  MORNING blood sugar readings: Before breakfast 102-122 After breakfast 129-170  PC recently 139, 88 Overall average 123 MORNING average 135       Dietician visit, most recent: 01/2018               Exercise:  He is doing walking or other activities, mostly in the last 2 weeks  Weight history:  Wt Readings from Last 3 Encounters:  12/24/18 254 lb (115.2 kg)  11/10/18 254 lb 6.4 oz (115.4 kg)  10/08/18 250 lb 3.2 oz (113.5 kg)    Glycemic control:   Lab Results  Component Value Date   HGBA1C 5.8 02/04/2019   HGBA1C 5.2 11/06/2018   HGBA1C 5.6 08/01/2018   Lab Results  Component Value Date   MICROALBUR 37.8 (H) 02/04/2019   [place] Comment 11/06/2018  CREATININE 1.09 11/06/2018   Lab Results  Component Value Date   MICRALBCREAT 23.8 02/04/2019    No results found for: FRUCTOSAMINE    Allergies as of 02/09/2019   No Known Allergies     Medication List       Accurate as of February 09, 2019  9:45 AM. Always use your most recent med list.        acetaminophen 500 MG tablet Commonly known as:  TYLENOL Take 500 mg by mouth every 6 (six) hours as needed for mild pain, fever or headache.   allopurinol 300 MG tablet Commonly known as:  ZYLOPRIM TAKE 1 TABLET BY MOUTH EVERY DAY   blood glucose meter kit and supplies Please check your blood pressure twice a day and keep a log.   fenofibrate 145 MG tablet Commonly known as:  TRICOR Take 145 mg by mouth daily. Take 1 tablet by  mouth once daily in the morning.   glucose blood test strip Commonly known as:  Contour Next Test Use as instructed to test blood sugars 1 time daily   Contour Next Test test strip Generic drug:  glucose blood USE AS INSTRUCTED TO TEST BLOOD SUGARS 2 TIMES DAILY. DX CODE   Lancets Misc 1 each by Does not apply route 2 (two) times daily. To be used with Contour glucose meter. Dx: E11.8   Magnesium 500 MG Tabs Take 500 mg by mouth daily.   metFORMIN 1000 MG tablet Commonly known as:  GLUCOPHAGE Take 1,000 mg by mouth every evening. Take 1 tablet by mouth once daily in the evening.   omeprazole 20 MG tablet Commonly known as:  PRILOSEC OTC Take 20 mg by mouth daily.   Potassium Gluconate 595 MG Caps Take 595 mg by mouth daily.   Vascepa 1 g Caps Generic drug:  Icosapent Ethyl TAKE 2 CAPSULES (2 G TOTAL) BY MOUTH 2 (TWO) TIMES DAILY.       Allergies: No Known Allergies  Past Medical History:  Diagnosis Date  . Cancer ([place]) 02/2004   ;Hx: of right kidney cancer; kidney removed in 2005  . Diabetes mellitus without complication ([place])   . GERD (gastroesophageal reflux disease)   . Gout    Hx: of  . Hyperlipidemia   . Hypertension     Past Surgical History:  Procedure Laterality Date  . Fx: [place] fx:  Marland Kitchen INGUINAL HERNIA REPAIR     age 64  . KIDNEY SURGERY  02/2004   Hx; of removal of right kidnety in 2005  . ORIF TIBIA PLATEAU Left 08/25/2013   Procedure: OPEN REDUCTION INTERNAL FIXATION (ORIF) TIBIAL PLATEAU;  Surgeon: Renette Butters, MD;  Location: [place];  Service: Orthopedics;  Laterality: Left;    Family History  Problem Relation Age of Onset  . Heart disease Father   . Hyperlipidemia Brother   . Colon cancer Neg Hx   . Esophageal cancer Neg Hx   . Prostate cancer Neg Hx   . Rectal cancer Neg Hx   . Stomach cancer Neg Hx   . Diabetes Neg Hx     Social History:  reports that he quit smoking about 14 years ago. His smoking use included  cigarettes. He has never used smokeless tobacco. He reports current alcohol use of about 4.0 standard drinks of alcohol per week. He reports that he does not use drugs.   Review of Systems   Lipid history:   He had been restarted on  fenofibrate along with Vascepa in 1/20 Previously had baseline triglycerides over 800 LDL has been consistently below 100 without a statin drug  Fasting triglycerides are improved but still over 300 He says that sometimes he will forget to take a Vascepa in the evenings His weight is slightly about the same Recently not consistent with diet with somewhat more carbohydrate intake  Not clear if he may have had higher LFTs related to niacin previously   Lab Results  Component Value Date   CHOL 166 02/04/2019   CHOL 187 11/06/2018   CHOL 188 08/01/2018   Lab Results  Component Value Date   HDL 20.70 (L) 02/04/2019   HDL 24 (L) 11/06/2018   HDL 28 (L) 08/01/2018   Lab Results  Component Value Date   LDLCALC Comment 11/06/2018   LDLCALC 104 (H) 08/01/2018   [place] Comment 05/09/2018   Lab Results  Component Value Date   TRIG 353.0 (H) 02/04/2019   TRIG 577 (HH) 11/06/2018   TRIG 281 (H) 08/01/2018   Lab Results  Component Value Date   CHOLHDL 8 02/04/2019   CHOLHDL 7.8 (H) 11/06/2018   CHOLHDL 6.7 (H) 08/01/2018   Lab Results  Component Value Date   LDLDIRECT 99.0 02/04/2019            Hypertension: On treatment since 2005 Was taking lisinopril HCTZ prescribed by nephrologist However the local nephrologist told him to stop this He thinks his blood pressure has been higher in the morning and up to 140/90 although usually better in the afternoons  Also has proteinuria and history of nephrectomy Microalbumin is last normal in 4/20   BP Readings from Last 3 Encounters:  12/24/18 126/72  11/10/18 134/76  10/08/18 133/81   Has borderline renal function  He was told by the nephrologist to stop lisinopril HCTZ but not clear what  his creatinine level was  Lab Results  Component Value Date   CREATININE 1.09 11/06/2018   CREATININE 1.23 10/08/2018   CREATININE 1.45 (H) 08/01/2018    Most recent eye exam:?  Most recent foot exam: 2/20     LABS:  Lab on 02/04/2019  Component Date Value Ref Range Status  . Hgb A1c MFr Bld 02/04/2019 5.8  4.6 - 6.5 % Final   Glycemic Control Guidelines for People with Diabetes:Non Diabetic:  <6%Goal of Therapy: <7%Additional Action Suggested:  >8%   . Cholesterol 02/04/2019 166  0 - 200 mg/dL Final   ATP III Classification       Desirable:  < 200 mg/dL               Borderline High:  200 - 239 mg/dL          High:  > = 240 mg/dL  . Triglycerides 02/04/2019 353.0* 0.0 - 149.0 mg/dL Final   Normal:  <150 mg/dLBorderline High:  150 - 199 mg/dL  . HDL 02/04/2019 20.70* >39.00 mg/dL Final  . VLDL 02/04/2019 70.6* 0.0 - 40.0 mg/dL Final  . Total CHOL/HDL Ratio 02/04/2019 8   Final                  Men          Women1/2 Average Risk     3.4          3.3Average Risk          5.0          4.42X Average Risk          9.6  7.13X Average Risk          15.0          11.0                      . NonHDL 02/04/2019 144.84   Final   NOTE:  Non-HDL goal should be 30 mg/dL higher than patient's LDL goal (i.e. LDL goal of < 70 mg/dL, would have non-HDL goal of < 100 mg/dL)  . Microalb, Ur 02/04/2019 37.8* 0.0 - 1.9 mg/dL Final  . Creatinine,U 02/04/2019 158.5  mg/dL Final  . Microalb Creat Ratio 02/04/2019 23.8  0.0 - 30.0 mg/g Final  . Direct LDL 02/04/2019 99.0  mg/dL Final   Optimal:  <100 mg/dLNear or Above Optimal:  100-129 mg/dLBorderline High:  130-159 mg/dLHigh:  160-189 mg/dLVery High:  >190 mg/dL    Physical Examination:  There were no vitals taken for this visit.  Exam not possible today    ASSESSMENT:  Diabetes type 2 with obesity, on metformin  See history of present illness for detailed discussion of current diabetes management, blood sugar patterns and problems  identified  His A1c is now quite normal at 5.2  This is with only 1000 mg of metformin a day Despite not being consistent with diet or exercise he does not have any worsening of his diabetes control Discussed safety of metformin and since his renal function is quite normal recommend that he continue taking it for insulin resistance Because of his tendency to mildly increased fasting readings   HYPERTRIGLYCERIDEMIA: He has had persistent hypertriglyceridemia This is still not controlled with triglycerides 353 This may be partly related to continued insulin resistance, not taking Vascepa consistently in evening as well as inconsistent diet with more carbohydrates and high-fat foods LDL is below 100  HYPERTENSION and mild CKD:  Now managed by nephrologist However blood pressure appears to be higher at home at times especially in the mornings but not taking any medications as suggested by nephrologist Records from nephrologist are awaited Currently microalbumin is normal   PLAN:   Since A1c is still normal will not increase metformin He will try to cut back on higher carbohydrate and high-fat foods Discussed role of rate limiting carbohydrates and fats with triglycerides also  Hopefully will have better control of various factors with consistent exercise  He can take his Vascepa more consistently right at the time he takes metformin However may consider a statin drug also if triglycerides are difficult to control  For his blood pressure he may simply try HCTZ instead of combination with lisinopril and he will call his nephrologist about this Recheck fasting lipids and chemistry in 3 months  Renal dysfunction: Not clear what his creatinine level is.  Usually do not expect fenofibrate to cause higher creatinine level if baseline is normal but will defer this question to nephrologist  Total visit time for evaluation and management of multiple problems and counseling =25 minutes  There  are no Patient Instructions on file for this visit.      Elayne Snare 02/09/2019, 9:45 AM   Note: This office note was prepared with Dragon voice recognition system technology. Any transcriptional errors that result from this process are unintentional.

## 2019-02-11 ENCOUNTER — Other Ambulatory Visit: Payer: Self-pay | Admitting: Family Medicine

## 2019-02-11 NOTE — Telephone Encounter (Signed)
Requested medication (s) are due for refill today notes on refill - report Rx ordered 2 days ago by historical provider  Requested medication (s) are on the active medication list -yes  Future visit scheduled -yes  Last refill: 2 days ago  Notes to clinic: patient is requesting a Rx filled by historical provider. Sent for PCP review of request  Requested Prescriptions  Pending Prescriptions Disp Refills   metFORMIN (GLUCOPHAGE) 1000 MG tablet [Pharmacy Med Name: METFORMIN HCL 1,000 MG TABLET] 180 tablet 3    Sig: TAKE 1 TABLET (1,000 MG TOTAL) BY MOUTH 2 (TWO) TIMES DAILY WITH A MEAL.     Endocrinology:  Diabetes - Biguanides Passed - 02/11/2019  9:55 AM      Passed - Cr in normal range and within 360 days    Creat  Date Value Ref Range Status  04/27/2015 1.47 (H) 0.50 - 1.35 mg/dL Final   Creatinine, Ser  Date Value Ref Range Status  11/06/2018 1.09 0.76 - 1.27 mg/dL Final         Passed - HBA1C is between 0 and 7.9 and within 180 days    Hgb A1c MFr Bld  Date Value Ref Range Status  02/04/2019 5.8 4.6 - 6.5 % Final    Comment:    Glycemic Control Guidelines for People with Diabetes:Non Diabetic:  <6%Goal of Therapy: <7%Additional Action Suggested:  >8%          Passed - eGFR in normal range and within 360 days    GFR calc Af Amer  Date Value Ref Range Status  11/06/2018 87 >59 mL/min/1.73 Final   GFR calc non Af Amer  Date Value Ref Range Status  11/06/2018 75 >59 mL/min/1.73 Final         Passed - Valid encounter within last 6 months    Recent Outpatient Visits          1 month ago History of gout   [facility], Lilia Argue, MD   4 months ago History of gout   [facility], Lilia Argue, MD   5 months ago Type 2 diabetes mellitus without complication, without long-term current use of insulin [facility])   [facility], Lilia Argue, MD   1 year ago Type 2 diabetes mellitus without complication, without long-term current use of  insulin [facility])   [facility] [place], PA-C   1 year ago Sore throat   [facility], Audrie Lia, PA-C      Future Appointments            In 4 months Rutherford Guys, MD [facility] at [place], [facility]            Requested Prescriptions  Pending Prescriptions Disp Refills   metFORMIN (GLUCOPHAGE) 1000 MG tablet [Pharmacy Med Name: METFORMIN HCL 1,000 MG TABLET] 180 tablet 3    Sig: TAKE 1 TABLET (1,000 MG TOTAL) BY MOUTH 2 (TWO) TIMES DAILY WITH A MEAL.     Endocrinology:  Diabetes - Biguanides Passed - 02/11/2019  9:55 AM      Passed - Cr in normal range and within 360 days    Creat  Date Value Ref Range Status  04/27/2015 1.47 (H) 0.50 - 1.35 mg/dL Final   Creatinine, Ser  Date Value Ref Range Status  11/06/2018 1.09 0.76 - 1.27 mg/dL Final         Passed - HBA1C is between  0 and 7.9 and within 180 days    Hgb A1c MFr Bld  Date Value Ref Range Status  02/04/2019 5.8 4.6 - 6.5 % Final    Comment:    Glycemic Control Guidelines for People with Diabetes:Non Diabetic:  <6%Goal of Therapy: <7%Additional Action Suggested:  >8%          Passed - eGFR in normal range and within 360 days    GFR calc Af Amer  Date Value Ref Range Status  11/06/2018 87 >59 mL/min/1.73 Final   GFR calc non Af Amer  Date Value Ref Range Status  11/06/2018 75 >59 mL/min/1.73 Final         Passed - Valid encounter within last 6 months    Recent Outpatient Visits          1 month ago History of gout   [facility], Lilia Argue, MD   4 months ago History of gout   [facility], Lilia Argue, MD   5 months ago Type 2 diabetes mellitus without complication, without long-term current use of insulin [facility])   [facility], Lilia Argue, MD   1 year ago Type 2 diabetes mellitus without complication, without long-term current use of insulin [facility])   [facility] [place], PA-C   1 year ago Sore throat    [facility], Audrie Lia, PA-C      Future Appointments            In 4 months Rutherford Guys, MD Primary [facility] at [place], [facility]

## 2019-02-20 ENCOUNTER — Other Ambulatory Visit: Payer: Self-pay | Admitting: Physician Assistant

## 2019-04-06 DIAGNOSIS — E119 Type 2 diabetes mellitus without complications: Secondary | ICD-10-CM | POA: Diagnosis not present

## 2019-04-06 DIAGNOSIS — H524 Presbyopia: Secondary | ICD-10-CM | POA: Diagnosis not present

## 2019-04-06 DIAGNOSIS — H40013 Open angle with borderline findings, low risk, bilateral: Secondary | ICD-10-CM | POA: Diagnosis not present

## 2019-04-06 DIAGNOSIS — H35013 Changes in retinal vascular appearance, bilateral: Secondary | ICD-10-CM | POA: Diagnosis not present

## 2019-04-06 DIAGNOSIS — H2513 Age-related nuclear cataract, bilateral: Secondary | ICD-10-CM | POA: Diagnosis not present

## 2019-04-06 LAB — HM DIABETES EYE EXAM

## 2019-04-08 ENCOUNTER — Other Ambulatory Visit: Payer: Self-pay | Admitting: Endocrinology

## 2019-05-01 ENCOUNTER — Other Ambulatory Visit: Payer: Self-pay | Admitting: Endocrinology

## 2019-05-01 NOTE — Telephone Encounter (Signed)
Okay to refill, needs follow-up appointment with labs in a month

## 2019-05-01 NOTE — Telephone Encounter (Signed)
Refill or have PCP manage?

## 2019-05-29 ENCOUNTER — Other Ambulatory Visit (INDEPENDENT_AMBULATORY_CARE_PROVIDER_SITE_OTHER): Payer: BC Managed Care – PPO

## 2019-05-29 ENCOUNTER — Other Ambulatory Visit: Payer: Self-pay

## 2019-05-29 DIAGNOSIS — E669 Obesity, unspecified: Secondary | ICD-10-CM

## 2019-05-29 DIAGNOSIS — E1169 Type 2 diabetes mellitus with other specified complication: Secondary | ICD-10-CM | POA: Diagnosis not present

## 2019-05-29 DIAGNOSIS — E782 Mixed hyperlipidemia: Secondary | ICD-10-CM

## 2019-05-29 LAB — COMPREHENSIVE METABOLIC PANEL
ALT: 40 U/L (ref 0–53)
AST: 26 U/L (ref 0–37)
Albumin: 4.5 g/dL (ref 3.5–5.2)
Alkaline Phosphatase: 62 U/L (ref 39–117)
BUN: 23 mg/dL (ref 6–23)
CO2: 30 mEq/L (ref 19–32)
Calcium: 10.1 mg/dL (ref 8.4–10.5)
Chloride: 101 mEq/L (ref 96–112)
Creatinine, Ser: 1.35 mg/dL (ref 0.40–1.50)
GFR: 54.42 mL/min — ABNORMAL LOW (ref >60.00)
Glucose, Bld: 107 mg/dL — ABNORMAL HIGH (ref 70–99)
Potassium: 5.1 mEq/L (ref 3.5–5.1)
Sodium: 137 mEq/L (ref 135–145)
Total Bilirubin: 0.5 mg/dL (ref 0.2–1.2)
Total Protein: 7.5 g/dL (ref 6.0–8.3)

## 2019-05-29 LAB — LIPID PANEL
Cholesterol: 184 mg/dL (ref 0–200)
HDL: 27 mg/dL — ABNORMAL LOW (ref >39.00)
Total CHOL/HDL Ratio: 7
Triglycerides: 413 mg/dL — ABNORMAL HIGH (ref 0.0–149.0)

## 2019-05-29 LAB — LDL CHOLESTEROL, DIRECT: Direct LDL: 99 mg/dL

## 2019-05-29 LAB — HEMOGLOBIN A1C: Hgb A1c MFr Bld: 5.9 % (ref 4.6–6.5)

## 2019-06-02 ENCOUNTER — Ambulatory Visit: Payer: BLUE CROSS/BLUE SHIELD | Admitting: Endocrinology

## 2019-06-04 ENCOUNTER — Other Ambulatory Visit: Payer: Self-pay

## 2019-06-04 ENCOUNTER — Other Ambulatory Visit: Payer: Self-pay | Admitting: Endocrinology

## 2019-06-04 ENCOUNTER — Encounter: Payer: Self-pay | Admitting: Endocrinology

## 2019-06-04 ENCOUNTER — Ambulatory Visit (INDEPENDENT_AMBULATORY_CARE_PROVIDER_SITE_OTHER): Payer: BC Managed Care – PPO | Admitting: Endocrinology

## 2019-06-04 DIAGNOSIS — I10 Essential (primary) hypertension: Secondary | ICD-10-CM | POA: Diagnosis not present

## 2019-06-04 DIAGNOSIS — N289 Disorder of kidney and ureter, unspecified: Secondary | ICD-10-CM

## 2019-06-04 DIAGNOSIS — E782 Mixed hyperlipidemia: Secondary | ICD-10-CM

## 2019-06-04 DIAGNOSIS — E1169 Type 2 diabetes mellitus with other specified complication: Secondary | ICD-10-CM

## 2019-06-04 DIAGNOSIS — E669 Obesity, unspecified: Secondary | ICD-10-CM

## 2019-06-04 MED ORDER — VASCEPA 1 G PO CAPS: ORAL_CAPSULE | ORAL | 2 refills | Status: DC

## 2019-06-04 NOTE — Progress Notes (Signed)
Patient ID: Gordon Green, male   DOB: [birth year], 58 y.o.   MRN: [id number]          Reason for Appointment: Follow-up for various problems   Today's office visit was provided via telemedicine using video technique Explained to the patient and the the limitations of evaluation and management by telemedicine and the availability of in person appointments.  The patient understood the limitations and agreed to proceed. Patient also understood that the telehealth visit is billable. . Location of the patient: Home . Location of the provider: Office Only the patient and myself were participating in the encounter    History of Present Illness:          Date of diagnosis of type 2 diabetes mellitus: 11/2017        Background history:       He had presented to his urgent care center with symptoms of excessive thirst, urination, fatigue and blurred vision starting in late December following a significant respiratory infection His blood sugar was markedly increased and he was referred for admission, however was treated in the emergency room for his blood sugar of 409 with IV fluids and an injection of insulin.  He did not have any renal function abnormalities or acidosis He was sent home on metformin 1 g twice a day  His baseline A1c at diagnosis was 11.6   Recent history:  Non-insulin hypoglycemic drugs the patient is taking are: Metformin none at breakfast and 1 g dinner   A1c has been consistently normal, now  5.8   Current management, blood sugar patterns and problems identified:  He had been recommended starting back on an exercise regimen on his last visit in April  He says he is trying to do more walking and some hiking when he does get significant exercise  His weight is only down to about 250 however  He says he is sometimes having to eat fast food because of traveling to [place] and likely can do better with diet  He is not always restricting carbohydrate portions  although avoiding simple sugars and getting more whole-grain breads  His blood sugars are mildly increased as before the morning but he does not have very many readings after meals and highest blood sugar after breakfast was 157        Side effects from medications have been: Nausea with 1 g metformin in the morning    Glucose monitoring:   Frequency of monitoring    less than once a day    Glucometer:  Contour .      Blood Glucose readings from monitor    PRE-MEAL Fasting Lunch Dinner Bedtime Overall  Glucose range:  119-146   149    Mean/median:     ?   POST-MEAL PC Breakfast PC Lunch PC Dinner  Glucose range:  157   139  Mean/median:       Previous average blood sugar 123     Dietician visit, most recent: 01/2018               Exercise:  He is doing walking or other activities, mostly in the last 2 weeks  Weight history:  Wt Readings from Last 3 Encounters:  12/24/18 254 lb (115.2 kg)  11/10/18 254 lb 6.4 oz (115.4 kg)  10/08/18 250 lb 3.2 oz (113.5 kg)    Glycemic control:   Lab Results  Component Value Date   HGBA1C 5.9 05/29/2019   HGBA1C 5.8 02/04/2019  HGBA1C 5.2 11/06/2018   Lab Results  Component Value Date   MICROALBUR 37.8 (H) 02/04/2019   [place] Comment 11/06/2018   CREATININE 1.35 05/29/2019   Lab Results  Component Value Date   MICRALBCREAT 23.8 02/04/2019    No results found for: FRUCTOSAMINE    Allergies as of 06/04/2019   No Known Allergies     Medication List       Accurate as of June 04, 2019  3:00 PM. If you have any questions, ask your nurse or doctor.        acetaminophen 500 MG tablet Commonly known as: TYLENOL Take 500 mg by mouth every 6 (six) hours as needed for mild pain, fever or headache.   allopurinol 300 MG tablet Commonly known as: ZYLOPRIM TAKE 1 TABLET BY MOUTH EVERY DAY   blood glucose meter kit and supplies Please check your blood pressure twice a day and keep a log.   fenofibrate 145 MG tablet  Commonly known as: TRICOR Take 145 mg by mouth daily. Take 1 tablet by mouth once daily in the morning.   glucose blood test strip Commonly known as: Contour Next Test Use as instructed to test blood sugars 1 time daily   Contour Next Test test strip Generic drug: glucose blood USE AS INSTRUCTED TO TEST BLOOD SUGARS 2 TIMES DAILY. DX CODE   Lancets Misc 1 each by Does not apply route 2 (two) times daily. To be used with Contour glucose meter. Dx: E11.8   Magnesium 500 MG Tabs Take 500 mg by mouth daily.   metFORMIN 1000 MG tablet Commonly known as: GLUCOPHAGE TAKE 1 TABLET (1,000 MG TOTAL) BY MOUTH 2 (TWO) TIMES DAILY WITH A MEAL. What changed: See the new instructions.   omeprazole 20 MG tablet Commonly known as: PRILOSEC OTC Take 20 mg by mouth daily.   Potassium Gluconate 595 MG Caps Take 595 mg by mouth daily.   Vascepa 1 g Caps Generic drug: Icosapent Ethyl TAKE 2 CAPSULES (2 G TOTAL) BY MOUTH 2 (TWO) TIMES DAILY.       Allergies: No Known Allergies  Past Medical History:  Diagnosis Date  . Cancer ([place]) 02/2004   ;Hx: of right kidney cancer; kidney removed in 2005  . Diabetes mellitus without complication ([place])   . GERD (gastroesophageal reflux disease)   . Gout    Hx: of  . Hyperlipidemia   . Hypertension     Past Surgical History:  Procedure Laterality Date  . Fx: [place] fx:  Marland Kitchen INGUINAL HERNIA REPAIR     age 39  . KIDNEY SURGERY  02/2004   Hx; of removal of right kidnety in 2005  . ORIF TIBIA PLATEAU Left 08/25/2013   Procedure: OPEN REDUCTION INTERNAL FIXATION (ORIF) TIBIAL PLATEAU;  Surgeon: Renette Butters, MD;  Location: [facility];  Service: Orthopedics;  Laterality: Left;    Family History  Problem Relation Age of Onset  . Heart disease Father   . Hyperlipidemia Brother   . Colon cancer Neg Hx   . Esophageal cancer Neg Hx   . Prostate cancer Neg Hx   . Rectal cancer Neg Hx   . Stomach cancer Neg Hx   . Diabetes Neg Hx      Social History:  reports that he quit smoking about 15 years ago. His smoking use included cigarettes. He has never used smokeless tobacco. He reports current alcohol use of about 4.0 standard drinks of alcohol per week. He  reports that he does not use drugs.   Review of Systems   Lipid history:   He had been on fenofibrate along with Vascepa since 11/2018 Previously had baseline triglycerides over 800 LDL has been consistently below 100 without a statin drug  Fasting triglycerides are now higher than before, previously still over 300 He says that his pharmacy did not get a refill for his Vascepa about 3 weeks ago However he thinks he is able to take this more consistently twice a day now His diet can be somewhat better with less fast food and overall portions of carbohydrates   Not clear if he may have had higher LFTs related to niacin previously   Lab Results  Component Value Date   CHOL 184 05/29/2019   CHOL 166 02/04/2019   CHOL 187 11/06/2018   Lab Results  Component Value Date   HDL 27.00 (L) 05/29/2019   HDL 20.70 (L) 02/04/2019   HDL 24 (L) 11/06/2018   Lab Results  Component Value Date   LDLCALC Comment 11/06/2018   LDLCALC 104 (H) 08/01/2018   [place] Comment 05/09/2018   Lab Results  Component Value Date   TRIG (H) 05/29/2019    413.0 Triglyceride is over 400; calculations on Lipids are invalid.   TRIG 353.0 (H) 02/04/2019   TRIG 577 (HH) 11/06/2018   Lab Results  Component Value Date   CHOLHDL 7 05/29/2019   CHOLHDL 8 02/04/2019   CHOLHDL 7.8 (H) 11/06/2018   Lab Results  Component Value Date   LDLDIRECT 99.0 05/29/2019   LDLDIRECT 99.0 02/04/2019            Hypertension: On treatment since 2005 Was taking lisinopril HCTZ which was stopped by nephrologist He has been reporting to his nephrologist with his blood pressure readings and has not been told to take any medication Although he has had blood pressure readings as low as 111/70 today  because 137/84  Also has proteinuria and history of nephrectomy Microalbumin is last normal in 4/20   BP Readings from Last 3 Encounters:  12/24/18 126/72  11/10/18 134/76  10/08/18 133/81   Has borderline renal function  Not clear why her creatinine is slightly higher now  Lab Results  Component Value Date   CREATININE 1.35 05/29/2019   CREATININE 1.09 11/06/2018   CREATININE 1.23 10/08/2018    Most recent eye exam: 04/2019  Most recent foot exam: 2/20     LABS:  Lab on 05/29/2019  Component Date Value Ref Range Status  . Cholesterol 05/29/2019 184  0 - 200 mg/dL Final   ATP III Classification       Desirable:  < 200 mg/dL               Borderline High:  200 - 239 mg/dL          High:  > = 240 mg/dL  . Triglycerides 05/29/2019 413.0 Triglyceride is over 400; calculations on Lipids are invalid.* 0.0 - 149.0 mg/dL Final   Normal:  <150 mg/dLBorderline High:  150 - 199 mg/dL  . HDL 05/29/2019 27.00* >39.00 mg/dL Final  . Total CHOL/HDL Ratio 05/29/2019 7   Final                  Men          Women1/2 Average Risk     3.4          3.3Average Risk          5.0  4.42X Average Risk          9.6          7.13X Average Risk          15.0          11.0                      . Sodium 05/29/2019 137  135 - 145 mEq/L Final  . Potassium 05/29/2019 5.1  3.5 - 5.1 mEq/L Final  . Chloride 05/29/2019 101  96 - 112 mEq/L Final  . CO2 05/29/2019 30  19 - 32 mEq/L Final  . Glucose, Bld 05/29/2019 107* 70 - 99 mg/dL Final  . BUN 05/29/2019 23  6 - 23 mg/dL Final  . Creatinine, Ser 05/29/2019 1.35  0.40 - 1.50 mg/dL Final  . Total Bilirubin 05/29/2019 0.5  0.2 - 1.2 mg/dL Final  . Alkaline Phosphatase 05/29/2019 62  39 - 117 U/L Final  . AST 05/29/2019 26  0 - 37 U/L Final  . ALT 05/29/2019 40  0 - 53 U/L Final  . Total Protein 05/29/2019 7.5  6.0 - 8.3 g/dL Final  . Albumin 05/29/2019 4.5  3.5 - 5.2 g/dL Final  . Calcium 05/29/2019 10.1  8.4 - 10.5 mg/dL Final  . GFR 05/29/2019  54.42* >60.00 mL/min Final  . Hgb A1c MFr Bld 05/29/2019 5.9  4.6 - 6.5 % Final   Glycemic Control Guidelines for People with Diabetes:Non Diabetic:  <6%Goal of Therapy: <7%Additional Action Suggested:  >8%   . Direct LDL 05/29/2019 99.0  mg/dL Final   Optimal:  <100 mg/dLNear or Above Optimal:  100-129 mg/dLBorderline High:  130-159 mg/dLHigh:  160-189 mg/dLVery High:  >190 mg/dL    Physical Examination:  There were no vitals taken for this visit.      ASSESSMENT:  Diabetes type 2 with obesity, on metformin  See history of present illness for detailed discussion of current diabetes management, blood sugar patterns and problems identified  His A1c is still below 6  As before he tends to have mildly increased fasting readings although he has not done enough readings after meals also He has done better with his exercise regimen However he thinks he can still do better with diet as discussed above  He will continue with regimen of metformin 1000 mg a day   HYPERTRIGLYCERIDEMIA: He has had persistent hypertriglyceridemia This is still not controlled with triglycerides now over 400 Although he has taken Vascepa he ran out 3 weeks ago Also diet can be somewhat better LDL is just below 100  HYPERTENSION and mild CKD:  Now managed by nephrologist Blood pressure is variable and he monitors at home and will be following up with nephrologist in about 6 weeks  PLAN:   Consistent diet as above More readings after meals Prescription for Vascepa has been sent again and fenofibrate refilled Recheck lipids in about 6 weeks with restarting Vascepa and consider adding a statin drug if triglycerides still over 250 also  Renal dysfunction: This has been variable and will await nephrology consultation  There are no Patient Instructions on file for this visit.      Elayne Snare 06/04/2019, 3:00 PM   Note: This office note was prepared with Dragon voice recognition system technology. Any  transcriptional errors that result from this process are unintentional.

## 2019-06-26 ENCOUNTER — Ambulatory Visit: Payer: BLUE CROSS/BLUE SHIELD | Admitting: Family Medicine

## 2019-07-14 ENCOUNTER — Other Ambulatory Visit: Payer: Self-pay

## 2019-07-16 ENCOUNTER — Other Ambulatory Visit: Payer: BC Managed Care – PPO

## 2019-07-20 DIAGNOSIS — I129 Hypertensive chronic kidney disease with stage 1 through stage 4 chronic kidney disease, or unspecified chronic kidney disease: Secondary | ICD-10-CM | POA: Diagnosis not present

## 2019-07-20 DIAGNOSIS — R809 Proteinuria, unspecified: Secondary | ICD-10-CM | POA: Diagnosis not present

## 2019-07-20 DIAGNOSIS — N182 Chronic kidney disease, stage 2 (mild): Secondary | ICD-10-CM | POA: Diagnosis not present

## 2019-07-20 DIAGNOSIS — Z23 Encounter for immunization: Secondary | ICD-10-CM | POA: Diagnosis not present

## 2019-07-20 DIAGNOSIS — N179 Acute kidney failure, unspecified: Secondary | ICD-10-CM | POA: Diagnosis not present

## 2019-07-20 DIAGNOSIS — E1122 Type 2 diabetes mellitus with diabetic chronic kidney disease: Secondary | ICD-10-CM | POA: Diagnosis not present

## 2019-08-19 DIAGNOSIS — N182 Chronic kidney disease, stage 2 (mild): Secondary | ICD-10-CM | POA: Diagnosis not present

## 2019-08-19 DIAGNOSIS — I129 Hypertensive chronic kidney disease with stage 1 through stage 4 chronic kidney disease, or unspecified chronic kidney disease: Secondary | ICD-10-CM | POA: Diagnosis not present

## 2019-08-21 ENCOUNTER — Other Ambulatory Visit: Payer: Self-pay | Admitting: Family Medicine

## 2019-08-21 NOTE — Telephone Encounter (Signed)
Forwarding medication refill request to the clinical pool for review. 

## 2019-10-05 ENCOUNTER — Other Ambulatory Visit: Payer: BC Managed Care – PPO

## 2019-10-07 ENCOUNTER — Ambulatory Visit: Payer: BC Managed Care – PPO | Admitting: Endocrinology

## 2019-10-22 ENCOUNTER — Other Ambulatory Visit: Payer: Self-pay

## 2019-10-22 ENCOUNTER — Other Ambulatory Visit (INDEPENDENT_AMBULATORY_CARE_PROVIDER_SITE_OTHER): Payer: BC Managed Care – PPO

## 2019-10-22 DIAGNOSIS — E782 Mixed hyperlipidemia: Secondary | ICD-10-CM

## 2019-10-22 LAB — LIPID PANEL
Cholesterol: 189 mg/dL (ref 0–200)
HDL: 24.2 mg/dL — ABNORMAL LOW (ref >39.00)
Total CHOL/HDL Ratio: 8
Triglycerides: 428 mg/dL — ABNORMAL HIGH (ref 0.0–149.0)

## 2019-10-22 LAB — LDL CHOLESTEROL, DIRECT: Direct LDL: 92 mg/dL

## 2019-10-25 ENCOUNTER — Other Ambulatory Visit: Payer: Self-pay | Admitting: Endocrinology

## 2019-11-02 ENCOUNTER — Ambulatory Visit (INDEPENDENT_AMBULATORY_CARE_PROVIDER_SITE_OTHER): Payer: BC Managed Care – PPO | Admitting: Endocrinology

## 2019-11-02 ENCOUNTER — Encounter: Payer: Self-pay | Admitting: Endocrinology

## 2019-11-02 ENCOUNTER — Other Ambulatory Visit: Payer: Self-pay

## 2019-11-02 DIAGNOSIS — E669 Obesity, unspecified: Secondary | ICD-10-CM | POA: Diagnosis not present

## 2019-11-02 DIAGNOSIS — E1169 Type 2 diabetes mellitus with other specified complication: Secondary | ICD-10-CM | POA: Diagnosis not present

## 2019-11-02 DIAGNOSIS — E782 Mixed hyperlipidemia: Secondary | ICD-10-CM

## 2019-11-02 NOTE — Progress Notes (Signed)
Patient ID: Gordon Green, male   DOB: Aug 20, 1961, 58 y.o.   MRN: [id number]          Reason for Appointment: Follow-up for various problems   Today's office visit was provided via telemedicine using video technique Explained to the patient and the the limitations of evaluation and management by telemedicine and the availability of in person appointments.  The patient understood the limitations and agreed to proceed. Patient also understood that the telehealth visit is billable. . Location of the patient: Home . Location of the provider: Office Only the patient and myself were participating in the encounter    History of Present Illness:          Date of diagnosis of type 2 diabetes mellitus: 11/2017        Background history:       He had presented to his urgent care center with symptoms of excessive thirst, urination, fatigue and blurred vision starting in late December following a significant respiratory infection His blood sugar was markedly increased and he was referred for admission, however was treated in the emergency room for his blood sugar of 409 with IV fluids and an injection of insulin.  He did not have any renal function abnormalities or acidosis He was sent home on metformin 1 g twice a day  His baseline A1c at diagnosis was 11.6   Recent history:  Non-insulin hypoglycemic drugs the patient is taking are: Metformin none at breakfast and 1 g dinner   A1c has been consistently normal, last 5.9   Current management, blood sugar patterns and problems identified:  He had been recommended a short-term follow-up for his lipids in July but he is now coming back and no A1c is available as yet  He has had some blood sugar testing done but only sporadic  Highest reading was 160 after eating just toast and coffee with sugar based sweetener, otherwise after meal readings are not being checked much  He has not done any exercise recently, previously was doing some  hiking and weekend  He is still not able to lose weight and has gained 4 pounds  He thinks he is getting more fast food and not making good choices        Side effects from medications have been: Nausea with 1 g metformin in the morning    Glucose monitoring:   Frequency of monitoring    less than once a day    Glucometer:  Contour .      Blood Glucose readings from monitor   By recall: Fasting readings mostly about 120  Previous readings:  PRE-MEAL Fasting Lunch Dinner Bedtime Overall  Glucose range:  119-146   149    Mean/median:     ?   POST-MEAL PC Breakfast PC Lunch PC Dinner  Glucose range:  157   139  Mean/median:       Previous average blood sugar 123     Dietician visit, most recent: 01/2018               Exercise:  He is doing walking or other activities, mostly in the last 2 weeks  Weight history:  Wt Readings from Last 3 Encounters:  12/24/18 254 lb (115.2 kg)  11/10/18 254 lb 6.4 oz (115.4 kg)  10/08/18 250 lb 3.2 oz (113.5 kg)    Glycemic control:   Lab Results  Component Value Date   HGBA1C 5.9 05/29/2019   HGBA1C 5.8 02/04/2019   HGBA1C  5.2 11/06/2018   Lab Results  Component Value Date   MICROALBUR 37.8 (H) 02/04/2019   [place] Comment 11/06/2018   CREATININE 1.35 05/29/2019   Lab Results  Component Value Date   MICRALBCREAT 23.8 02/04/2019    No results found for: FRUCTOSAMINE  LIPID management: Discussed in review of systems   Allergies as of 11/02/2019   No Known Allergies     Medication List       Accurate as of November 02, 2019  1:12 PM. If you have any questions, ask your nurse or doctor.        acetaminophen 500 MG tablet Commonly known as: TYLENOL Take 500 mg by mouth every 6 (six) hours as needed for mild pain, fever or headache.   allopurinol 300 MG tablet Commonly known as: ZYLOPRIM TAKE 1 TABLET BY MOUTH EVERY DAY   blood glucose meter kit and supplies Please check your blood pressure twice a day and  keep a log.   fenofibrate 145 MG tablet Commonly known as: TRICOR TAKE 1 TABLET BY MOUTH EVERY DAY   glucose blood test strip Commonly known as: Contour Next Test Use as instructed to test blood sugars 1 time daily   Contour Next Test test strip Generic drug: glucose blood USE AS INSTRUCTED TO TEST BLOOD SUGARS 2 TIMES DAILY. DX CODE   Lancets Misc 1 each by Does not apply route 2 (two) times daily. To be used with Contour glucose meter. Dx: E11.8   lisinopril 10 MG tablet Commonly known as: ZESTRIL Take 10 mg by mouth daily.   Magnesium 500 MG Tabs Take 500 mg by mouth daily.   metFORMIN 1000 MG tablet Commonly known as: GLUCOPHAGE TAKE 1 TABLET (1,000 MG TOTAL) BY MOUTH 2 (TWO) TIMES DAILY WITH A MEAL. What changed: See the new instructions.   omeprazole 20 MG tablet Commonly known as: PRILOSEC OTC Take 20 mg by mouth daily.   Potassium Gluconate 595 MG Caps Take 595 mg by mouth daily.   Vascepa 1 g capsule Generic drug: icosapent Ethyl TAKE 2 CAPSULES (2 G TOTAL) BY MOUTH 2 (TWO) TIMES DAILY.       Allergies: No Known Allergies  Past Medical History:  Diagnosis Date  . Cancer ([place]) 02/2004   ;Hx: of right kidney cancer; kidney removed in 2005  . Diabetes mellitus without complication ([place])   . GERD (gastroesophageal reflux disease)   . Gout    Hx: of  . Hyperlipidemia   . Hypertension     Past Surgical History:  Procedure Laterality Date  . Fx: [place] fx:  Marland Kitchen INGUINAL HERNIA REPAIR     age 3  . KIDNEY SURGERY  02/2004   Hx; of removal of right kidnety in 2005  . ORIF TIBIA PLATEAU Left 08/25/2013   Procedure: OPEN REDUCTION INTERNAL FIXATION (ORIF) TIBIAL PLATEAU;  Surgeon: Renette Butters, MD;  Location: [facility];  Service: Orthopedics;  Laterality: Left;    Family History  Problem Relation Age of Onset  . Heart disease Father   . Hyperlipidemia Brother   . Colon cancer Neg Hx   . Esophageal cancer Neg Hx   . Prostate cancer Neg  Hx   . Rectal cancer Neg Hx   . Stomach cancer Neg Hx   . Diabetes Neg Hx     Social History:  reports that he quit smoking about 15 years ago. His smoking use included cigarettes. He has never used smokeless tobacco. He reports  current alcohol use of about 4.0 standard drinks of alcohol per week. He reports that he does not use drugs.   Review of Systems   Lipid history:   He had been on fenofibrate along with Vascepa since 11/2018 Previously had baseline triglycerides over 800 LDL has been consistently below 100 without a statin drug  Fasting triglycerides are now about the same as before and over 400 He has taken his Vascepa most of the time and occasionally only forgetting the evening dose However he has not been watching his diet with more fast food and not restricting high-fat intake Also not exercising or losing weight  At this time he does not want to add another medication   Not clear if he may have had higher LFTs related to niacin previously   Lab Results  Component Value Date   CHOL 189 10/22/2019   CHOL 184 05/29/2019   CHOL 166 02/04/2019   Lab Results  Component Value Date   HDL 24.20 (L) 10/22/2019   HDL 27.00 (L) 05/29/2019   HDL 20.70 (L) 02/04/2019   Lab Results  Component Value Date   LDLCALC Comment 11/06/2018   LDLCALC 104 (H) 08/01/2018   [place] Comment 05/09/2018   Lab Results  Component Value Date   TRIG (H) 10/22/2019    428.0 Triglyceride is over 400; calculations on Lipids are invalid.   TRIG (H) 05/29/2019    413.0 Triglyceride is over 400; calculations on Lipids are invalid.   TRIG 353.0 (H) 02/04/2019   Lab Results  Component Value Date   CHOLHDL 8 10/22/2019   CHOLHDL 7 05/29/2019   CHOLHDL 8 02/04/2019   Lab Results  Component Value Date   LDLDIRECT 92.0 10/22/2019   LDLDIRECT 99.0 05/29/2019   LDLDIRECT 99.0 02/04/2019            Hypertension: On treatment since 2005 Was taking lisinopril HCTZ which was stopped by  nephrologist  Also has proteinuria and history of nephrectomy Microalbumin is last normal in 4/20  Blood pressure recently at home 110/79   BP Readings from Last 3 Encounters:  12/24/18 126/72  11/10/18 134/76  10/08/18 133/81   Has borderline renal function  Has not seen PCP recently  Lab Results  Component Value Date   CREATININE 1.35 05/29/2019   CREATININE 1.09 11/06/2018   CREATININE 1.23 10/08/2018    Most recent eye exam: 04/2019  Most recent foot exam: 2/20    LABS:  No visits with results within 1 Week(s) from this visit.  Latest known visit with results is:  Lab on 10/22/2019  Component Date Value Ref Range Status  . Cholesterol 10/22/2019 189  0 - 200 mg/dL Final   ATP III Classification       Desirable:  < 200 mg/dL               Borderline High:  200 - 239 mg/dL          High:  > = 240 mg/dL  . Triglycerides 10/22/2019 428.0 Triglyceride is over 400; calculations on Lipids are invalid.* 0.0 - 149.0 mg/dL Final   Normal:  <150 mg/dLBorderline High:  150 - 199 mg/dL  . HDL 10/22/2019 24.20* >39.00 mg/dL Final  . Total CHOL/HDL Ratio 10/22/2019 8   Final                  Men          Women1/2 Average Risk     3.4  3.3Average Risk          5.0          4.42X Average Risk          9.6          7.13X Average Risk          15.0          11.0                      . Direct LDL 10/22/2019 92.0  mg/dL Final   Optimal:  <100 mg/dLNear or Above Optimal:  100-129 mg/dLBorderline High:  130-159 mg/dLHigh:  160-189 mg/dLVery High:  >190 mg/dL    Physical Examination:  There were no vitals taken for this visit.      ASSESSMENT:  HYPERTRIGLYCERIDEMIA: He has had persistent hypertriglyceridemia This is still not controlled with triglycerides again over 400 Although he has taken Vascepa and fenofibrate regularly his lipids are not improving and this is likely to be from insulin resistance syndrome May benefit from adding a statin drug but also improved diet  with restriction of high-fat meals, more efforts to lose weight  Diabetes type 2 with obesity, on metformin  See history of present illness for discussion of current diabetes management, blood sugar patterns and problems identified  His A1c will be rechecked on the next visit His fasting readings are usually about 120 and these are not any higher recently  He will continue with regimen of metformin 1000 mg a day and follow-up in 3 months    PLAN:   Start improving diet with restricting high-fat foods and better choices with lean meats, more salads and vegetables and small amounts of whole-grain carbohydrates If fasting lipids are still not controlled with high triglyceride he will start a statin drug like Crestor  Renal function to be checked on the next visit Also follow-up with nephrology  There are no Patient Instructions on file for this visit.      Elayne Snare 11/02/2019, 1:12 PM   Note: This office note was prepared with Dragon voice recognition system technology. Any transcriptional errors that result from this process are unintentional.

## 2019-12-03 ENCOUNTER — Other Ambulatory Visit: Payer: Self-pay | Admitting: Endocrinology

## 2019-12-06 ENCOUNTER — Other Ambulatory Visit: Payer: Self-pay | Admitting: Endocrinology

## 2019-12-09 ENCOUNTER — Telehealth: Payer: Self-pay

## 2019-12-09 NOTE — Telephone Encounter (Signed)
Pt has been scheduled for 2 month f/u with labs per Dr. Ronnie Derby request by [facility].

## 2019-12-09 NOTE — Telephone Encounter (Signed)
-----   Message from Elayne Snare, MD sent at 12/06/2019  1:07 PM EST ----- Regarding: Appointment Please set up appointment in about 2 months with labs

## 2019-12-12 ENCOUNTER — Other Ambulatory Visit: Payer: Self-pay | Admitting: Endocrinology

## 2020-01-07 DIAGNOSIS — R809 Proteinuria, unspecified: Secondary | ICD-10-CM | POA: Diagnosis not present

## 2020-01-07 DIAGNOSIS — N182 Chronic kidney disease, stage 2 (mild): Secondary | ICD-10-CM | POA: Diagnosis not present

## 2020-01-07 DIAGNOSIS — I129 Hypertensive chronic kidney disease with stage 1 through stage 4 chronic kidney disease, or unspecified chronic kidney disease: Secondary | ICD-10-CM | POA: Diagnosis not present

## 2020-01-07 DIAGNOSIS — N179 Acute kidney failure, unspecified: Secondary | ICD-10-CM | POA: Diagnosis not present

## 2020-02-04 NOTE — Telephone Encounter (Signed)
No action needed

## 2020-02-09 ENCOUNTER — Other Ambulatory Visit (INDEPENDENT_AMBULATORY_CARE_PROVIDER_SITE_OTHER): Payer: BC Managed Care – PPO

## 2020-02-09 ENCOUNTER — Other Ambulatory Visit: Payer: Self-pay

## 2020-02-09 DIAGNOSIS — E1169 Type 2 diabetes mellitus with other specified complication: Secondary | ICD-10-CM | POA: Diagnosis not present

## 2020-02-09 DIAGNOSIS — E669 Obesity, unspecified: Secondary | ICD-10-CM | POA: Diagnosis not present

## 2020-02-09 DIAGNOSIS — E782 Mixed hyperlipidemia: Secondary | ICD-10-CM

## 2020-02-09 LAB — HEMOGLOBIN A1C: Hgb A1c MFr Bld: 5.7 % (ref 4.6–6.5)

## 2020-02-09 LAB — LIPID PANEL
Cholesterol: 181 mg/dL (ref 0–200)
HDL: 24.7 mg/dL — ABNORMAL LOW (ref >39.00)
NonHDL: 155.92
Total CHOL/HDL Ratio: 7
Triglycerides: 390 mg/dL — ABNORMAL HIGH (ref 0.0–149.0)
VLDL: 78 mg/dL — ABNORMAL HIGH (ref 0.0–40.0)

## 2020-02-09 LAB — COMPREHENSIVE METABOLIC PANEL
ALT: 47 U/L (ref 0–53)
AST: 31 U/L (ref 0–37)
Albumin: 4.5 g/dL (ref 3.5–5.2)
Alkaline Phosphatase: 63 U/L (ref 39–117)
BUN: 28 mg/dL — ABNORMAL HIGH (ref 6–23)
CO2: 29 mEq/L (ref 19–32)
Calcium: 9.8 mg/dL (ref 8.4–10.5)
Chloride: 102 mEq/L (ref 96–112)
Creatinine, Ser: 1.33 mg/dL (ref 0.40–1.50)
GFR: 55.23 mL/min — ABNORMAL LOW (ref >60.00)
Glucose, Bld: 104 mg/dL — ABNORMAL HIGH (ref 70–99)
Potassium: 4.8 mEq/L (ref 3.5–5.1)
Sodium: 137 mEq/L (ref 135–145)
Total Bilirubin: 0.5 mg/dL (ref 0.2–1.2)
Total Protein: 7.3 g/dL (ref 6.0–8.3)

## 2020-02-09 LAB — LDL CHOLESTEROL, DIRECT: Direct LDL: 91 mg/dL

## 2020-02-12 ENCOUNTER — Other Ambulatory Visit: Payer: Self-pay

## 2020-02-12 ENCOUNTER — Telehealth (INDEPENDENT_AMBULATORY_CARE_PROVIDER_SITE_OTHER): Payer: BC Managed Care – PPO | Admitting: Endocrinology

## 2020-02-12 ENCOUNTER — Encounter: Payer: Self-pay | Admitting: Endocrinology

## 2020-02-12 DIAGNOSIS — E1169 Type 2 diabetes mellitus with other specified complication: Secondary | ICD-10-CM

## 2020-02-12 DIAGNOSIS — E669 Obesity, unspecified: Secondary | ICD-10-CM | POA: Diagnosis not present

## 2020-02-12 DIAGNOSIS — E782 Mixed hyperlipidemia: Secondary | ICD-10-CM

## 2020-02-12 MED ORDER — SIMVASTATIN 20 MG PO TABS: 20.0000 mg | ORAL_TABLET | Freq: Every day | ORAL | 3 refills | Status: DC

## 2020-02-12 MED ORDER — CONTOUR NEXT TEST VI STRP: ORAL_STRIP | 3 refills | Status: DC

## 2020-02-12 NOTE — Progress Notes (Signed)
Patient ID: Gordon Green, male   DOB: [birth year], 59 y.o.   MRN: [id number]          Reason for Appointment: Follow-up for various problems  I connected with the above-named patient by video enabled telemedicine application and verified that I am speaking with the correct person. The patient was explained the limitations of evaluation and management by telemedicine and the availability of in person appointments.  Patient also understood that there may be a patient responsible charge related to this service . Location of the patient: Patient's home . Location of the provider: Physician office Only the patient and myself were participating in the encounter The patient understood the above statements and agreed to proceed.  History of Present Illness:          Date of diagnosis of type 2 diabetes mellitus: 11/2017        Background history:       He had presented to his urgent care center with symptoms of excessive thirst, urination, fatigue and blurred vision starting in late December following a significant respiratory infection His blood sugar was markedly increased and he was referred for admission, however was treated in the emergency room for his blood sugar of 409 with IV fluids and an injection of insulin.  He did not have any renal function abnormalities or acidosis He was sent home on metformin 1 g twice a day  His baseline A1c at diagnosis was 11.6   Recent history:  Non-insulin hypoglycemic drugs the patient is taking are: Metformin 500 at breakfast and 1 g dinner   A1c has been consistently normal, last 5.9 and now 5.7  Current management, blood sugar patterns and problems identified:  He had noticed his morning sugars to be relatively higher at home and he started taking another 500 mg of Metformin in the morning  However still has sometimes higher readings in the mornings up to 174  His blood sugars appear to be relatively higher earlier in the morning and lab  fasting glucose done around 10 AM was only 104  Has only rarely blood sugars in the evenings which are not high  He has done some intermittent exercise and recently starting to do some walking or hiking  No side effects with Metformin  He thinks his weight is recently starting to come down slightly, has not had a weight check in the office since 12/2018  Although he had been previously eating out more at fast food he is trying to cut back on this but may not always plan his meals consistently        Side effects from medications have been: Nausea with 1 g metformin in the morning    Glucose monitoring:   Frequency of monitoring    less than once a day    Glucometer:  Contour .      Blood Glucose readings from monitor   Recent blood sugar range 104-178 Most blood sugars in the mornings with average about 142, evening blood sugar average about 120 Overall AVERAGE 134  Previous readings:  PRE-MEAL Fasting Lunch Dinner Bedtime Overall  Glucose range:  119-146   149    Mean/median:     ?   POST-MEAL PC Breakfast PC Lunch PC Dinner  Glucose range:  157   139  Mean/median:           Dietician visit, most recent: 01/2018               Exercise:  He is doing walking or other activities, mostly in the last 2 weeks  Weight history:  Wt Readings from Last 3 Encounters:  12/24/18 254 lb (115.2 kg)  11/10/18 254 lb 6.4 oz (115.4 kg)  10/08/18 250 lb 3.2 oz (113.5 kg)    Glycemic control:   Lab Results  Component Value Date   HGBA1C 5.7 02/09/2020   HGBA1C 5.9 05/29/2019   HGBA1C 5.8 02/04/2019   Lab Results  Component Value Date   MICROALBUR 37.8 (H) 02/04/2019   [place] Comment 11/06/2018   CREATININE 1.33 02/09/2020   Lab Results  Component Value Date   MICRALBCREAT 23.8 02/04/2019    No results found for: FRUCTOSAMINE  LIPID management: Discussed in review of systems   Allergies as of 02/12/2020   No Known Allergies     Medication List       Accurate as  of February 12, 2020  8:37 AM. If you have any questions, ask your nurse or doctor.        acetaminophen 500 MG tablet Commonly known as: TYLENOL Take 500 mg by mouth every 6 (six) hours as needed for mild pain, fever or headache.   allopurinol 300 MG tablet Commonly known as: ZYLOPRIM TAKE 1 TABLET BY MOUTH EVERY DAY   blood glucose meter kit and supplies Please check your blood pressure twice a day and keep a log.   fenofibrate 145 MG tablet Commonly known as: TRICOR TAKE 1 TABLET BY MOUTH EVERY DAY   glucose blood test strip Commonly known as: Contour Next Test Use as instructed to test blood sugars 1 time daily   Contour Next Test test strip Generic drug: glucose blood USE AS INSTRUCTED TO TEST BLOOD SUGARS 2 TIMES DAILY. DX CODE   Lancets Misc 1 each by Does not apply route 2 (two) times daily. To be used with Contour glucose meter. Dx: E11.8   lisinopril 10 MG tablet Commonly known as: ZESTRIL Take 10 mg by mouth daily.   Magnesium 500 MG Tabs Take 500 mg by mouth daily.   metFORMIN 1000 MG tablet Commonly known as: GLUCOPHAGE Take 1,000 mg by mouth 2 (two) times daily with a meal. Take 1/2 tablet (513m total) by mouth in the morning and 1 tablet (10019mtotal) in the evening. What changed: Another medication with the same name was removed. Continue taking this medication, and follow the directions you see here. Changed by: AjElayne SnareMD   omeprazole 20 MG tablet Commonly known as: PRILOSEC OTC Take 20 mg by mouth daily.   Potassium Gluconate 595 MG Caps Take 595 mg by mouth daily.   Vascepa 1 g capsule Generic drug: icosapent Ethyl TAKE 2 CAPSULES (2 G TOTAL) BY MOUTH 2 (TWO) TIMES DAILY.       Allergies: No Known Allergies  Past Medical History:  Diagnosis Date  . [place]   ;Hx: of right kidney cancer; kidney removed in 2005  . [place] mellitus without complication ([facility]  . GERD (gastroesophageal reflux disease)   . Gout    Hx: of   . Hyperlipidemia   . Hypertension     Past Surgical History:  Procedure Laterality Date  . [place]: [place]:  . Marland KitchenNGUINAL HERNIA REPAIR     age 75 6. KIDNEY SURGERY  02/2004   Hx; of removal of right kidnety in 2005  . ORIF TIBIA PLATEAU Left 08/25/2013   Procedure: OPEN REDUCTION INTERNAL FIXATION (ORIF) TIBIAL PLATEAU;  Surgeon: Renette Butters, MD;  Location: [facility];  Service: Orthopedics;  Laterality: Left;    Family History  Problem Relation Age of Onset  . Heart disease Father   . Hyperlipidemia Brother   . Colon cancer Neg Hx   . Esophageal cancer Neg Hx   . Prostate cancer Neg Hx   . Rectal cancer Neg Hx   . Stomach cancer Neg Hx   . Diabetes Neg Hx     Social History:  reports that he quit smoking about 15 years ago. His smoking use included cigarettes. He has never used smokeless tobacco. He reports current alcohol use of about 4.0 standard drinks of alcohol per week. He reports that he does not use drugs.   Review of Systems   Lipid history:   He had been on fenofibrate along with Vascepa since 11/2018 Previously had baseline triglycerides over 800 LDL has been consistently below 100 without a statin drug  Fasting triglycerides are again about the same as before at 390 Diet has been somewhat better but again not optimal He has only recently started exercising Has not lost any significant amount of weight  Previously he had improved triglycerides when he had done his diet plan diligently along with weight loss  Not clear if he may have had higher LFTs related to niacin previously   Lab Results  Component Value Date   CHOL 181 02/09/2020   CHOL 189 10/22/2019   CHOL 184 05/29/2019   Lab Results  Component Value Date   HDL 24.70 (L) 02/09/2020   HDL 24.20 (L) 10/22/2019   HDL 27.00 (L) 05/29/2019   Lab Results  Component Value Date   LDLCALC Comment 11/06/2018   LDLCALC 104 (H) 08/01/2018   [place] Comment 05/09/2018   Lab Results    Component Value Date   TRIG 390.0 (H) 02/09/2020   TRIG (H) 10/22/2019    428.0 Triglyceride is over 400; calculations on Lipids are invalid.   TRIG (H) 05/29/2019    413.0 Triglyceride is over 400; calculations on Lipids are invalid.   Lab Results  Component Value Date   CHOLHDL 7 02/09/2020   CHOLHDL 8 10/22/2019   CHOLHDL 7 05/29/2019   Lab Results  Component Value Date   LDLDIRECT 91.0 02/09/2020   LDLDIRECT 92.0 10/22/2019   LDLDIRECT 99.0 05/29/2019            Hypertension: On treatment since 2005  Now followed by nephrologist also has proteinuria and history of nephrectomy Microalbumin is last normal in 4/20  Blood pressure recently at home 133/84 Not on HCTZ and only on 10 mg lisinopril and he is going to discuss with nephrologist   BP Readings from Last 3 Encounters:  12/24/18 126/72  11/10/18 134/76  10/08/18 133/81   Has borderline renal function as before  Lab Results  Component Value Date   CREATININE 1.33 02/09/2020   CREATININE 1.35 05/29/2019   CREATININE 1.09 11/06/2018    Most recent eye exam: 04/2019  Most recent foot exam: 2/20    LABS:  Lab on 02/09/2020  Component Date Value Ref Range Status  . Cholesterol 02/09/2020 181  0 - 200 mg/dL Final   ATP III Classification       Desirable:  < 200 mg/dL               Borderline High:  200 - 239 mg/dL          High:  > = 240 mg/dL  .  Triglycerides 02/09/2020 390.0* 0.0 - 149.0 mg/dL Final   Normal:  <150 mg/dLBorderline High:  150 - 199 mg/dL  . HDL 02/09/2020 24.70* >39.00 mg/dL Final  . VLDL 02/09/2020 78.0* 0.0 - 40.0 mg/dL Final  . Total CHOL/HDL Ratio 02/09/2020 7   Final                  Men          Women1/2 Average Risk     3.4          3.3Average Risk          5.0          4.42X Average Risk          9.6          7.13X Average Risk          15.0          11.0                      . NonHDL 02/09/2020 155.92   Final   NOTE:  Non-HDL goal should be 30 mg/dL higher than patient's LDL  goal (i.e. LDL goal of < 70 mg/dL, would have non-HDL goal of < 100 mg/dL)  . Sodium 02/09/2020 137  135 - 145 mEq/L Final  . Potassium 02/09/2020 4.8  3.5 - 5.1 mEq/L Final  . Chloride 02/09/2020 102  96 - 112 mEq/L Final  . CO2 02/09/2020 29  19 - 32 mEq/L Final  . Glucose, Bld 02/09/2020 104* 70 - 99 mg/dL Final  . BUN 02/09/2020 28* 6 - 23 mg/dL Final  . Creatinine, Ser 02/09/2020 1.33  0.40 - 1.50 mg/dL Final  . Total Bilirubin 02/09/2020 0.5  0.2 - 1.2 mg/dL Final  . Alkaline Phosphatase 02/09/2020 63  39 - 117 U/L Final  . AST 02/09/2020 31  0 - 37 U/L Final  . ALT 02/09/2020 47  0 - 53 U/L Final  . Total Protein 02/09/2020 7.3  6.0 - 8.3 g/dL Final  . Albumin 02/09/2020 4.5  3.5 - 5.2 g/dL Final  . GFR 02/09/2020 55.23* >60.00 mL/min Final  . Calcium 02/09/2020 9.8  8.4 - 10.5 mg/dL Final  . Hgb A1c MFr Bld 02/09/2020 5.7  4.6 - 6.5 % Final   Glycemic Control Guidelines for People with Diabetes:Non Diabetic:  <6%Goal of Therapy: <7%Additional Action Suggested:  >8%   . Direct LDL 02/09/2020 91.0  mg/dL Final   Optimal:  <100 mg/dLNear or Above Optimal:  100-129 mg/dLBorderline High:  130-159 mg/dLHigh:  160-189 mg/dLVery High:  >190 mg/dL    Physical Examination:  There were no vitals taken for this visit.      ASSESSMENT:  HYPERTRIGLYCERIDEMIA: He has had persistent hypertriglyceridemia This is still not controlled with triglycerides again around 400 Still not responding despite taking Vascepa and fenofibrate as directed May benefit from adding a statin drug but also improved diet with restriction of high-fat meals and weight loss  Diabetes type 2 with obesity, on metformin  See history of present illness for discussion of current diabetes management, blood sugar patterns and problems identified  His A1c is excellent at 5.9 on Metformin alone With his insulin resistance he tends to have higher fasting readings earlier in the morning but usually in the evening blood  sugars are better although checking primarily fasting readings He is just starting to get back into exercise regimen and will be also needing to focus on his meal  plan   PLAN:   Consistent meal planning with restricting high-fat foods and better choices with lean meats, more salads and vegetables and small amounts of whole-grain carbohydrates Simvastatin 20 mg daily  No change in metformin  Also follow-up with nephrology  There are no Patient Instructions on file for this visit.      Elayne Snare 02/12/2020, 8:37 AM   Note: This office note was prepared with Dragon voice recognition system technology. Any transcriptional errors that result from this process are unintentional.

## 2020-03-10 ENCOUNTER — Other Ambulatory Visit: Payer: Self-pay | Admitting: Endocrinology

## 2020-03-15 ENCOUNTER — Other Ambulatory Visit: Payer: Self-pay | Admitting: Endocrinology

## 2020-05-19 ENCOUNTER — Other Ambulatory Visit: Payer: Self-pay | Admitting: Endocrinology

## 2020-05-24 ENCOUNTER — Telehealth: Payer: Self-pay | Admitting: *Deleted

## 2020-05-24 NOTE — Telephone Encounter (Signed)
He needs to find out from insurance what else is covered.  Also is overdue for his follow-up visit with fasting labs, please schedule

## 2020-05-24 NOTE — Telephone Encounter (Signed)
Received rejection message from [facility] regarding prescription for Fenofibrate 145 mg tablet to be taken daily. Would you like to do a prior authorization or prescribe something else?

## 2020-05-24 NOTE — Telephone Encounter (Signed)
Will advise patient he needs to contact his insurance company to find out which med they will cover in lieu of Fenofibrate 145 mg.

## 2020-05-24 NOTE — Telephone Encounter (Signed)
Left message for patient to call re: follow-up appointment.

## 2020-06-01 ENCOUNTER — Telehealth: Payer: Self-pay | Admitting: Endocrinology

## 2020-06-01 ENCOUNTER — Other Ambulatory Visit: Payer: Self-pay

## 2020-06-01 MED ORDER — FENOFIBRATE MICRONIZED 134 MG PO CAPS: 134.0000 mg | ORAL_CAPSULE | Freq: Every day | ORAL | 2 refills | Status: DC

## 2020-06-01 NOTE — Telephone Encounter (Signed)
Noted. New Rx has been sent.

## 2020-06-01 NOTE — Telephone Encounter (Signed)
Okay to change? 

## 2020-06-01 NOTE — Telephone Encounter (Signed)
Patient called stating due to insurance purposes we will need to send a new Rx in for the fenofibrate but instead of 145 mg tablet it needs to be 134 mg capsules.  Pharmacy -  [id number] - [place], [address]  [place], [place] [address]  Phone:  [phone] Fax:  [phone]

## 2020-06-22 ENCOUNTER — Other Ambulatory Visit: Payer: BC Managed Care – PPO

## 2020-06-23 ENCOUNTER — Other Ambulatory Visit: Payer: Self-pay

## 2020-06-23 ENCOUNTER — Other Ambulatory Visit (INDEPENDENT_AMBULATORY_CARE_PROVIDER_SITE_OTHER): Payer: BC Managed Care – PPO

## 2020-06-23 DIAGNOSIS — E1169 Type 2 diabetes mellitus with other specified complication: Secondary | ICD-10-CM | POA: Diagnosis not present

## 2020-06-23 DIAGNOSIS — E782 Mixed hyperlipidemia: Secondary | ICD-10-CM

## 2020-06-23 DIAGNOSIS — E669 Obesity, unspecified: Secondary | ICD-10-CM

## 2020-06-23 LAB — COMPREHENSIVE METABOLIC PANEL
ALT: 42 U/L (ref 0–53)
AST: 32 U/L (ref 0–37)
Albumin: 4.5 g/dL (ref 3.5–5.2)
Alkaline Phosphatase: 49 U/L (ref 39–117)
BUN: 26 mg/dL — ABNORMAL HIGH (ref 6–23)
CO2: 29 mEq/L (ref 19–32)
Calcium: 9.8 mg/dL (ref 8.4–10.5)
Chloride: 100 mEq/L (ref 96–112)
Creatinine, Ser: 1.43 mg/dL (ref 0.40–1.50)
GFR: 50.73 mL/min — ABNORMAL LOW (ref >60.00)
Glucose, Bld: 110 mg/dL — ABNORMAL HIGH (ref 70–99)
Potassium: 4.9 mEq/L (ref 3.5–5.1)
Sodium: 137 mEq/L (ref 135–145)
Total Bilirubin: 0.6 mg/dL (ref 0.2–1.2)
Total Protein: 7.6 g/dL (ref 6.0–8.3)

## 2020-06-23 LAB — MICROALBUMIN / CREATININE URINE RATIO
Creatinine,U: 127.1 mg/dL
Microalb Creat Ratio: 59.1 mg/g — ABNORMAL HIGH (ref 0.0–30.0)
Microalb, Ur: 75.1 mg/dL — ABNORMAL HIGH (ref 0.0–1.9)

## 2020-06-23 LAB — LIPID PANEL
Cholesterol: 129 mg/dL (ref 0–200)
HDL: 22.9 mg/dL — ABNORMAL LOW (ref >39.00)
NonHDL: 105.7
Total CHOL/HDL Ratio: 6
Triglycerides: 396 mg/dL — ABNORMAL HIGH (ref 0.0–149.0)
VLDL: 79.2 mg/dL — ABNORMAL HIGH (ref 0.0–40.0)

## 2020-06-23 LAB — LDL CHOLESTEROL, DIRECT: Direct LDL: 53 mg/dL

## 2020-06-23 LAB — HEMOGLOBIN A1C: Hgb A1c MFr Bld: 5.9 % (ref 4.6–6.5)

## 2020-06-29 ENCOUNTER — Encounter: Payer: Self-pay | Admitting: Endocrinology

## 2020-06-29 ENCOUNTER — Telehealth (INDEPENDENT_AMBULATORY_CARE_PROVIDER_SITE_OTHER): Payer: BC Managed Care – PPO | Admitting: Endocrinology

## 2020-06-29 ENCOUNTER — Other Ambulatory Visit: Payer: Self-pay

## 2020-06-29 VITALS — Wt 253.0 lb

## 2020-06-29 DIAGNOSIS — E669 Obesity, unspecified: Secondary | ICD-10-CM | POA: Diagnosis not present

## 2020-06-29 DIAGNOSIS — E1169 Type 2 diabetes mellitus with other specified complication: Secondary | ICD-10-CM

## 2020-06-29 DIAGNOSIS — R809 Proteinuria, unspecified: Secondary | ICD-10-CM

## 2020-06-29 DIAGNOSIS — E782 Mixed hyperlipidemia: Secondary | ICD-10-CM | POA: Diagnosis not present

## 2020-06-29 MED ORDER — NIACIN ER (ANTIHYPERLIPIDEMIC) 500 MG PO TBCR: 500.0000 mg | EXTENDED_RELEASE_TABLET | Freq: Every day | ORAL | 1 refills | Status: DC

## 2020-06-29 NOTE — Progress Notes (Signed)
Patient ID: Gordon Green, male   DOB: [birth year], 59 y.o.   MRN: [id number]          Reason for Appointment: Follow-up for various problems  I connected with the above-named patient by video enabled telemedicine application and verified that I am speaking with the correct person. The patient was explained the limitations of evaluation and management by telemedicine and the availability of in person appointments.  Patient also understood that there may be a patient responsible charge related to this service . Location of the patient: Patient's home . Location of the provider: Physician office Only the patient and myself were participating in the encounter The patient understood the above statements and agreed to proceed.  History of Present Illness:          Date of diagnosis of type 2 diabetes mellitus: 11/2017        Background history:       He had presented to his urgent care center with symptoms of excessive thirst, urination, fatigue and blurred vision starting in late December following a significant respiratory infection His blood sugar was markedly increased and he was referred for admission, however was treated in the emergency room for his blood sugar of 409 with IV fluids and an injection of insulin.  He did not have any renal function abnormalities or acidosis He was sent home on metformin 1 g twice a day  His baseline A1c at diagnosis was 11.6   Recent history:  Non-insulin hypoglycemic drugs the patient is taking are: Metformin 500 at breakfast and 1 g dinner   A1c has been consistently normal, last 5.9 and now 5.7  Current management, blood sugar patterns and problems identified:  He had         Side effects from medications have been: Nausea with 1 g metformin in the morning    Glucose monitoring:   Frequency of monitoring    less than once a day    Glucometer:  Contour .      Blood Glucose readings from monitor   Recent blood sugar range 104-178 Most  blood sugars in the mornings with average about 142, evening blood sugar average about 120 Overall AVERAGE 134  Previous readings:  PRE-MEAL Fasting Lunch Dinner Bedtime Overall  Glucose range:  119-146   149    Mean/median:     ?   POST-MEAL PC Breakfast PC Lunch PC Dinner  Glucose range:  157   139  Mean/median:           Dietician visit, most recent: 01/2018               Exercise:  He is doing walking or other activities, mostly in the last 2 weeks  Weight history:  Wt Readings from Last 3 Encounters:  12/24/18 254 lb (115.2 kg)  11/10/18 254 lb 6.4 oz (115.4 kg)  10/08/18 250 lb 3.2 oz (113.5 kg)    Glycemic control:   Lab Results  Component Value Date   HGBA1C 5.9 06/23/2020   HGBA1C 5.7 02/09/2020   HGBA1C 5.9 05/29/2019   Lab Results  Component Value Date   MICROALBUR 75.1 (H) 06/23/2020   [place] Comment 11/06/2018   CREATININE 1.43 06/23/2020   Lab Results  Component Value Date   MICRALBCREAT 59.1 (H) 06/23/2020    No results found for: FRUCTOSAMINE  LIPID management: Discussed in review of systems   Allergies as of 06/29/2020   No Known Allergies     Medication  List       Accurate as of June 29, 2020  9:39 AM. If you have any questions, ask your nurse or doctor.        acetaminophen 500 MG tablet Commonly known as: TYLENOL Take 500 mg by mouth every 6 (six) hours as needed for mild pain, fever or headache.   allopurinol 300 MG tablet Commonly known as: ZYLOPRIM TAKE 1 TABLET BY MOUTH EVERY DAY   blood glucose meter kit and supplies Please check your blood pressure twice a day and keep a log.   Contour Next Test test strip Generic drug: glucose blood USE AS INSTRUCTED TO TEST BLOOD SUGARS 2 TIMES DAILY. DX CODE   Contour Next Test test strip Generic drug: glucose blood Use as instructed to test blood sugars 1 time daily   fenofibrate micronized 134 MG capsule Commonly known as: LOFIBRA Take 1 capsule (134 mg total) by mouth  daily.   Lancets Misc 1 each by Does not apply route 2 (two) times daily. To be used with Contour glucose meter. Dx: E11.8   lisinopril 10 MG tablet Commonly known as: ZESTRIL Take 10 mg by mouth daily.   Magnesium 500 MG Tabs Take 500 mg by mouth daily.   metFORMIN 1000 MG tablet Commonly known as: GLUCOPHAGE Take 1,000 mg by mouth 2 (two) times daily with a meal. Take 1/2 tablet (566m total) by mouth in the morning and 1 tablet (10013mtotal) in the evening.   omeprazole 20 MG tablet Commonly known as: PRILOSEC OTC Take 20 mg by mouth daily.   Potassium Gluconate 595 MG Caps Take 595 mg by mouth daily.   simvastatin 20 MG tablet Commonly known as: ZOCOR Take 1 tablet (20 mg total) by mouth at bedtime.   Vascepa 1 g capsule Generic drug: icosapent Ethyl TAKE 2 CAPSULES (2 G TOTAL) BY MOUTH 2 (TWO) TIMES DAILY.       Allergies: No Known Allergies  Past Medical History:  Diagnosis Date  . [place]   ;Hx: of right kidney cancer; kidney removed in 2005  . Diabetes mellitus without complication ([facility]  . GERD (gastroesophageal reflux disease)   . Gout    Hx: of  . Hyperlipidemia   . Hypertension     Past Surgical History:  Procedure Laterality Date  . Fx: tiAgesx:  . Marland KitchenNGUINAL HERNIA REPAIR     age [age]. KIDNEY SURGERY  02/2004   Hx; of removal of right kidnety in 2005  . ORIF TIBIA PLATEAU Left 08/25/2013   Procedure: OPEN REDUCTION INTERNAL FIXATION (ORIF) TIBIAL PLATEAU;  Surgeon: TiRenette ButtersMD;  [facility];  Laterality: Left;    Family History  Problem Relation Age of Onset  . Heart disease Father   . Hyperlipidemia Brother   . Colon cancer Neg Hx   . Esophageal cancer Neg Hx   . Prostate cancer Neg Hx   . Rectal cancer Neg Hx   . Stomach cancer Neg Hx   . Diabetes Neg Hx     Social History:  reports that he quit smoking about 16 years ago. His smoking use included cigarettes. He has  never used smokeless tobacco. He reports current alcohol use of about 4.0 standard drinks of alcohol per week. He reports that he does not use drugs.   Review of Systems   Lipid history:   He had been on fenofibrate along with Vascepa since  11/2018 Previously had baseline triglycerides over 800 LDL has been consistently below 100 without a statin drug  Fasting triglycerides are again about the same as before at 390 Diet has been somewhat better but again not optimal He has only recently started exercising Has not lost any significant amount of weight  Previously he had improved triglycerides when he had done his diet plan diligently along with weight loss  Not clear if he may have had higher LFTs related to niacin previously   Lab Results  Component Value Date   CHOL 129 06/23/2020   CHOL 181 02/09/2020   CHOL 189 10/22/2019   Lab Results  Component Value Date   HDL 22.90 (L) 06/23/2020   HDL 24.70 (L) 02/09/2020   HDL 24.20 (L) 10/22/2019   Lab Results  Component Value Date   LDLCALC Comment 11/06/2018   LDLCALC 104 (H) 08/01/2018   [place] Comment 05/09/2018   Lab Results  Component Value Date   TRIG 396.0 (H) 06/23/2020   TRIG 390.0 (H) 02/09/2020   TRIG (H) 10/22/2019    428.0 Triglyceride is over 400; calculations on Lipids are invalid.   Lab Results  Component Value Date   CHOLHDL 6 06/23/2020   CHOLHDL 7 02/09/2020   CHOLHDL 8 10/22/2019   Lab Results  Component Value Date   LDLDIRECT 53.0 06/23/2020   LDLDIRECT 91.0 02/09/2020   LDLDIRECT 92.0 10/22/2019        Lab Results  Component Value Date   ALT 42 06/23/2020        Hypertension: On treatment since 2005  Now followed by nephrologist also has proteinuria and history of nephrectomy Microalbumin is last normal in 4/20  Blood pressure recently at home   133/84 Not on HCTZ and only on 10 mg lisinopril  BP Readings from Last 3 Encounters:  12/24/18 126/72  11/10/18 134/76  10/08/18  133/81   Has borderline renal function as before  Lab Results  Component Value Date   CREATININE 1.43 06/23/2020   CREATININE 1.33 02/09/2020   CREATININE 1.35 05/29/2019    Most recent eye exam: 04/2019  Most recent foot exam: 2/20    LABS:  Lab on 06/23/2020  Component Date Value Ref Range Status  . Microalb, Ur 06/23/2020 75.1* 0.0 - 1.9 mg/dL Final  . Creatinine,U 06/23/2020 127.1  mg/dL Final  . Microalb Creat Ratio 06/23/2020 59.1* 0.0 - 30.0 mg/g Final  . Cholesterol 06/23/2020 129  0 - 200 mg/dL Final   ATP III Classification       Desirable:  < 200 mg/dL               Borderline High:  200 - 239 mg/dL          High:  > = 240 mg/dL  . Triglycerides 06/23/2020 396.0* 0 - 149 mg/dL Final   Normal:  <150 mg/dLBorderline High:  150 - 199 mg/dL  . HDL 06/23/2020 22.90* >39.00 mg/dL Final  . VLDL 06/23/2020 79.2* 0.0 - 40.0 mg/dL Final  . Total CHOL/HDL Ratio 06/23/2020 6   Final                  Men          Women1/2 Average Risk     3.4          3.3Average Risk          5.0          4.42X Average Risk  9.6          7.13X Average Risk          15.0          11.0                      . NonHDL 06/23/2020 105.70   Final   NOTE:  Non-HDL goal should be 30 mg/dL higher than patient's LDL goal (i.e. LDL goal of < 70 mg/dL, would have non-HDL goal of < 100 mg/dL)  . Sodium 06/23/2020 137  135 - 145 mEq/L Final  . Potassium 06/23/2020 4.9  3.5 - 5.1 mEq/L Final  . Chloride 06/23/2020 100  96 - 112 mEq/L Final  . CO2 06/23/2020 29  19 - 32 mEq/L Final  . Glucose, Bld 06/23/2020 110* 70 - 99 mg/dL Final  . BUN 06/23/2020 26* 6 - 23 mg/dL Final  . Creatinine, Ser 06/23/2020 1.43  0.40 - 1.50 mg/dL Final  . Total Bilirubin 06/23/2020 0.6  0.2 - 1.2 mg/dL Final  . Alkaline Phosphatase 06/23/2020 49  39 - 117 U/L Final  . AST 06/23/2020 32  0 - 37 U/L Final  . ALT 06/23/2020 42  0 - 53 U/L Final  . Total Protein 06/23/2020 7.6  6.0 - 8.3 g/dL Final  . Albumin 06/23/2020 4.5   3.5 - 5.2 g/dL Final  . GFR 06/23/2020 50.73* >60.00 mL/min Final  . Calcium 06/23/2020 9.8  8.4 - 10.5 mg/dL Final  . Hgb A1c MFr Bld 06/23/2020 5.9  4.6 - 6.5 % Final   Glycemic Control Guidelines for People with Diabetes:Non Diabetic:  <6%Goal of Therapy: <7%Additional Action Suggested:  >8%   . Direct LDL 06/23/2020 53.0  mg/dL Final   Optimal:  <100 mg/dLNear or Above Optimal:  100-129 mg/dLBorderline High:  130-159 mg/dLHigh:  160-189 mg/dLVery High:  >190 mg/dL    Physical Examination:  There were no vitals taken for this visit.      ASSESSMENT:  HYPERTRIGLYCERIDEMIA: He has had persistent hypertriglyceridemia This is still not controlled with triglycerides again around 400 He is taking simvastatin in addition to Vascepa and fenofibrate Previously had taken niacin with only an OTC no flush preparation and likely this was neither helping nor causing flushing, however likely that this is also affecting liver function   Diabetes type 2 with obesity, on metformin  See history of present illness for discussion of current diabetes management, blood sugar patterns and problems identified  His A1c is excellent at 5.9 on Metformin alone Again weight loss has not been achieved He thinks he can start back on more exercise Also needs consistent meal planning with avoiding higher calorie and high-fat foods Potentially may be a candidate for a GLP-1 drug for weight loss  Microalbuminuria, increased from a year ago  PLAN:   Start working on weight loss more consistently  Trial of Niaspan 500 mg at bedtime for 2 weeks and then 1000 mg with small snack If he has a difficulty with flushing he can take 2 tablets of coated 81 mg aspirin prior to this No change in metformin  Forwarded results of microalbumin to nephrologist for review, may need higher dose of lisinopril  There are no Patient Instructions on file for this visit.      Elayne Snare 06/29/2020, 9:39 AM   Note: This  office note was prepared with Dragon voice recognition system technology. Any transcriptional errors that result from this process are unintentional.

## 2020-06-30 ENCOUNTER — Other Ambulatory Visit: Payer: Self-pay | Admitting: Endocrinology

## 2020-07-20 DIAGNOSIS — Z23 Encounter for immunization: Secondary | ICD-10-CM | POA: Diagnosis not present

## 2020-07-20 DIAGNOSIS — Z7189 Other specified counseling: Secondary | ICD-10-CM | POA: Diagnosis not present

## 2020-07-20 DIAGNOSIS — I129 Hypertensive chronic kidney disease with stage 1 through stage 4 chronic kidney disease, or unspecified chronic kidney disease: Secondary | ICD-10-CM | POA: Diagnosis not present

## 2020-07-20 DIAGNOSIS — N179 Acute kidney failure, unspecified: Secondary | ICD-10-CM | POA: Diagnosis not present

## 2020-07-20 DIAGNOSIS — N182 Chronic kidney disease, stage 2 (mild): Secondary | ICD-10-CM | POA: Diagnosis not present

## 2020-07-20 DIAGNOSIS — E1122 Type 2 diabetes mellitus with diabetic chronic kidney disease: Secondary | ICD-10-CM | POA: Diagnosis not present

## 2020-07-24 ENCOUNTER — Other Ambulatory Visit: Payer: Self-pay | Admitting: Family Medicine

## 2020-07-24 NOTE — Telephone Encounter (Signed)
Requested medication (s) are due for refill today: yes   Requested medication (s) are on the active medication list: yes   Last refill:  04/19/2020  Future visit scheduled: no  Notes to clinic:  overdue for follow appointment    Requested Prescriptions  Pending Prescriptions Disp Refills   metFORMIN (GLUCOPHAGE) 1000 MG tablet [Pharmacy Med Name: METFORMIN HCL 1,000 MG TABLET] 180 tablet 1    Sig: TAKE 1 TABLET (1,000 MG TOTAL) BY MOUTH 2 (TWO) TIMES DAILY WITH A MEAL.      Endocrinology:  Diabetes - Biguanides Failed - 07/24/2020  9:09 AM      Failed - eGFR in normal range and within 360 days    GFR calc Af Amer  Date Value Ref Range Status  11/06/2018 87 >59 mL/min/1.73 Final   GFR calc non Af Amer  Date Value Ref Range Status  11/06/2018 75 >59 mL/min/1.73 Final   GFR  Date Value Ref Range Status  06/23/2020 50.73 (L) >60.00 mL/min Final          Failed - Valid encounter within last 6 months    Recent Outpatient Visits           1 year ago History of gout   [facility], Lilia Argue, MD   1 year ago History of gout   [facility], Lilia Argue, MD   1 year ago Type 2 diabetes mellitus without complication, without long-term current use of insulin [facility])   [facility], Lilia Argue, MD   2 years ago Type 2 diabetes mellitus without complication, without long-term current use of insulin [facility])   [facility], Audrie Lia, PA-C   2 years ago Sore throat   [facility] at [facility], Audrie Lia, PA-C              Passed - Cr in normal range and within 360 days    Creat  Date Value Ref Range Status  04/27/2015 1.47 (H) 0.50 - 1.35 mg/dL Final   Creatinine, Ser  Date Value Ref Range Status  06/23/2020 1.43 0.40 - 1.50 mg/dL Final   Creatinine,U  Date Value Ref Range Status  06/23/2020 127.1 mg/dL Final          Passed - HBA1C is between 0 and 7.9 and within 180 days    Hgb A1c MFr Bld  Date Value  Ref Range Status  06/23/2020 5.9 4.6 - 6.5 % Final    Comment:    Glycemic Control Guidelines for People with Diabetes:Non Diabetic:  <6%Goal of Therapy: <7%Additional Action Suggested:  >8%

## 2020-08-09 DIAGNOSIS — N182 Chronic kidney disease, stage 2 (mild): Secondary | ICD-10-CM | POA: Diagnosis not present

## 2020-08-09 DIAGNOSIS — I129 Hypertensive chronic kidney disease with stage 1 through stage 4 chronic kidney disease, or unspecified chronic kidney disease: Secondary | ICD-10-CM | POA: Diagnosis not present

## 2020-08-16 ENCOUNTER — Telehealth: Payer: Self-pay | Admitting: Endocrinology

## 2020-08-16 ENCOUNTER — Other Ambulatory Visit: Payer: Self-pay | Admitting: Endocrinology

## 2020-08-16 NOTE — Telephone Encounter (Signed)
We have received paperwork and it has been giving Gordon Green. She will find out what needs to be done and complete what every is needed

## 2020-08-16 NOTE — Telephone Encounter (Signed)
Medication Refill Request  Did you call your pharmacy and request this refill first? Yes   If patient has not contacted pharmacy first, instruct them to do so for future refills.   Remind them that contacting the pharmacy for their refill is the quickest method to get the refill.   Refill policy also stated that it will take anywhere between 24-72 hours to receive the refill.    Name of medication? vascepa and niacin (vascepa may need a PA - forms for what we got for that are labeled and in Kumar's box)  Is this a 90 day supply? yes  Name and location of pharmacy?  [id number] - [place], [place] Phone:  [phone]  Fax:  [phone]

## 2020-08-25 ENCOUNTER — Other Ambulatory Visit: Payer: Self-pay | Admitting: Endocrinology

## 2020-08-29 ENCOUNTER — Other Ambulatory Visit: Payer: Self-pay | Admitting: Endocrinology

## 2020-10-06 ENCOUNTER — Other Ambulatory Visit: Payer: Self-pay | Admitting: Endocrinology

## 2020-10-06 ENCOUNTER — Ambulatory Visit (INDEPENDENT_AMBULATORY_CARE_PROVIDER_SITE_OTHER): Payer: BC Managed Care – PPO | Admitting: Emergency Medicine

## 2020-10-06 ENCOUNTER — Encounter: Payer: Self-pay | Admitting: Emergency Medicine

## 2020-10-06 ENCOUNTER — Other Ambulatory Visit: Payer: Self-pay

## 2020-10-06 VITALS — BP 148/88 | HR 83 | Temp 97.5°F | Resp 16 | Ht 72.0 in | Wt 263.0 lb

## 2020-10-06 DIAGNOSIS — I10 Essential (primary) hypertension: Secondary | ICD-10-CM | POA: Diagnosis not present

## 2020-10-06 DIAGNOSIS — E782 Mixed hyperlipidemia: Secondary | ICD-10-CM | POA: Diagnosis not present

## 2020-10-06 DIAGNOSIS — Z6835 Body mass index (BMI) 35.0-35.9, adult: Secondary | ICD-10-CM

## 2020-10-06 DIAGNOSIS — E118 Type 2 diabetes mellitus with unspecified complications: Secondary | ICD-10-CM

## 2020-10-06 DIAGNOSIS — Z760 Encounter for issue of repeat prescription: Secondary | ICD-10-CM | POA: Diagnosis not present

## 2020-10-06 MED ORDER — METFORMIN HCL 1000 MG PO TABS: 1000.0000 mg | ORAL_TABLET | Freq: Two times a day (BID) | ORAL | 3 refills | Status: DC

## 2020-10-06 NOTE — Progress Notes (Signed)
Gordon Green 59 y.o.   Chief Complaint  Patient presents with  . Medication Refill    Metformin    HISTORY OF PRESENT ILLNESS: This is a 59 y.o. male with history of diabetes here for medication refill.  Takes daily Metformin. Used to see Dr. Pamella Pert.  Scheduled for transfer of care office visit next month with me. No other complaints or medical concerns today. Also has history of hypertension and high triglycerides. Lab Results  Component Value Date   HGBA1C 5.9 06/23/2020    HPI   Prior to Admission medications   Medication Sig Start Date End Date Taking? Authorizing Provider  acetaminophen (TYLENOL) 500 MG tablet Take 500 mg by mouth every 6 (six) hours as needed for mild pain, fever or headache.   Yes [provider]  allopurinol (ZYLOPRIM) 300 MG tablet TAKE 1 TABLET BY MOUTH EVERY DAY 10/06/20  Yes Elayne Snare, MD  fenofibrate micronized (LOFIBRA) 134 MG capsule TAKE 1 CAPSULE BY MOUTH DAILY. 08/25/20  Yes Elayne Snare, MD  lisinopril (ZESTRIL) 10 MG tablet Take 10 mg by mouth daily. 08/05/19  Yes [provider]  Magnesium 500 MG TABS Take 500 mg by mouth daily.   Yes [provider]  metFORMIN (GLUCOPHAGE) 1000 MG tablet Take by mouth 2 (two) times daily with a meal. Take 1/2 tablet (582m total) by mouth in the morning and 1 tablet (1006mtotal) in the evening.    Yes [provider]  niacin (NIASPAN) 500 MG CR tablet TAKE 1 TABLET (500 MG TOTAL) BY MOUTH AT BEDTIME. AFTER 2 WEEKS TAKE 2 PILLS AT NITE 08/29/20  Yes KuElayne SnareMD  omeprazole (PRILOSEC OTC) 20 MG tablet Take 20 mg by mouth daily.   Yes [provider]  VASCEPA 1 g capsule TAKE 2 CAPSULES (2 G TOTAL) BY MOUTH 2 (TWO) TIMES DAILY. 08/16/20  Yes KuElayne SnareMD  blood glucose meter kit and supplies Please check your blood pressure twice a day and keep a log. 11/13/17   CaJola SchmidtMD  CONTOUR NEXT TEST test strip USE AS INSTRUCTED TO TEST BLOOD SUGARS 2 TIMES  DAILY. DX CODE 04/28/18   KuElayne SnareMD  glucose blood (CONTOUR NEXT TEST) test strip Use as instructed to test blood sugars 1 time daily 02/12/20   KuElayne SnareMD  Lancets MISC 1 each by Does not apply route 2 (two) times daily. To be used with Contour glucose meter. Dx: E11.8 12/31/17   KuElayne SnareMD  Potassium Gluconate 595 MG CAPS Take 595 mg by mouth daily. Patient not taking: Reported on 10/06/2020    [provider]  simvastatin (ZOCOR) 20 MG tablet Take 1 tablet (20 mg total) by mouth at bedtime. Patient not taking: Reported on 10/06/2020 02/12/20   KuElayne SnareMD    No Known Allergies  Patient Active Problem List   Diagnosis Date Noted  . Mixed hyperlipidemia 08/08/2018  . History of gout 08/08/2018  . Controlled type 2 diabetes mellitus with complication, without long-term current use of insulin ([place]/06/2018  . Solitary left kidney 08/27/2016  . Renal cancer ([facility] [place]2016    Past Medical History:  Diagnosis Date  . Cancer ([facility]/2005   ;Hx: of right kidney cancer; kidney removed in 2005  . Diabetes mellitus without complication ([facility]  . GERD (gastroesophageal reflux disease)   . Gout    Hx: of  . Hyperlipidemia   . Hypertension     Past Surgical History:  Procedure Laterality Date  . Fx: [place] fx:  Marland Kitchen INGUINAL HERNIA REPAIR     age 22  . KIDNEY SURGERY  02/2004   Hx; of removal of right kidnety in 2005  . ORIF TIBIA PLATEAU Left 08/25/2013   Procedure: OPEN REDUCTION INTERNAL FIXATION (ORIF) TIBIAL PLATEAU;  Surgeon: Renette Butters, MD;  Location: [facility];  Service: Orthopedics;  Laterality: Left;    Social History   Socioeconomic History  . Marital status: Married    Spouse name: Not on file  . Number of children: Not on file  . Years of education: Not on file  . Highest education level: Not on file  Occupational History  . Not on file  Tobacco Use  . Smoking status: Former Smoker    Types: Cigarettes    Quit date:  03/24/2004    Years since quitting: 16.5  . Smokeless tobacco: Never Used  . Tobacco comment: quit smoking cigarettes in 2005  Substance and Sexual Activity  . Alcohol use: Yes    Alcohol/week: 4.0 standard drinks    Types: 2 Cans of beer, 2 Standard drinks or equivalent per week  . Drug use: No  . Sexual activity: Not on file  Other Topics Concern  . Not on file  Social History Narrative  . Not on file   Social Determinants of Health   Financial Resource Strain:   . Difficulty of Paying Living Expenses: Not on file  Food Insecurity:   . Worried About Charity fundraiser in the Last Year: Not on file  . Ran Out of Food in the Last Year: Not on file  Transportation Needs:   . Lack of Transportation (Medical): Not on file  . Lack of Transportation (Non-Medical): Not on file  Physical Activity:   . Days of Exercise per Week: Not on file  . Minutes of Exercise per Session: Not on file  Stress:   . Feeling of Stress : Not on file  Social Connections:   . Frequency of Communication with Friends and Family: Not on file  . Frequency of Social Gatherings with Friends and Family: Not on file  . Attends Religious Services: Not on file  . Active Member of Clubs or Organizations: Not on file  . Attends Archivist Meetings: Not on file  . Marital Status: Not on file  Intimate Partner Violence:   . Fear of Current or Ex-Partner: Not on file  . Emotionally Abused: Not on file  . Physically Abused: Not on file  . Sexually Abused: Not on file    Family History  Problem Relation Age of Onset  . Heart disease Father   . Hyperlipidemia Brother   . Colon cancer Neg Hx   . Esophageal cancer Neg Hx   . Prostate cancer Neg Hx   . Rectal cancer Neg Hx   . Stomach cancer Neg Hx   . Diabetes Neg Hx      Review of Systems  Constitutional: Negative.  Negative for chills and fever.  HENT: Negative.  Negative for congestion and sore throat.   Respiratory: Negative.  Negative for  cough and shortness of breath.   Cardiovascular: Negative.  Negative for chest pain and leg swelling.  Gastrointestinal: Negative.  Negative for abdominal pain, diarrhea, nausea and vomiting.  Genitourinary: Negative.  Negative for dysuria and hematuria.  Musculoskeletal: Negative.  Negative for joint pain and myalgias.  Skin: Negative.  Negative for rash.  Neurological: Negative.  Negative for dizziness and headaches.  All other systems reviewed and are negative.    Physical Exam Vitals reviewed.  Constitutional:      Appearance: Normal appearance.  HENT:     Head: Normocephalic.  Eyes:     Extraocular Movements: Extraocular movements intact.  Cardiovascular:     Rate and Rhythm: Normal rate.  Pulmonary:     Effort: Pulmonary effort is normal.  Skin:    General: Skin is warm and dry.  Neurological:     Mental Status: He is alert and oriented to person, place, and time.  Psychiatric:        Mood and Affect: Mood normal.        Behavior: Behavior normal.      ASSESSMENT & PLAN: Zaydon was seen today for medication refill.  Diagnoses and all orders for this visit:  Controlled type 2 diabetes mellitus with complication, without long-term current use of insulin (HCC) -     HM Diabetes Foot Exam -     metFORMIN (GLUCOPHAGE) 1000 MG tablet; Take 1 tablet (1,000 mg total) by mouth 2 (two) times daily with a meal. Take 1/2 tablet (574m total) by mouth in the morning and 1 tablet (10073mtotal) in the evening.  Encounter for medication refill  Mixed hyperlipidemia  Essential hypertension  Body mass index (BMI) of 35.0-35.9 in adult    Patient Instructions       If you have lab work done today you will be contacted with your lab results within the next 2 weeks.  If you have not heard from usKoreahen please contact usKoreaThe fastest way to get your results is to register for My Chart.   IF you received an x-ray today, you will receive an invoice from [facility]. Please contact [facility] at [phone] questions or concerns regarding your invoice.   IF you received labwork today, you will receive an invoice from [facility] at [phone] questions or concerns regarding your invoice.   Our billing staff will not be able to assist you with questions regarding bills from these companies.  You will be contacted with the lab results as soon as they are available. The fastest way to get your results is to activate your My Chart account. Instructions are located on the last page of this paperwork. If you have not heard from usKoreaegarding the results in 2 weeks, please contact this office.     Health Maintenance, Male Adopting a healthy lifestyle and getting preventive care are important in promoting health and wellness. Ask your health care provider about:  The right schedule for you to have regular tests and exams.  Things you can do on your own to prevent diseases and keep yourself healthy. What should I know about diet, weight, and exercise? Eat a healthy diet   Eat a diet that includes plenty of vegetables, fruits, low-fat dairy products, and lean protein.  Do not eat a lot of foods that are high in solid fats, added sugars, or sodium. Maintain a healthy weight Body mass index (BMI) is a measurement that can be used to identify possible weight problems. It estimates body fat based on height and weight. Your health care provider can help determine your BMI and help you achieve or maintain a healthy weight. Get regular exercise Get regular exercise. This is one of the most important things you can do for your health. Most adults should:  Exercise for at least 150  minutes each week. The exercise should increase your heart rate and make you sweat (moderate-intensity exercise).  Do strengthening exercises at least twice a week. This is in addition to the moderate-intensity exercise.  Spend less  time sitting. Even light physical activity can be beneficial. Watch cholesterol and blood lipids Have your blood tested for lipids and cholesterol at 58 years of age, then have this test every 5 years. You may need to have your cholesterol levels checked more often if:  Your lipid or cholesterol levels are high.  You are older than 58 years of age.  You are at high risk for heart disease. What should I know about cancer screening? Many types of cancers can be detected early and may often be prevented. Depending on your health history and family history, you may need to have cancer screening at various ages. This may include screening for:  Colorectal cancer.  Prostate cancer.  Skin cancer.  Lung cancer. What should I know about heart disease, diabetes, and high blood pressure? Blood pressure and heart disease  High blood pressure causes heart disease and increases the risk of stroke. This is more likely to develop in people who have high blood pressure readings, are of African descent, or are overweight.  Talk with your health care provider about your target blood pressure readings.  Have your blood pressure checked: ? Every 3-5 years if you are 29-63 years of age. ? Every year if you are 74 years old or older.  If you are between the ages of 47 and 7 and are a current or former smoker, ask your health care provider if you should have a one-time screening for abdominal aortic aneurysm (AAA). Diabetes Have regular diabetes screenings. This checks your fasting blood sugar level. Have the screening done:  Once every three years after age 26 if you are at a normal weight and have a low risk for diabetes.  More often and at a younger age if you are overweight or have a high risk for diabetes. What should I know about preventing infection? Hepatitis B If you have a higher risk for hepatitis B, you should be screened for this virus. Talk with your health care provider to find out if  you are at risk for hepatitis B infection. Hepatitis C Blood testing is recommended for:  Everyone born from [birth year] through 1964.  Anyone with known risk factors for hepatitis C. Sexually transmitted infections (STIs)  You should be screened each year for STIs, including gonorrhea and chlamydia, if: ? You are sexually active and are younger than 58 years of age. ? You are older than 58 years of age and your health care provider tells you that you are at risk for this type of infection. ? Your sexual activity has changed since you were last screened, and you are at increased risk for chlamydia or gonorrhea. Ask your health care provider if you are at risk.  Ask your health care provider about whether you are at high risk for HIV. Your health care provider may recommend a prescription medicine to help prevent HIV infection. If you choose to take medicine to prevent HIV, you should first get tested for HIV. You should then be tested every 3 months for as long as you are taking the medicine. Follow these instructions at home: Lifestyle  Do not use any products that contain nicotine or tobacco, such as cigarettes, e-cigarettes, and chewing tobacco. If you need help quitting, ask your health care provider.  Do not use street drugs.  Do not share needles.  Ask your health care provider for help if you need support or information about quitting drugs. Alcohol use  Do not drink alcohol if your health care provider tells you not to drink.  If you drink alcohol: ? Limit how much you have to 0-2 drinks a day. ? Be aware of how much alcohol is in your drink. In the [place] one drink equals one 12 oz bottle of beer (355 mL), one 5 oz glass of wine (148 mL), or one 1 oz glass of hard liquor (44 mL). General instructions  Schedule regular health, dental, and eye exams.  Stay current with your vaccines.  Tell your health care provider if: ? You often feel depressed. ? You have ever been abused or  do not feel safe at home. Summary  Adopting a healthy lifestyle and getting preventive care are important in promoting health and wellness.  Follow your health care provider's instructions about healthy diet, exercising, and getting tested or screened for diseases.  Follow your health care provider's instructions on monitoring your cholesterol and blood pressure. This information is not intended to replace advice given to you by your health care provider. Make sure you discuss any questions you have with your health care provider. Document Revised: 10/15/2018 Document Reviewed: 10/15/2018 Elsevier Patient Education  2020 Elsevier Inc.      Agustina Caroli, MD [facility]

## 2020-10-06 NOTE — Patient Instructions (Addendum)
   If you have lab work done today you will be contacted with your lab results within the next 2 weeks.  If you have not heard from us then please contact us. The fastest way to get your results is to register for My Chart.   IF you received an x-ray today, you will receive an invoice from [facility]. Please contact [facility] at [phone] with questions or concerns regarding your invoice.   IF you received labwork today, you will receive an invoice from [facility]. Please contact [facility] at [phone] with questions or concerns regarding your invoice.   Our billing staff will not be able to assist you with questions regarding bills from these companies.  You will be contacted with the lab results as soon as they are available. The fastest way to get your results is to activate your My Chart account. Instructions are located on the last page of this paperwork. If you have not heard from us regarding the results in 2 weeks, please contact this office.      Health Maintenance, Male Adopting a healthy lifestyle and getting preventive care are important in promoting health and wellness. Ask your health care provider about:  The right schedule for you to have regular tests and exams.  Things you can do on your own to prevent diseases and keep yourself healthy. What should I know about diet, weight, and exercise? Eat a healthy diet   Eat a diet that includes plenty of vegetables, fruits, low-fat dairy products, and lean protein.  Do not eat a lot of foods that are high in solid fats, added sugars, or sodium. Maintain a healthy weight Body mass index (BMI) is a measurement that can be used to identify possible weight problems. It estimates body fat based on height and weight. Your health care provider can help determine your BMI and help you achieve or maintain a healthy weight. Get regular exercise Get regular exercise. This is one of the most important things you  can do for your health. Most adults should:  Exercise for at least 150 minutes each week. The exercise should increase your heart rate and make you sweat (moderate-intensity exercise).  Do strengthening exercises at least twice a week. This is in addition to the moderate-intensity exercise.  Spend less time sitting. Even light physical activity can be beneficial. Watch cholesterol and blood lipids Have your blood tested for lipids and cholesterol at 58 years of age, then have this test every 5 years. You may need to have your cholesterol levels checked more often if:  Your lipid or cholesterol levels are high.  You are older than 58 years of age.  You are at high risk for heart disease. What should I know about cancer screening? Many types of cancers can be detected early and may often be prevented. Depending on your health history and family history, you may need to have cancer screening at various ages. This may include screening for:  Colorectal cancer.  Prostate cancer.  Skin cancer.  Lung cancer. What should I know about heart disease, diabetes, and high blood pressure? Blood pressure and heart disease  High blood pressure causes heart disease and increases the risk of stroke. This is more likely to develop in people who have high blood pressure readings, are of African descent, or are overweight.  Talk with your health care provider about your target blood pressure readings.  Have your blood pressure checked: ? Every 3-5 years if you are 18-39   years of age. ? Every year if you are 40 years old or older.  If you are between the ages of 65 and 75 and are a current or former smoker, ask your health care provider if you should have a one-time screening for abdominal aortic aneurysm (AAA). Diabetes Have regular diabetes screenings. This checks your fasting blood sugar level. Have the screening done:  Once every three years after age 45 if you are at a normal weight and have  a low risk for diabetes.  More often and at a younger age if you are overweight or have a high risk for diabetes. What should I know about preventing infection? Hepatitis B If you have a higher risk for hepatitis B, you should be screened for this virus. Talk with your health care provider to find out if you are at risk for hepatitis B infection. Hepatitis C Blood testing is recommended for:  Everyone born from 1945 through 1965.  Anyone with known risk factors for hepatitis C. Sexually transmitted infections (STIs)  You should be screened each year for STIs, including gonorrhea and chlamydia, if: ? You are sexually active and are younger than 58 years of age. ? You are older than 58 years of age and your health care provider tells you that you are at risk for this type of infection. ? Your sexual activity has changed since you were last screened, and you are at increased risk for chlamydia or gonorrhea. Ask your health care provider if you are at risk.  Ask your health care provider about whether you are at high risk for HIV. Your health care provider may recommend a prescription medicine to help prevent HIV infection. If you choose to take medicine to prevent HIV, you should first get tested for HIV. You should then be tested every 3 months for as long as you are taking the medicine. Follow these instructions at home: Lifestyle  Do not use any products that contain nicotine or tobacco, such as cigarettes, e-cigarettes, and chewing tobacco. If you need help quitting, ask your health care provider.  Do not use street drugs.  Do not share needles.  Ask your health care provider for help if you need support or information about quitting drugs. Alcohol use  Do not drink alcohol if your health care provider tells you not to drink.  If you drink alcohol: ? Limit how much you have to 0-2 drinks a day. ? Be aware of how much alcohol is in your drink. In the [place] one drink equals one 12  oz bottle of beer (355 mL), one 5 oz glass of wine (148 mL), or one 1 oz glass of hard liquor (44 mL). General instructions  Schedule regular health, dental, and eye exams.  Stay current with your vaccines.  Tell your health care provider if: ? You often feel depressed. ? You have ever been abused or do not feel safe at home. Summary  Adopting a healthy lifestyle and getting preventive care are important in promoting health and wellness.  Follow your health care provider's instructions about healthy diet, exercising, and getting tested or screened for diseases.  Follow your health care provider's instructions on monitoring your cholesterol and blood pressure. This information is not intended to replace advice given to you by your health care provider. Make sure you discuss any questions you have with your health care provider. Document Revised: 10/15/2018 Document Reviewed: 10/15/2018 Elsevier Patient Education  2020 Elsevier Inc.  

## 2020-10-10 ENCOUNTER — Other Ambulatory Visit: Payer: Self-pay | Admitting: *Deleted

## 2020-10-10 DIAGNOSIS — E118 Type 2 diabetes mellitus with unspecified complications: Secondary | ICD-10-CM

## 2020-10-10 MED ORDER — METFORMIN HCL 1000 MG PO TABS: 1000.0000 mg | ORAL_TABLET | Freq: Two times a day (BID) | ORAL | 3 refills | Status: DC

## 2020-12-08 ENCOUNTER — Other Ambulatory Visit: Payer: Self-pay | Admitting: Endocrinology

## 2020-12-22 ENCOUNTER — Encounter: Payer: Self-pay | Admitting: Emergency Medicine

## 2020-12-22 ENCOUNTER — Ambulatory Visit: Payer: BC Managed Care – PPO | Admitting: Emergency Medicine

## 2020-12-22 ENCOUNTER — Other Ambulatory Visit: Payer: Self-pay

## 2020-12-22 VITALS — BP 133/76 | HR 85 | Temp 97.5°F | Resp 16 | Ht 73.0 in | Wt 260.0 lb

## 2020-12-22 DIAGNOSIS — E1159 Type 2 diabetes mellitus with other circulatory complications: Secondary | ICD-10-CM | POA: Diagnosis not present

## 2020-12-22 DIAGNOSIS — E785 Hyperlipidemia, unspecified: Secondary | ICD-10-CM

## 2020-12-22 DIAGNOSIS — I152 Hypertension secondary to endocrine disorders: Secondary | ICD-10-CM

## 2020-12-22 DIAGNOSIS — Z7689 Persons encountering health services in other specified circumstances: Secondary | ICD-10-CM

## 2020-12-22 DIAGNOSIS — K5790 Diverticulosis of intestine, part unspecified, without perforation or abscess without bleeding: Secondary | ICD-10-CM

## 2020-12-22 DIAGNOSIS — Z8739 Personal history of other diseases of the musculoskeletal system and connective tissue: Secondary | ICD-10-CM | POA: Diagnosis not present

## 2020-12-22 DIAGNOSIS — Z86010 Personal history of colonic polyps: Secondary | ICD-10-CM

## 2020-12-22 DIAGNOSIS — E1169 Type 2 diabetes mellitus with other specified complication: Secondary | ICD-10-CM | POA: Diagnosis not present

## 2020-12-22 DIAGNOSIS — C649 Malignant neoplasm of unspecified kidney, except renal pelvis: Secondary | ICD-10-CM | POA: Diagnosis not present

## 2020-12-22 LAB — POCT GLYCOSYLATED HEMOGLOBIN (HGB A1C): Hemoglobin A1C: 6.9 % — AB (ref 4.0–5.6)

## 2020-12-22 LAB — GLUCOSE, POCT (MANUAL RESULT ENTRY): POC Glucose: 109 mg/dl — AB (ref 70–99)

## 2020-12-22 NOTE — Patient Instructions (Addendum)
   If you have lab work done today you will be contacted with your lab results within the next 2 weeks.  If you have not heard from us then please contact us. The fastest way to get your results is to register for My Chart.   IF you received an x-ray today, you will receive an invoice from [facility]. Please contact [facility] at [phone] with questions or concerns regarding your invoice.   IF you received labwork today, you will receive an invoice from [facility]. Please contact [facility] at [phone] with questions or concerns regarding your invoice.   Our billing staff will not be able to assist you with questions regarding bills from these companies.  You will be contacted with the lab results as soon as they are available. The fastest way to get your results is to activate your My Chart account. Instructions are located on the last page of this paperwork. If you have not heard from us regarding the results in 2 weeks, please contact this office.      Health Maintenance, Male Adopting a healthy lifestyle and getting preventive care are important in promoting health and wellness. Ask your health care provider about:  The right schedule for you to have regular tests and exams.  Things you can do on your own to prevent diseases and keep yourself healthy. What should I know about diet, weight, and exercise? Eat a healthy diet  Eat a diet that includes plenty of vegetables, fruits, low-fat dairy products, and lean protein.  Do not eat a lot of foods that are high in solid fats, added sugars, or sodium.   Maintain a healthy weight Body mass index (BMI) is a measurement that can be used to identify possible weight problems. It estimates body fat based on height and weight. Your health care provider can help determine your BMI and help you achieve or maintain a healthy weight. Get regular exercise Get regular exercise. This is one of the most important things you  can do for your health. Most adults should:  Exercise for at least 150 minutes each week. The exercise should increase your heart rate and make you sweat (moderate-intensity exercise).  Do strengthening exercises at least twice a week. This is in addition to the moderate-intensity exercise.  Spend less time sitting. Even light physical activity can be beneficial. Watch cholesterol and blood lipids Have your blood tested for lipids and cholesterol at 59 years of age, then have this test every 5 years. You may need to have your cholesterol levels checked more often if:  Your lipid or cholesterol levels are high.  You are older than 59 years of age.  You are at high risk for heart disease. What should I know about cancer screening? Many types of cancers can be detected early and may often be prevented. Depending on your health history and family history, you may need to have cancer screening at various ages. This may include screening for:  Colorectal cancer.  Prostate cancer.  Skin cancer.  Lung cancer. What should I know about heart disease, diabetes, and high blood pressure? Blood pressure and heart disease  High blood pressure causes heart disease and increases the risk of stroke. This is more likely to develop in people who have high blood pressure readings, are of African descent, or are overweight.  Talk with your health care provider about your target blood pressure readings.  Have your blood pressure checked: ? Every 3-5 years if you are   18-39 years of age. ? Every year if you are 40 years old or older.  If you are between the ages of 65 and 75 and are a current or former smoker, ask your health care provider if you should have a one-time screening for abdominal aortic aneurysm (AAA). Diabetes Have regular diabetes screenings. This checks your fasting blood sugar level. Have the screening done:  Once every three years after age 45 if you are at a normal weight and have  a low risk for diabetes.  More often and at a younger age if you are overweight or have a high risk for diabetes. What should I know about preventing infection? Hepatitis B If you have a higher risk for hepatitis B, you should be screened for this virus. Talk with your health care provider to find out if you are at risk for hepatitis B infection. Hepatitis C Blood testing is recommended for:  Everyone born from 1945 through 1965.  Anyone with known risk factors for hepatitis C. Sexually transmitted infections (STIs)  You should be screened each year for STIs, including gonorrhea and chlamydia, if: ? You are sexually active and are younger than 59 years of age. ? You are older than 59 years of age and your health care provider tells you that you are at risk for this type of infection. ? Your sexual activity has changed since you were last screened, and you are at increased risk for chlamydia or gonorrhea. Ask your health care provider if you are at risk.  Ask your health care provider about whether you are at high risk for HIV. Your health care provider may recommend a prescription medicine to help prevent HIV infection. If you choose to take medicine to prevent HIV, you should first get tested for HIV. You should then be tested every 3 months for as long as you are taking the medicine. Follow these instructions at home: Lifestyle  Do not use any products that contain nicotine or tobacco, such as cigarettes, e-cigarettes, and chewing tobacco. If you need help quitting, ask your health care provider.  Do not use street drugs.  Do not share needles.  Ask your health care provider for help if you need support or information about quitting drugs. Alcohol use  Do not drink alcohol if your health care provider tells you not to drink.  If you drink alcohol: ? Limit how much you have to 0-2 drinks a day. ? Be aware of how much alcohol is in your drink. In the [place] one drink equals one 12  oz bottle of beer (355 mL), one 5 oz glass of wine (148 mL), or one 1 oz glass of hard liquor (44 mL). General instructions  Schedule regular health, dental, and eye exams.  Stay current with your vaccines.  Tell your health care provider if: ? You often feel depressed. ? You have ever been abused or do not feel safe at home. Summary  Adopting a healthy lifestyle and getting preventive care are important in promoting health and wellness.  Follow your health care provider's instructions about healthy diet, exercising, and getting tested or screened for diseases.  Follow your health care provider's instructions on monitoring your cholesterol and blood pressure. This information is not intended to replace advice given to you by your health care provider. Make sure you discuss any questions you have with your health care provider. Document Revised: 10/15/2018 Document Reviewed: 10/15/2018 Elsevier Patient Education  2021 Elsevier Inc.  

## 2020-12-22 NOTE — Progress Notes (Signed)
Ward Givens 60 y.o.   Chief Complaint  Patient presents with  . Transitions Of Care    HISTORY OF PRESENT ILLNESS: This is a 60 y.o. male here to establish care with me. Has history of dyslipidemia, hypertension, and diabetes. Doing well.  Has no complaints or medical concerns. Colonoscopy done in 2016 showed moderate diverticulosis and 3 colonic polyps.  Advised to follow-up in 5 years.  HPI   Prior to Admission medications   Medication Sig Start Date End Date Taking? Authorizing Provider  acetaminophen (TYLENOL) 500 MG tablet Take 500 mg by mouth every 6 (six) hours as needed for mild pain, fever or headache.    [provider]  allopurinol (ZYLOPRIM) 300 MG tablet TAKE 1 TABLET BY MOUTH EVERY DAY 10/06/20   Elayne Snare, MD  blood glucose meter kit and supplies Please check your blood pressure twice a day and keep a log. 11/13/17   Jola Schmidt, MD  CONTOUR NEXT TEST test strip USE AS INSTRUCTED TO TEST BLOOD SUGARS 2 TIMES DAILY. DX CODE 04/28/18   Elayne Snare, MD  fenofibrate micronized (LOFIBRA) 134 MG capsule TAKE 1 CAPSULE BY MOUTH DAILY. 12/08/20   Elayne Snare, MD  glucose blood (CONTOUR NEXT TEST) test strip Use as instructed to test blood sugars 1 time daily 02/12/20   Elayne Snare, MD  Lancets MISC 1 each by Does not apply route 2 (two) times daily. To be used with Contour glucose meter. Dx: E11.8 12/31/17   Elayne Snare, MD  lisinopril (ZESTRIL) 10 MG tablet Take 10 mg by mouth daily. 08/05/19   [provider]  Magnesium 500 MG TABS Take 500 mg by mouth daily.    [provider]  metFORMIN (GLUCOPHAGE) 1000 MG tablet Take 1 tablet (1,000 mg total) by mouth 2 (two) times daily with a meal. 10/10/20   Jeffren Dombek, Ines Bloomer, MD  niacin (NIASPAN) 500 MG CR tablet TAKE 1 TABLET (500 MG TOTAL) BY MOUTH AT BEDTIME. AFTER 2 WEEKS TAKE 2 PILLS AT NITE 08/29/20   Elayne Snare, MD  omeprazole (PRILOSEC OTC) 20 MG tablet Take 20 mg by mouth daily.    [provider]  Potassium Gluconate 595 MG CAPS Take 595 mg by mouth daily. Patient not taking: Reported on 10/06/2020    [provider]  simvastatin (ZOCOR) 20 MG tablet Take 1 tablet (20 mg total) by mouth at bedtime. Patient not taking: Reported on 10/06/2020 02/12/20   Elayne Snare, MD  VASCEPA 1 g capsule TAKE 2 CAPSULES (2 G TOTAL) BY MOUTH 2 (TWO) TIMES DAILY. 12/08/20   Elayne Snare, MD    No Known Allergies  Patient Active Problem List   Diagnosis Date Noted  . Hypertension associated with diabetes ([place]) 12/22/2020  . Mixed hyperlipidemia 08/08/2018  . History of gout 08/08/2018  . Dyslipidemia associated with type 2 diabetes mellitus ([place]) 12/13/2017  . Solitary left kidney 08/27/2016  . Renal cancer ([place]) 04/24/2015    Past Medical History:  Diagnosis Date  . Cancer ([place]) 02/2004   ;Hx: of right kidney cancer; kidney removed in 2005  . Diabetes mellitus without complication ([place])   . GERD (gastroesophageal reflux disease)   . Gout    Hx: of  . Hyperlipidemia   . Hypertension     Past Surgical History:  Procedure Laterality Date  . FRACTURE SURGERY N/A    [employer] 12/21/2020  . Fx: [place] fx:  Marland Kitchen HERNIA REPAIR N/A  [employer] 12/21/2020  . INGUINAL HERNIA REPAIR     age 33  . KIDNEY SURGERY  02/2004   Hx; of removal of right kidnety in 2005  . ORIF TIBIA PLATEAU Left 08/25/2013   Procedure: OPEN REDUCTION INTERNAL FIXATION (ORIF) TIBIAL PLATEAU;  Surgeon: Renette Butters, MD;  Location: [facility];  Service: Orthopedics;  Laterality: Left;    Social History   Socioeconomic History  . Marital status: Married    Spouse name: Not on file  . Number of children: Not on file  . Years of education: Not on file  . Highest education level: Not on file  Occupational History  . Not on file  Tobacco Use  . Smoking status: Former Smoker    Types: Cigarettes    Quit date: 03/24/2004    Years since quitting: 16.7  . Smokeless tobacco: Never Used  .  Tobacco comment: quit smoking cigarettes in 2005  Substance and Sexual Activity  . Alcohol use: Yes    Alcohol/week: 4.0 standard drinks    Types: 2 Cans of beer, 2 Standard drinks or equivalent per week  . Drug use: No  . Sexual activity: Not on file  Other Topics Concern  . Not on file  Social History Narrative  . Not on file   Social Determinants of Health   Financial Resource Strain: Not on file  Food Insecurity: Not on file  Transportation Needs: Not on file  Physical Activity: Not on file  Stress: Not on file  Social Connections: Not on file  Intimate Partner Violence: Not on file    Family History  Problem Relation Age of Onset  . Heart disease Father   . Hyperlipidemia Brother   . Colon cancer Neg Hx   . Esophageal cancer Neg Hx   . Prostate cancer Neg Hx   . Rectal cancer Neg Hx   . Stomach cancer Neg Hx   . Diabetes Neg Hx      Review of Systems  Constitutional: Negative.  Negative for chills and fever.  HENT: Negative.  Negative for congestion and sore throat.   Respiratory: Negative.  Negative for cough and shortness of breath.   Cardiovascular: Negative.  Negative for chest pain and palpitations.  Gastrointestinal: Negative.  Negative for abdominal pain, blood in stool, melena, nausea and vomiting.  Genitourinary: Negative.  Negative for dysuria and hematuria.  Skin: Negative.  Negative for rash.  Neurological: Negative.  Negative for dizziness.  All other systems reviewed and are negative.    Today's Vitals   12/22/20 1330  BP: 133/76  Pulse: 85  Resp: 16  Temp: (!) 97.5 F (36.4 C)  TempSrc: Temporal  SpO2: 95%  Weight: 260 lb (117.9 kg)  Height: 6' 1"  (1.854 m)   Body mass index is 34.3 kg/m. Wt Readings from Last 3 Encounters:  12/22/20 260 lb (117.9 kg)  10/06/20 263 lb (119.3 kg)  06/29/20 253 lb (114.8 kg)     Physical Exam Vitals reviewed.  Constitutional:      Appearance: Normal appearance.  HENT:     Head: Normocephalic.   Eyes:     Extraocular Movements: Extraocular movements intact.     Pupils: Pupils are equal, round, and reactive to light.  Cardiovascular:     Rate and Rhythm: Normal rate and regular rhythm.     Pulses: Normal pulses.     Heart sounds: Normal heart sounds.  Pulmonary:     Effort: Pulmonary effort is normal.  Breath sounds: Normal breath sounds.  Musculoskeletal:        General: Normal range of motion.     Cervical back: Normal range of motion and neck supple.  Skin:    General: Skin is warm and dry.     Capillary Refill: Capillary refill takes less than 2 seconds.  Neurological:     General: No focal deficit present.     Mental Status: He is alert and oriented to person, place, and time.  Psychiatric:        Mood and Affect: Mood normal.        Behavior: Behavior normal.    Results for orders placed or performed in visit on 12/22/20 (from the past 24 hour(s))  POCT glucose (manual entry)     Status: Abnormal   Collection Time: 12/22/20  1:40 PM  Result Value Ref Range   POC Glucose 109 (A) 70 - 99 mg/dl  POCT glycosylated hemoglobin (Hb A1C)     Status: Abnormal   Collection Time: 12/22/20  1:46 PM  Result Value Ref Range   Hemoglobin A1C 6.9 (A) 4.0 - 5.6 %   HbA1c POC (<> result, manual entry)     HbA1c, POC (prediabetic range)     HbA1c, POC (controlled diabetic range)       ASSESSMENT & PLAN: Clinically stable.  No medical concerns identified during this visit. Continue present medications.  No changes. Diet and nutrition discussed. Follow-up in 6 months.  Hypertension associated with diabetes ([place]) Well-controlled hypertension.  Continue present medications. Hemoglobin A1c at 6.9.  Higher than before.  Has not been taking Metformin twice a day every day.  Noncompliant with diet. Start taking Metformin 1000 mg twice a day every day.  Diet and nutrition discussed. Follow-up in 6 months.    Cheyne was seen today for transitions of care and  diabetes.  Diagnoses and all orders for this visit:  Hypertension associated with diabetes ([place]) -     Comprehensive metabolic panel -     POCT glucose (manual entry) -     POCT glycosylated hemoglobin (Hb A1C) -     Ambulatory referral to Ophthalmology  Dyslipidemia associated with type 2 diabetes mellitus ([place]) -     Lipid panel  Malignant neoplasm of kidney, unspecified laterality ([place])  History of gout  Diverticulosis  Hx of colonic polyps  Encounter to establish care    Patient Instructions       If you have lab work done today you will be contacted with your lab results within the next 2 weeks.  If you have not heard from [place] then please contact us. The fastest way to get your results is to register for My Chart.   IF you received an x-ray today, you will receive an invoice from [facility]. Please contact [facility] at [phone] with questions or concerns regarding your invoice.   IF you received labwork today, you will receive an invoice from [facility]. Please contact [facility] at [phone] with questions or concerns regarding your invoice.   Our billing staff will not be able to assist you with questions regarding bills from these companies.  You will be contacted with the lab results as soon as they are available. The fastest way to get your results is to activate your My Chart account. Instructions are located on the last page of this paperwork. If you have not heard from [place] regarding the results in 2 weeks, please contact this office.  Health Maintenance, Male Adopting a healthy lifestyle and getting preventive care are important in promoting health and wellness. Ask your health care provider about:  The right schedule for you to have regular tests and exams.  Things you can do on your own to prevent diseases and keep yourself healthy. What should I know about diet, weight, and exercise? Eat a healthy diet  Eat a diet that  includes plenty of vegetables, fruits, low-fat dairy products, and lean protein.  Do not eat a lot of foods that are high in solid fats, added sugars, or sodium.   Maintain a healthy weight Body mass index (BMI) is a measurement that can be used to identify possible weight problems. It estimates body fat based on height and weight. Your health care provider can help determine your BMI and help you achieve or maintain a healthy weight. Get regular exercise Get regular exercise. This is one of the most important things you can do for your health. Most adults should:  Exercise for at least 150 minutes each week. The exercise should increase your heart rate and make you sweat (moderate-intensity exercise).  Do strengthening exercises at least twice a week. This is in addition to the moderate-intensity exercise.  Spend less time sitting. Even light physical activity can be beneficial. Watch cholesterol and blood lipids Have your blood tested for lipids and cholesterol at 59 years of age, then have this test every 5 years. You may need to have your cholesterol levels checked more often if:  Your lipid or cholesterol levels are high.  You are older than 59 years of age.  You are at high risk for heart disease. What should I know about cancer screening? Many types of cancers can be detected early and may often be prevented. Depending on your health history and family history, you may need to have cancer screening at various ages. This may include screening for:  Colorectal cancer.  Prostate cancer.  Skin cancer.  Lung cancer. What should I know about heart disease, diabetes, and high blood pressure? Blood pressure and heart disease  High blood pressure causes heart disease and increases the risk of stroke. This is more likely to develop in people who have high blood pressure readings, are of African descent, or are overweight.  Talk with your health care provider about your target blood  pressure readings.  Have your blood pressure checked: ? Every 3-5 years if you are 53-101 years of age. ? Every year if you are 31 years old or older.  If you are between the ages of 52 and 28 and are a current or former smoker, ask your health care provider if you should have a one-time screening for abdominal aortic aneurysm (AAA). Diabetes Have regular diabetes screenings. This checks your fasting blood sugar level. Have the screening done:  Once every three years after age 63 if you are at a normal weight and have a low risk for diabetes.  More often and at a younger age if you are overweight or have a high risk for diabetes. What should I know about preventing infection? Hepatitis B If you have a higher risk for hepatitis B, you should be screened for this virus. Talk with your health care provider to find out if you are at risk for hepatitis B infection. Hepatitis C Blood testing is recommended for:  Everyone born from [birth year] through 1965.  Anyone with known risk factors for hepatitis C. Sexually transmitted infections (STIs)  You should be screened  each year for STIs, including gonorrhea and chlamydia, if: ? You are sexually active and are younger than 59 years of age. ? You are older than 59 years of age and your health care provider tells you that you are at risk for this type of infection. ? Your sexual activity has changed since you were last screened, and you are at increased risk for chlamydia or gonorrhea. Ask your health care provider if you are at risk.  Ask your health care provider about whether you are at high risk for HIV. Your health care provider may recommend a prescription medicine to help prevent HIV infection. If you choose to take medicine to prevent HIV, you should first get tested for HIV. You should then be tested every 3 months for as long as you are taking the medicine. Follow these instructions at home: Lifestyle  Do not use any products that contain  nicotine or tobacco, such as cigarettes, e-cigarettes, and chewing tobacco. If you need help quitting, ask your health care provider.  Do not use street drugs.  Do not share needles.  Ask your health care provider for help if you need support or information about quitting drugs. Alcohol use  Do not drink alcohol if your health care provider tells you not to drink.  If you drink alcohol: ? Limit how much you have to 0-2 drinks a day. ? Be aware of how much alcohol is in your drink. In the [place] one drink equals one 12 oz bottle of beer (355 mL), one 5 oz glass of wine (148 mL), or one 1 oz glass of hard liquor (44 mL). General instructions  Schedule regular health, dental, and eye exams.  Stay current with your vaccines.  Tell your health care provider if: ? You often feel depressed. ? You have ever been abused or do not feel safe at home. Summary  Adopting a healthy lifestyle and getting preventive care are important in promoting health and wellness.  Follow your health care provider's instructions about healthy diet, exercising, and getting tested or screened for diseases.  Follow your health care provider's instructions on monitoring your cholesterol and blood pressure. This information is not intended to replace advice given to you by your health care provider. Make sure you discuss any questions you have with your health care provider. Document Revised: 10/15/2018 Document Reviewed: 10/15/2018 Elsevier Patient Education  2021 Elsevier Inc.      Agustina Caroli, MD [facility]

## 2020-12-22 NOTE — Assessment & Plan Note (Signed)
Well-controlled hypertension.  Continue present medications. Hemoglobin A1c at 6.9.  Higher than before.  Has not been taking Metformin twice a day every day.  Noncompliant with diet. Start taking Metformin 1000 mg twice a day every day.  Diet and nutrition discussed. Follow-up in 6 months.

## 2020-12-23 ENCOUNTER — Other Ambulatory Visit: Payer: Self-pay | Admitting: Emergency Medicine

## 2020-12-23 DIAGNOSIS — E781 Pure hyperglyceridemia: Secondary | ICD-10-CM

## 2020-12-23 DIAGNOSIS — E785 Hyperlipidemia, unspecified: Secondary | ICD-10-CM

## 2020-12-23 LAB — COMPREHENSIVE METABOLIC PANEL
ALT: 57 IU/L — ABNORMAL HIGH (ref 0–44)
AST: 43 IU/L — ABNORMAL HIGH (ref 0–40)
Albumin/Globulin Ratio: 1.6 (ref 1.2–2.2)
Albumin: 4.7 g/dL (ref 3.8–4.9)
Alkaline Phosphatase: 58 IU/L (ref 44–121)
BUN/Creatinine Ratio: 25 — ABNORMAL HIGH (ref 9–20)
BUN: 31 mg/dL — ABNORMAL HIGH (ref 6–24)
Bilirubin Total: 0.3 mg/dL (ref 0.0–1.2)
CO2: 16 mmol/L — ABNORMAL LOW (ref 20–29)
Calcium: 9.8 mg/dL (ref 8.7–10.2)
Chloride: 100 mmol/L (ref 96–106)
Creatinine, Ser: 1.23 mg/dL (ref 0.76–1.27)
GFR calc Af Amer: 74 mL/min/{1.73_m2} (ref >59)
GFR calc non Af Amer: 64 mL/min/{1.73_m2} (ref >59)
Globulin, Total: 2.9 g/dL (ref 1.5–4.5)
Glucose: 102 mg/dL — ABNORMAL HIGH (ref 65–99)
Potassium: 4.6 mmol/L (ref 3.5–5.2)
Sodium: 137 mmol/L (ref 134–144)
Total Protein: 7.6 g/dL (ref 6.0–8.5)

## 2020-12-23 LAB — LIPID PANEL
Chol/HDL Ratio: 9.8 ratio — ABNORMAL HIGH (ref 0.0–5.0)
Cholesterol, Total: 206 mg/dL — ABNORMAL HIGH (ref 100–199)
HDL: 21 mg/dL — ABNORMAL LOW (ref >39)
Triglycerides: 1021 mg/dL (ref 0–149)

## 2021-01-03 DIAGNOSIS — N179 Acute kidney failure, unspecified: Secondary | ICD-10-CM | POA: Diagnosis not present

## 2021-01-03 DIAGNOSIS — I129 Hypertensive chronic kidney disease with stage 1 through stage 4 chronic kidney disease, or unspecified chronic kidney disease: Secondary | ICD-10-CM | POA: Diagnosis not present

## 2021-01-03 DIAGNOSIS — N182 Chronic kidney disease, stage 2 (mild): Secondary | ICD-10-CM | POA: Diagnosis not present

## 2021-01-03 DIAGNOSIS — R809 Proteinuria, unspecified: Secondary | ICD-10-CM | POA: Diagnosis not present

## 2021-01-08 ENCOUNTER — Other Ambulatory Visit: Payer: Self-pay | Admitting: Endocrinology

## 2021-01-17 DIAGNOSIS — N182 Chronic kidney disease, stage 2 (mild): Secondary | ICD-10-CM | POA: Diagnosis not present

## 2021-01-27 ENCOUNTER — Ambulatory Visit: Payer: BC Managed Care – PPO | Admitting: Internal Medicine

## 2021-02-18 ENCOUNTER — Other Ambulatory Visit: Payer: Self-pay | Admitting: Endocrinology

## 2021-02-21 DIAGNOSIS — N182 Chronic kidney disease, stage 2 (mild): Secondary | ICD-10-CM | POA: Diagnosis not present

## 2021-03-13 ENCOUNTER — Other Ambulatory Visit: Payer: Self-pay | Admitting: Endocrinology

## 2021-04-12 ENCOUNTER — Other Ambulatory Visit: Payer: Self-pay | Admitting: Endocrinology

## 2021-04-12 ENCOUNTER — Other Ambulatory Visit: Payer: Self-pay | Admitting: Emergency Medicine

## 2021-04-12 DIAGNOSIS — E118 Type 2 diabetes mellitus with unspecified complications: Secondary | ICD-10-CM

## 2021-04-19 ENCOUNTER — Other Ambulatory Visit: Payer: Self-pay | Admitting: Endocrinology

## 2021-04-19 DIAGNOSIS — Z8739 Personal history of other diseases of the musculoskeletal system and connective tissue: Secondary | ICD-10-CM

## 2021-04-25 ENCOUNTER — Other Ambulatory Visit: Payer: Self-pay

## 2021-04-25 ENCOUNTER — Other Ambulatory Visit (INDEPENDENT_AMBULATORY_CARE_PROVIDER_SITE_OTHER): Payer: BC Managed Care – PPO

## 2021-04-25 DIAGNOSIS — Z8739 Personal history of other diseases of the musculoskeletal system and connective tissue: Secondary | ICD-10-CM

## 2021-04-25 DIAGNOSIS — E782 Mixed hyperlipidemia: Secondary | ICD-10-CM | POA: Diagnosis not present

## 2021-04-25 DIAGNOSIS — E669 Obesity, unspecified: Secondary | ICD-10-CM | POA: Diagnosis not present

## 2021-04-25 DIAGNOSIS — E119 Type 2 diabetes mellitus without complications: Secondary | ICD-10-CM

## 2021-04-25 DIAGNOSIS — E1169 Type 2 diabetes mellitus with other specified complication: Secondary | ICD-10-CM

## 2021-04-25 LAB — COMPREHENSIVE METABOLIC PANEL
ALT: 41 U/L (ref 0–53)
AST: 28 U/L (ref 0–37)
Albumin: 4.6 g/dL (ref 3.5–5.2)
Alkaline Phosphatase: 46 U/L (ref 39–117)
BUN: 35 mg/dL — ABNORMAL HIGH (ref 6–23)
CO2: 28 mEq/L (ref 19–32)
Calcium: 10.1 mg/dL (ref 8.4–10.5)
Chloride: 100 mEq/L (ref 96–112)
Creatinine, Ser: 1.44 mg/dL (ref 0.40–1.50)
GFR: 53.31 mL/min — ABNORMAL LOW (ref >60.00)
Glucose, Bld: 99 mg/dL (ref 70–99)
Potassium: 4.8 mEq/L (ref 3.5–5.1)
Sodium: 137 mEq/L (ref 135–145)
Total Bilirubin: 0.4 mg/dL (ref 0.2–1.2)
Total Protein: 7.7 g/dL (ref 6.0–8.3)

## 2021-04-25 LAB — LIPID PANEL
Cholesterol: 193 mg/dL (ref 0–200)
HDL: 28.6 mg/dL — ABNORMAL LOW (ref >39.00)
Total CHOL/HDL Ratio: 7
Triglycerides: 514 mg/dL — ABNORMAL HIGH (ref 0.0–149.0)

## 2021-04-25 LAB — URIC ACID: Uric Acid, Serum: 4.8 mg/dL (ref 4.0–7.8)

## 2021-04-25 LAB — HEMOGLOBIN A1C: Hgb A1c MFr Bld: 5.8 % (ref 4.6–6.5)

## 2021-04-26 LAB — LDL CHOLESTEROL, DIRECT: Direct LDL: 108 mg/dL

## 2021-04-27 ENCOUNTER — Other Ambulatory Visit: Payer: Self-pay

## 2021-04-27 ENCOUNTER — Encounter: Payer: Self-pay | Admitting: Endocrinology

## 2021-04-27 ENCOUNTER — Ambulatory Visit: Payer: BC Managed Care – PPO | Admitting: Endocrinology

## 2021-04-27 VITALS — BP 142/84 | HR 85 | Ht 73.0 in | Wt 245.8 lb

## 2021-04-27 DIAGNOSIS — E669 Obesity, unspecified: Secondary | ICD-10-CM | POA: Diagnosis not present

## 2021-04-27 DIAGNOSIS — E1169 Type 2 diabetes mellitus with other specified complication: Secondary | ICD-10-CM

## 2021-04-27 DIAGNOSIS — E782 Mixed hyperlipidemia: Secondary | ICD-10-CM

## 2021-04-27 DIAGNOSIS — I10 Essential (primary) hypertension: Secondary | ICD-10-CM | POA: Diagnosis not present

## 2021-04-27 NOTE — Patient Instructions (Addendum)
Slo-niacin 500mg  after dinner and after 2 weeks 1000mg  daily.

## 2021-04-27 NOTE — Progress Notes (Signed)
Patient ID: Gordon Green, male   DOB: Feb 05, 1961, 60 y.o.   MRN: [id number]          Reason for Appointment: Follow-up for various problems    History of Present Illness:          Date of diagnosis of type 2 diabetes mellitus: 11/2017        Background history:       He had presented to his urgent care center with symptoms of excessive thirst, urination, fatigue and blurred vision starting in late December following a significant respiratory infection His blood sugar was markedly increased and he was referred for admission, however was treated in the emergency room for his blood sugar of 409 with IV fluids and an injection of insulin.  He did not have any renal function abnormalities or acidosis He was sent home on metformin 1 g twice a day  His baseline A1c at diagnosis was 11.6   Recent history:  Non-insulin hypoglycemic drugs the patient is taking are: Metformin 500 at breakfast and 1 g dinner, Farxiga 10 mg daily   A1c is 5.8 compared to 6.9 earlier this year  Current management, blood sugar patterns and problems identified: He had apparently taken his metformin irregularly when seen by his PCP in 2/22, also had not followed up here after his last visit in 8/21  Since his A1c had gone up he started taking his metformin regularly 1 g twice daily but now is only taking half a tablet in the morning Most of his monitoring is done after breakfast or other meals but has 1 high reading fasting Lab nonfasting glucose 99  He is now trying to cut back significantly on carbohydrates His nephrologist saw him in 3/22 and started him on Farxiga 10 mg daily but patient did not understand that this would be effective for his diabetes control and also He is not having any side effects with this and reportedly has had follow-up renal function with nephrologist He has now lost 15 pounds since February        Side effects from medications have been: Nausea with 1 g metformin in the  morning    Glucose monitoring:   Frequency of monitoring    less than once a day    Glucometer:  Contour .      Blood Glucose readings from monitor   AVERAGE 130  After breakfast average 131 and late afternoon average 129 Blood sugar range 83-165   PREVIOUS blood sugar range 104-178 Most blood sugars in the mornings with average about 142, evening blood sugar average about 120 Overall AVERAGE 134      Dietician visit, most recent: 01/2018               Exercise:  He is doing walking or other activities like yard work  Weight history:  Wt Readings from Last 3 Encounters:  04/27/21 245 lb 12.8 oz (111.5 kg)  12/22/20 260 lb (117.9 kg)  10/06/20 263 lb (119.3 kg)    Glycemic control:   Lab Results  Component Value Date   HGBA1C 5.8 04/25/2021   HGBA1C 6.9 (A) 12/22/2020   HGBA1C 5.9 06/23/2020   Lab Results  Component Value Date   MICROALBUR 75.1 (H) 06/23/2020   LDLCALC Comment (A) 12/22/2020   CREATININE 1.44 04/25/2021   Lab Results  Component Value Date   MICRALBCREAT 59.1 (H) 06/23/2020    No results found for: FRUCTOSAMINE  LIPID management: Discussed in review of systems  Allergies as of 04/27/2021   No Known Allergies      Medication List        Accurate as of April 27, 2021  9:30 AM. If you have any questions, ask your nurse or doctor.          acetaminophen 500 MG tablet Commonly known as: TYLENOL Take 500 mg by mouth every 6 (six) hours as needed for mild pain, fever or headache.   allopurinol 300 MG tablet Commonly known as: ZYLOPRIM TAKE 1 TABLET BY MOUTH EVERY DAY   blood glucose meter kit and supplies Please check your blood pressure twice a day and keep a log.   Contour Next Test test strip Generic drug: glucose blood USE AS INSTRUCTED TO TEST BLOOD SUGARS 2 TIMES DAILY. DX CODE   Contour Next Test test strip Generic drug: glucose blood USE AS INSTRUCTED TO TEST BLOOD SUGARS 1 TIME DAILY   fenofibrate micronized 134 MG  capsule Commonly known as: LOFIBRA TAKE 1 CAPSULE BY MOUTH EVERY DAY   icosapent Ethyl 1 g capsule Commonly known as: Vascepa TAKE 2 CAPSULES BY MOUTH 2 TIMES DAILY.   Lancets Misc 1 each by Does not apply route 2 (two) times daily. To be used with Contour glucose meter. Dx: E11.8   lisinopril 10 MG tablet Commonly known as: ZESTRIL Take 10 mg by mouth daily.   Magnesium 500 MG Tabs Take 500 mg by mouth daily.   metFORMIN 1000 MG tablet Commonly known as: GLUCOPHAGE TAKE 1 TABLET (1,000 MG TOTAL) BY MOUTH 2 (TWO) TIMES DAILY WITH A MEAL.   niacin 500 MG CR tablet Commonly known as: NIASPAN TAKE 1 TABLET (500 MG TOTAL) BY MOUTH AT BEDTIME. AFTER 2 WEEKS TAKE 2 PILLS AT NITE   omeprazole 20 MG tablet Commonly known as: PRILOSEC OTC Take 20 mg by mouth daily.   Potassium Gluconate 595 MG Caps Take 595 mg by mouth daily.   QC TUMERIC COMPLEX PO Take 1 capsule by mouth as needed.   simvastatin 20 MG tablet Commonly known as: ZOCOR Take 1 tablet (20 mg total) by mouth at bedtime.   vitamin C 1000 MG tablet Take 1,000 mg by mouth daily.   VITAMIN D PO Take 5,000 Units by mouth daily.   Vitamin D3 125 MCG (5000 UT) Caps Take 1 capsule by mouth daily.        Allergies: No Known Allergies  Past Medical History:  Diagnosis Date   Cancer ([place]) 02/2004   ;Hx: of right kidney cancer; kidney removed in 2005   Diabetes mellitus without complication ([place])    GERD (gastroesophageal reflux disease)    Gout    Hx: of   Hyperlipidemia    Hypertension     Past Surgical History:  Procedure Laterality Date   FRACTURE SURGERY N/A    [employer] 12/21/2020   Fx: tibula     Plateau fx:   HERNIA REPAIR N/A    [employer] 12/21/2020   INGUINAL HERNIA REPAIR     age 63   KIDNEY SURGERY  02/2004   Hx; of removal of right kidnety in 2005   ORIF TIBIA PLATEAU Left 08/25/2013   Procedure: OPEN REDUCTION INTERNAL FIXATION (ORIF) TIBIAL PLATEAU;  Surgeon: Renette Butters, MD;   Location: [facility];  Service: Orthopedics;  Laterality: Left;    Family History  Problem Relation Age of Onset   Heart disease Father    Hyperlipidemia Brother    Colon cancer Neg Hx  Esophageal cancer Neg Hx    Prostate cancer Neg Hx    Rectal cancer Neg Hx    Stomach cancer Neg Hx    Diabetes Neg Hx     Social History:  reports that he quit smoking about 17 years ago. His smoking use included cigarettes. He has never used smokeless tobacco. He reports current alcohol use of about 4.0 standard drinks of alcohol per week. He reports that he does not use drugs.   Review of Systems   Lipid history:   He had been on fenofibrate along with Vascepa since 11/2018 Previously had baseline triglycerides over 800 LDL has been previously below 100 without a statin drug  Nonfasting triglycerides are over 500 now He does have a relatively high fat diet while cutting back on carbohydrates  Has lost significant amount of weight  He did not start Niaspan because of insurance not covering this and also not taking simvastatin   Lab Results  Component Value Date   CHOL 193 04/25/2021   CHOL 206 (H) 12/22/2020   CHOL 129 06/23/2020   Lab Results  Component Value Date   HDL 28.60 (L) 04/25/2021   HDL 21 (L) 12/22/2020   HDL 22.90 (L) 06/23/2020   Lab Results  Component Value Date   LDLCALC Comment (A) 12/22/2020   [place] Comment 11/06/2018   LDLCALC 104 (H) 08/01/2018   Lab Results  Component Value Date   TRIG (H) 04/25/2021    514.0 Triglyceride is over 400; calculations on Lipids are invalid.   TRIG 1,021 (HH) 12/22/2020   TRIG 396.0 (H) 06/23/2020   Lab Results  Component Value Date   CHOLHDL 7 04/25/2021   CHOLHDL 9.8 (H) 12/22/2020   CHOLHDL 6 06/23/2020   Lab Results  Component Value Date   LDLDIRECT 108.0 04/25/2021   LDLDIRECT 53.0 06/23/2020   LDLDIRECT 91.0 02/09/2020        Lab Results  Component Value Date   ALT 41 04/25/2021        Hypertension: On  treatment since 2005  Now followed by nephrologist also has proteinuria and history of nephrectomy Microalbumin is last normal in 4/20  Blood pressure recently at home 131/81  Not on HCTZ and on 20  mg lisinopril, also on Farxiga 10 mg since 3/22  BP Readings from Last 3 Encounters:  04/27/21 (!) 142/84  12/22/20 133/76  10/06/20 (!) 148/88   Has borderline renal function as before  Lab Results  Component Value Date   CREATININE 1.44 04/25/2021   CREATININE 1.23 12/22/2020   CREATININE 1.43 06/23/2020    Most recent eye exam: 04/2019  Most recent foot exam: 2/20    LABS:  Lab on 04/25/2021  Component Date Value Ref Range Status   Uric Acid, Serum 04/25/2021 4.8  4.0 - 7.8 mg/dL Final   Cholesterol 04/25/2021 193  0 - 200 mg/dL Final   ATP III Classification       Desirable:  < 200 mg/dL               Borderline High:  200 - 239 mg/dL          High:  > = 240 mg/dL   Triglycerides 04/25/2021 514.0 Triglyceride is over 400; calculations on Lipids are invalid. (A) 0.0 - 149.0 mg/dL Final   Normal:  <150 mg/dLBorderline High:  150 - 199 mg/dL   HDL 04/25/2021 28.60 (A) >39.00 mg/dL Final   Total CHOL/HDL Ratio 04/25/2021 7   Final  Men          Women1/2 Average Risk     3.4          3.3Average Risk          5.0          4.42X Average Risk          9.6          7.13X Average Risk          15.0          11.0                       Sodium 04/25/2021 137  135 - 145 mEq/L Final   Potassium 04/25/2021 4.8  3.5 - 5.1 mEq/L Final   Chloride 04/25/2021 100  96 - 112 mEq/L Final   CO2 04/25/2021 28  19 - 32 mEq/L Final   Glucose, Bld 04/25/2021 99  70 - 99 mg/dL Final   BUN 04/25/2021 35 (A) 6 - 23 mg/dL Final   Creatinine, Ser 04/25/2021 1.44  0.40 - 1.50 mg/dL Final   Total Bilirubin 04/25/2021 0.4  0.2 - 1.2 mg/dL Final   Alkaline Phosphatase 04/25/2021 46  39 - 117 U/L Final   AST 04/25/2021 28  0 - 37 U/L Final   ALT 04/25/2021 41  0 - 53 U/L Final   Total  Protein 04/25/2021 7.7  6.0 - 8.3 g/dL Final   Albumin 04/25/2021 4.6  3.5 - 5.2 g/dL Final   GFR 04/25/2021 53.31 (A) >60.00 mL/min Final   Calculated using the CKD-EPI Creatinine Equation (2021)   Calcium 04/25/2021 10.1  8.4 - 10.5 mg/dL Final   Hgb A1c MFr Bld 04/25/2021 5.8  4.6 - 6.5 % Final   Glycemic Control Guidelines for People with Diabetes:Non Diabetic:  <6%Goal of Therapy: <7%Additional Action Suggested:  >8%    Direct LDL 04/25/2021 108.0  mg/dL Final   Optimal:  <100 mg/dLNear or Above Optimal:  100-129 mg/dLBorderline High:  130-159 mg/dLHigh:  160-189 mg/dLVery High:  >190 mg/dL    Physical Examination:  BP (!) 142/84   Pulse 85   Ht _0  (1.854 m)   Wt 245 lb 12.8 oz (111.5 kg)   SpO2 95%   BMI 32.43 kg/m       ASSESSMENT:  HYPERTRIGLYCERIDEMIA: He has had persistent hypertriglyceridemia This is still not controlled with triglycerides over 500 but labs were done nonfasting He appears to have a relatively high fat diet  He is not taking the prescription Niaspan in addition to Vascepa and fenofibrate and recommended because of cost   Diabetes type 2 with obesity, on metformin  See history of present illness for discussion of current diabetes management, blood sugar patterns and problems identified  His A1c is excellent at 5.8, now on metformin and [place]  Although Farxiga was started by nephrologist he is significantly benefiting with weight loss and better blood sugar control He has cut back on carbohydrates which is likely helping also  Hypertension: Followed by nephrologist   PLAN:   Start back on high fat foods, was given list of high saturated fat foods to cut back on He can incorporate some whole-grain carbohydrates  Trial of Slo-Niacin OTC 500 mg at dinnertime or bedtime for 2 weeks and then 1000 mg  If he has a difficulty with flushing he can take 2 tablets of coated 81 mg aspirin prior to this No change in metformin or [place]  Will await  labs from nephrologist for last visit but will check microalbumin on next visit also  Check blood pressure regularly at home  There are no Patient Instructions on file for this visit.      Elayne Snare 04/27/2021, 9:30 AM   Note: This office note was prepared with Dragon voice recognition system technology. Any transcriptional errors that result from this process are unintentional.

## 2021-05-09 ENCOUNTER — Encounter (HOSPITAL_BASED_OUTPATIENT_CLINIC_OR_DEPARTMENT_OTHER): Payer: Self-pay | Admitting: Internal Medicine

## 2021-05-09 ENCOUNTER — Other Ambulatory Visit: Payer: Self-pay

## 2021-05-09 ENCOUNTER — Ambulatory Visit (HOSPITAL_BASED_OUTPATIENT_CLINIC_OR_DEPARTMENT_OTHER): Payer: BC Managed Care – PPO | Admitting: Internal Medicine

## 2021-05-09 ENCOUNTER — Telehealth (HOSPITAL_BASED_OUTPATIENT_CLINIC_OR_DEPARTMENT_OTHER): Payer: Self-pay | Admitting: Internal Medicine

## 2021-05-09 VITALS — BP 120/78 | HR 87 | Ht 72.5 in | Wt 244.8 lb

## 2021-05-09 DIAGNOSIS — E785 Hyperlipidemia, unspecified: Secondary | ICD-10-CM | POA: Diagnosis not present

## 2021-05-09 DIAGNOSIS — Z8249 Family history of ischemic heart disease and other diseases of the circulatory system: Secondary | ICD-10-CM

## 2021-05-09 DIAGNOSIS — I10 Essential (primary) hypertension: Secondary | ICD-10-CM

## 2021-05-09 DIAGNOSIS — E1169 Type 2 diabetes mellitus with other specified complication: Secondary | ICD-10-CM | POA: Diagnosis not present

## 2021-05-09 NOTE — Telephone Encounter (Signed)
Left message for patient regarding the 05/17/21 11:00 am Calcium scoring appointment here at [facility]--radiology located on the ground floor--f--arrival time is 10:45 am for check in---requested patient call with questions or concerns.l

## 2021-05-09 NOTE — Patient Instructions (Addendum)
Medication Instructions:  STOP niacin   *If you need a refill on your cardiac medications before your next appointment, please call your pharmacy*   Lab Work: FASTING lab work in 3-4 months to check cholesterol (NMR lipoprofile and apolipoprotein B)  -- complete about 1 week before your next visit with Dr. Debara Pickett  If you have labs (blood work) drawn today and your tests are completely normal, you will receive your results only by: [place] (if you have MyChart) OR A paper copy in the mail If you have any lab test that is abnormal or we need to change your treatment, we will call you to review the results.   Testing/Procedures: Dr. Debara Pickett has ordered a CT coronary calcium score. This test is done at [facility]. This is $99 out of pocket.   Coronary CalciumScan A coronary calcium scan is an imaging test used to look for deposits of calcium and other fatty materials (plaques) in the inner lining of the blood vessels of the heart (coronary arteries). These deposits of calcium and plaques can partly clog and narrow the coronary arteries without producing any symptoms or warning signs. This puts a person at risk for a heart attack. This test can detect these deposits before symptoms develop. Tell a health care provider about: Any allergies you have. All medicines you are taking, including vitamins, herbs, eye drops, creams, and over-the-counter medicines. Any problems you or family members have had with anesthetic medicines. Any blood disorders you have. Any surgeries you have had. Any medical conditions you have. Whether you are pregnant or may be pregnant. What are the risks? Generally, this is a safe procedure. However, problems may occur, including: Harm to a pregnant woman and her unborn baby. This test involves the use of radiation. Radiation exposure can be dangerous to a pregnant woman and her unborn baby. If you are pregnant, you generally should not  have this procedure done. Slight increase in the risk of cancer. This is because of the radiation involved in the test. What happens before the procedure? No preparation is needed for this procedure. What happens during the procedure? You will undress and remove any jewelry around your neck or chest. You will put on a hospital gown. Sticky electrodes will be placed on your chest. The electrodes will be connected to an electrocardiogram (ECG) machine to record a tracing of the electrical activity of your heart. A CT scanner will take pictures of your heart. During this time, you will be asked to lie still and hold your breath for 2-3 seconds while a picture of your heart is being taken. The procedure may vary among health care providers and hospitals. What happens after the procedure? You can get dressed. You can return to your normal activities. It is up to you to get the results of your test. Ask your health care provider, or the department that is doing the test, when your results will be ready. Summary A coronary calcium scan is an imaging test used to look for deposits of calcium and other fatty materials (plaques) in the inner lining of the blood vessels of the heart (coronary arteries). Generally, this is a safe procedure. Tell your health care provider if you are pregnant or may be pregnant. No preparation is needed for this procedure. A CT scanner will take pictures of your heart. You can return to your normal activities after the scan is done. This information is not intended to replace advice given to you by  your health care provider. Make sure you discuss any questions you have with your health care provider. Document Released: 04/19/2008 Document Revised: 09/10/2016 Document Reviewed: 09/10/2016 Elsevier Interactive Patient Education  2017 [place]: At [facility], you and your health needs are our priority.  As part of our continuing mission to provide  you with exceptional heart care, we have created designated Provider Care Teams.  These Care Teams include your primary Cardiologist (physician) and Advanced Practice Providers (APPs -  Physician Assistants and Nurse Practitioners) who all work together to provide you with the care you need, when you need it.  We recommend signing up for the patient portal called "MyChart".  Sign up information is provided on this After Visit Summary.  MyChart is used to connect with patients for Virtual Visits (Telemedicine).  Patients are able to view lab/test results, encounter notes, upcoming appointments, etc.  Non-urgent messages can be sent to your provider as well.   To learn more about what you can do with MyChart, go to [email or web address].    Your next appointment:   3-4 month(s) - lipid clinic  The format for your next appointment:   In Person  Provider:   K. Mali Hilty, MD   Other Instructions

## 2021-05-09 NOTE — Progress Notes (Signed)
LIPID CLINIC CONSULT NOTE  Chief Complaint:  High triglycerides  Primary Care Physician: Horald Pollen, MD  Primary Cardiologist:  None  HPI:  Gordon Green is a 60 y.o. male who is being seen today for the evaluation of high triglyceride at the request of Horald Pollen, *.  This is a pleasant 60 year old male kindly referred by Dr. Mitchel Honour, for evaluation management of high triglycerides.  He recently established with his new primary care provider but has a longstanding history of high triglycerides he says dating back to at least his 29s.  He also has type 2 diabetes which appears well managed recent A1c of 5.8, hypertension and dyslipidemia.  He does report family history of heart disease including his father who had unfortunately died of an MI at age 21 and had high cholesterol.  He had previously been on fenofibrate and prior to that simvastatin Mediport apparently the simvastatin did not lower his cholesterol as well as expected.  He had also been prescribed Vascepa but was only taking 2 g twice daily.  He also made dietary changes and had increased saturated fats in his triglycerides more recently everywhere 2022 showed triglycerides of 1021.  He then had increased his Vascepa to 2 g twice daily, changed his diet and was more active and triglycerides have come down significantly to 514, however his LDL cholesterol is elevated at 108.  He is not on a statin.  He does have a history of kidney cancer and is a survivor of this.  He is never had a stroke or MI that he is aware of.  Has not had any prior cardiac testing.  He denies any symptoms such as chest pain or worsening shortness of breath.  PMHx:  Past Medical History:  Diagnosis Date   Cancer ([place]) 02/2004   ;Hx: of right kidney cancer; kidney removed in 2005   Diabetes mellitus without complication ([place])    GERD (gastroesophageal reflux disease)    Gout    Hx: of   Hyperlipidemia    Hypertension      Past Surgical History:  Procedure Laterality Date   FRACTURE SURGERY N/A    Phreesia 12/21/2020   Fx: tibula     Plateau fx:   HERNIA REPAIR N/A    Phreesia 12/21/2020   INGUINAL HERNIA REPAIR     age 76   KIDNEY SURGERY  02/2004   Hx; of removal of right kidnety in 2005   ORIF TIBIA PLATEAU Left 08/25/2013   Procedure: OPEN REDUCTION INTERNAL FIXATION (ORIF) TIBIAL PLATEAU;  Surgeon: Renette Butters, MD;  Location: [facility];  Service: Orthopedics;  Laterality: Left;    FAMHx:  Family History  Problem Relation Age of Onset   Heart disease Father    Hyperlipidemia Brother    Colon cancer Neg Hx    Esophageal cancer Neg Hx    Prostate cancer Neg Hx    Rectal cancer Neg Hx    Stomach cancer Neg Hx    Diabetes Neg Hx     SOCHx:   reports that he quit smoking about 17 years ago. His smoking use included cigarettes. He has never used smokeless tobacco. He reports current alcohol use of about 4.0 standard drinks of alcohol per week. He reports that he does not use drugs.  ALLERGIES:  No Known Allergies  ROS: Pertinent items noted in HPI and remainder of comprehensive ROS otherwise negative.  HOME MEDS: Current Outpatient Medications on File Prior to Visit  Medication Sig Dispense Refill   acetaminophen (TYLENOL) 500 MG tablet Take 500 mg by mouth every 6 (six) hours as needed for mild pain, fever or headache.     allopurinol (ZYLOPRIM) 300 MG tablet TAKE 1 TABLET BY MOUTH EVERY DAY 30 tablet 0   Ascorbic Acid (VITAMIN C) 1000 MG tablet Take 1,000 mg by mouth daily.     blood glucose meter kit and supplies Please check your blood pressure twice a day and keep a log. 1 each 0   Cholecalciferol (VITAMIN D3) 125 MCG (5000 UT) CAPS Take 1 capsule by mouth daily.     CONTOUR NEXT TEST test strip USE AS INSTRUCTED TO TEST BLOOD SUGARS 2 TIMES DAILY. DX CODE 100 each 3   CONTOUR NEXT TEST test strip USE AS INSTRUCTED TO TEST BLOOD SUGARS 1 TIME DAILY 100 strip 3   FARXIGA 10 MG  TABS tablet Take 10 mg by mouth daily.     fenofibrate micronized (LOFIBRA) 134 MG capsule TAKE 1 CAPSULE BY MOUTH EVERY DAY 90 capsule 1   icosapent Ethyl (VASCEPA) 1 g capsule TAKE 2 CAPSULES BY MOUTH 2 TIMES DAILY. 120 capsule 3   Lancets MISC 1 each by Does not apply route 2 (two) times daily. To be used with Contour glucose meter. Dx: E11.8 200 each 0   lisinopril (ZESTRIL) 10 MG tablet Take 10 mg by mouth daily.     Magnesium 500 MG TABS Take 500 mg by mouth daily.     metFORMIN (GLUCOPHAGE) 1000 MG tablet TAKE 1 TABLET (1,000 MG TOTAL) BY MOUTH 2 (TWO) TIMES DAILY WITH A MEAL. 180 tablet 1   omeprazole (PRILOSEC OTC) 20 MG tablet Take 20 mg by mouth daily.     Potassium Gluconate 595 MG CAPS Take 595 mg by mouth daily.     Turmeric (QC TUMERIC COMPLEX PO) Take 1 capsule by mouth as needed.     VITAMIN D PO Take 5,000 Units by mouth daily.     No current facility-administered medications on file prior to visit.    LABS/IMAGING: No results found for this or any previous visit (from the past 48 hour(s)). No results found.  LIPID PANEL:    Component Value Date/Time   CHOL 193 04/25/2021 1018   CHOL 206 (H) 12/22/2020 1352   TRIG (H) 04/25/2021 1018    514.0 Triglyceride is over 400; calculations on Lipids are invalid.   HDL 28.60 (L) 04/25/2021 1018   HDL 21 (L) 12/22/2020 1352   CHOLHDL 7 04/25/2021 1018   VLDL 79.2 (H) 06/23/2020 1010   LDLCALC Comment (A) 12/22/2020 1352   LDLDIRECT 108.0 04/25/2021 1018    WEIGHTS: Wt Readings from Last 3 Encounters:  05/09/21 244 lb 12.8 oz (111 kg)  04/27/21 245 lb 12.8 oz (111.5 kg)  12/22/20 260 lb (117.9 kg)    VITALS: BP 120/78   Pulse 87   Ht 6' 0.5" (1.842 m)   Wt 244 lb 12.8 oz (111 kg)   SpO2 98%   BMI 32.74 kg/m   EXAM: General appearance: alert and no distress Neck: no carotid bruit, no JVD, and thyroid not enlarged, symmetric, no tenderness/mass/nodules Lungs: clear to auscultation bilaterally Heart: regular  rate and rhythm, S1, S2 normal, and systolic murmur: early systolic 2/6, blowing at apex Abdomen: soft, non-tender; bowel sounds normal; no masses,  no organomegaly Extremities: extremities normal, atraumatic, no cyanosis or edema Pulses: 2+ and symmetric Skin: Skin color, texture, turgor normal. 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  Mr. Franchi has high triglycerides but well-controlled diabetes.  He reports that his triglycerides been high since his 21s and suggestive of a triglyceride disorder.  Based on his early onset heart disease in the family, I would highly recommend a calcium score to further risk stratify him.  He is asymptomatic.  He is on good medical therapy for his triglycerides however triglycerides now remain above 500.  He may be a candidate for the core lipid trial.  I will refer him for prescreening.  Has his LDL remains above 100, would likely recommend adding a statin which should give him additional LDL lowering and triglyceride benefit.  He wishes to continue to work on diet further and more activity.  We will repeat lipids in about 3 months.  Follow-up with me at that time.  Thanks for the kind referral.  Pixie Casino, MD, FACC, [place] Director of the Advanced Lipid Disorders &  Cardiovascular Risk Reduction Clinic Diplomate of the American Board of Clinical Lipidology Attending Cardiologist  Direct Dial: [phone]  Fax: [phone]  Website:  [email or web address]  Nadean Corwin Dayna Geurts 05/09/2021, 10:35 AM

## 2021-05-14 ENCOUNTER — Other Ambulatory Visit: Payer: Self-pay | Admitting: Endocrinology

## 2021-05-17 ENCOUNTER — Other Ambulatory Visit: Payer: Self-pay

## 2021-05-17 ENCOUNTER — Ambulatory Visit (HOSPITAL_BASED_OUTPATIENT_CLINIC_OR_DEPARTMENT_OTHER)
Admission: RE | Admit: 2021-05-17 | Discharge: 2021-05-17 | Disposition: A | Discharge status: Home / Self Care | Payer: BC Managed Care – PPO | Source: Ambulatory Visit | Attending: Internal Medicine | Admitting: Internal Medicine

## 2021-05-17 DIAGNOSIS — E1169 Type 2 diabetes mellitus with other specified complication: Secondary | ICD-10-CM | POA: Insufficient documentation

## 2021-05-17 DIAGNOSIS — E785 Hyperlipidemia, unspecified: Secondary | ICD-10-CM | POA: Insufficient documentation

## 2021-05-23 ENCOUNTER — Other Ambulatory Visit: Payer: Self-pay | Admitting: Endocrinology

## 2021-05-31 ENCOUNTER — Ambulatory Visit (HOSPITAL_BASED_OUTPATIENT_CLINIC_OR_DEPARTMENT_OTHER): Payer: BC Managed Care – PPO | Admitting: Internal Medicine

## 2021-05-31 ENCOUNTER — Other Ambulatory Visit: Payer: Self-pay

## 2021-05-31 ENCOUNTER — Encounter (HOSPITAL_BASED_OUTPATIENT_CLINIC_OR_DEPARTMENT_OTHER): Payer: Self-pay | Admitting: Internal Medicine

## 2021-05-31 VITALS — BP 144/80 | HR 86 | Ht 73.0 in | Wt 244.0 lb

## 2021-05-31 DIAGNOSIS — R931 Abnormal findings on diagnostic imaging of heart and coronary circulation: Secondary | ICD-10-CM | POA: Diagnosis not present

## 2021-05-31 DIAGNOSIS — Z8249 Family history of ischemic heart disease and other diseases of the circulatory system: Secondary | ICD-10-CM

## 2021-05-31 DIAGNOSIS — E785 Hyperlipidemia, unspecified: Secondary | ICD-10-CM

## 2021-05-31 DIAGNOSIS — E1169 Type 2 diabetes mellitus with other specified complication: Secondary | ICD-10-CM

## 2021-05-31 DIAGNOSIS — I10 Essential (primary) hypertension: Secondary | ICD-10-CM | POA: Diagnosis not present

## 2021-05-31 MED ORDER — ATORVASTATIN CALCIUM 40 MG PO TABS: 40.0000 mg | ORAL_TABLET | Freq: Every day | ORAL | 3 refills | Status: DC

## 2021-05-31 NOTE — Progress Notes (Signed)
LIPID CLINIC CONSULT NOTE  Chief Complaint:  High triglycerides  Primary Care Physician: Horald Pollen, MD  Primary Cardiologist:  None  HPI:  Gordon Green is a 60 y.o. male who is being seen today for the evaluation of high triglyceride at the request of Horald Pollen, *.  This is a pleasant 60 year old male kindly referred by Dr. Mitchel Honour, for evaluation management of high triglycerides.  He recently established with his new primary care provider but has a longstanding history of high triglycerides he says dating back to at least his 21s.  He also has type 2 diabetes which appears well managed recent A1c of 5.8, hypertension and dyslipidemia.  He does report family history of heart disease including his father who had unfortunately died of an MI at age 68 and had high cholesterol.  He had previously been on fenofibrate and prior to that simvastatin Mediport apparently the simvastatin did not lower his cholesterol as well as expected.  He had also been prescribed Vascepa but was only taking 2 g twice daily.  He also made dietary changes and had increased saturated fats in his triglycerides more recently everywhere 2022 showed triglycerides of 1021.  He then had increased his Vascepa to 2 g twice daily, changed his diet and was more active and triglycerides have come down significantly to 514, however his LDL cholesterol is elevated at 108.  He is not on a statin.  He does have a history of kidney cancer and is a survivor of this.  He is never had a stroke or MI that he is aware of.  Has not had any prior cardiac testing.  He denies any symptoms such as chest pain or worsening shortness of breath.  05/31/2021  Mr. Rewerts returns today for follow-up.  He underwent recent calcium scoring which surprisingly showed an abnormally high calcium score of 2833.  This demonstrated multivessel coronary calcium which somewhat spared the left circumflex otherwise a significant  finding.  He reports only about 3 years of diabetes therefore I think it is unlikely related to that.  Suspect there is family history of strong early onset heart disease in his father as well as dyslipidemia with high triglycerides are major risk factors.  We had a discussion today about possible stress testing however he said he did significant hike this past weekend for several hours [facility] and had no chest pain or significant shortness of breath.  He exercises regularly and says he is asymptomatic.  PMHx:  Past Medical History:  Diagnosis Date   Cancer ([place]) 02/2004   ;Hx: of right kidney cancer; kidney removed in 2005   Diabetes mellitus without complication ([place])    GERD (gastroesophageal reflux disease)    Gout    Hx: of   Hyperlipidemia    Hypertension     Past Surgical History:  Procedure Laterality Date   FRACTURE SURGERY N/A    Phreesia 12/21/2020   Fx: tibula     Plateau fx:   HERNIA REPAIR N/A    Phreesia 12/21/2020   INGUINAL HERNIA REPAIR     age 70   KIDNEY SURGERY  02/2004   Hx; of removal of right kidnety in 2005   ORIF TIBIA PLATEAU Left 08/25/2013   Procedure: OPEN REDUCTION INTERNAL FIXATION (ORIF) TIBIAL PLATEAU;  Surgeon: Renette Butters, MD;  Location: [facility];  Service: Orthopedics;  Laterality: Left;    FAMHx:  Family History  Problem Relation Age of Onset  Heart disease Father    Hyperlipidemia Brother    Colon cancer Neg Hx    Esophageal cancer Neg Hx    Prostate cancer Neg Hx    Rectal cancer Neg Hx    Stomach cancer Neg Hx    Diabetes Neg Hx     SOCHx:   reports that he quit smoking about 17 years ago. His smoking use included cigarettes. He has never used smokeless tobacco. He reports current alcohol use of about 4.0 standard drinks of alcohol per week. He reports that he does not use drugs.  ALLERGIES:  No Known Allergies  ROS: Pertinent items noted in HPI and remainder of comprehensive ROS otherwise  negative.  HOME MEDS: Current Outpatient Medications on File Prior to Visit  Medication Sig Dispense Refill   acetaminophen (TYLENOL) 500 MG tablet Take 500 mg by mouth every 6 (six) hours as needed for mild pain, fever or headache.     allopurinol (ZYLOPRIM) 300 MG tablet TAKE 1 TABLET BY MOUTH EVERY DAY 30 tablet 1   Ascorbic Acid (VITAMIN C) 1000 MG tablet Take 1,000 mg by mouth daily.     blood glucose meter kit and supplies Please check your blood pressure twice a day and keep a log. 1 each 0   Cholecalciferol (VITAMIN D3) 125 MCG (5000 UT) CAPS Take 1 capsule by mouth daily.     CONTOUR NEXT TEST test strip USE AS INSTRUCTED TO TEST BLOOD SUGARS 2 TIMES DAILY. DX CODE 100 each 3   CONTOUR NEXT TEST test strip USE AS INSTRUCTED TO TEST BLOOD SUGARS 1 TIME DAILY 100 strip 3   FARXIGA 10 MG TABS tablet Take 10 mg by mouth daily.     fenofibrate micronized (LOFIBRA) 134 MG capsule TAKE 1 CAPSULE BY MOUTH EVERY DAY 90 capsule 1   Lancets MISC 1 each by Does not apply route 2 (two) times daily. To be used with Contour glucose meter. Dx: E11.8 200 each 0   lisinopril (ZESTRIL) 10 MG tablet Take 10 mg by mouth daily.     Magnesium 500 MG TABS Take 500 mg by mouth daily.     metFORMIN (GLUCOPHAGE) 1000 MG tablet TAKE 1 TABLET (1,000 MG TOTAL) BY MOUTH 2 (TWO) TIMES DAILY WITH A MEAL. 180 tablet 1   omeprazole (PRILOSEC OTC) 20 MG tablet Take 20 mg by mouth daily.     Potassium Gluconate 595 MG CAPS Take 595 mg by mouth daily.     Turmeric (QC TUMERIC COMPLEX PO) Take 1 capsule by mouth as needed.     VASCEPA 1 g capsule TAKE 2 CAPSULES BY MOUTH TWICE A DAY 120 capsule 3   VITAMIN D PO Take 5,000 Units by mouth daily.     No current facility-administered medications on file prior to visit.    LABS/IMAGING: No results found for this or any previous visit (from the past 48 hour(s)). No results found.  LIPID PANEL:    Component Value Date/Time   CHOL 193 04/25/2021 1018   CHOL 206 (H)  12/22/2020 1352   TRIG (H) 04/25/2021 1018    514.0 Triglyceride is over 400; calculations on Lipids are invalid.   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  Mr. Stmarie has a very high calcium score of 2833, 99th percentile for age and sex matched controls.  Based on this would recommend adding atorvastatin 40 mg daily to his current regimen and low-dose aspirin 81 mg daily for coronary disease.  He vehemently denies any symptoms with exercise and says he is very physically active.  I advised him to consider purchasing a heart rate monitor to monitor heart rate recovery after exercise.  We will plan repeat lipids as scheduled in late October with follow-up with me in November.  He may ultimately still need referral for a research trial.  He is advised to contact me if he should develop any symptoms with exercise.  Pixie Casino, MD, [facility], [place] Director of the Advanced Lipid Disorders &  Cardiovascular Risk Reduction Clinic Diplomate of the American Board of Clinical Lipidology Attending Cardiologist  Direct Dial: [phone]  Fax: [phone]  Website:  [email or web address] 05/31/2021, 9:44 AM

## 2021-05-31 NOTE — Patient Instructions (Addendum)
Medication Instructions:  START aspirin '81mg'$  daily START atorvastatin '40mg'$  daily at night   *If you need a refill on your cardiac medications before your next appointment, please call your pharmacy*   Lab Work: FASTING lab work a few days before your next visit with Dr. Debara Pickett   If you have labs (blood work) drawn today and your tests are completely normal, you will receive your results only by: [facility] (if you have MyChart) OR A paper copy in the mail If you have any lab test that is abnormal or we need to change your treatment, we will call you to review the results.   Testing/Procedures: NONE   Follow-Up: At [facility], you and your health needs are our priority.  As part of our continuing mission to provide you with exceptional heart care, we have created designated Provider Care Teams.  These Care Teams include your primary Cardiologist (physician) and Advanced Practice Providers (APPs -  Physician Assistants and Nurse Practitioners) who all work together to provide you with the care you need, when you need it.  We recommend signing up for the patient portal called "MyChart".  Sign up information is provided on this After Visit Summary.  MyChart is used to connect with patients for Virtual Visits (Telemedicine).  Patients are able to view lab/test results, encounter notes, upcoming appointments, etc.  Non-urgent messages can be sent to your provider as well.   To learn more about what you can do with MyChart, go to [email or web address].    Your next appointment:   November 4 @ 10:15 am [facility]  [address] [place],  [id number]

## 2021-06-15 ENCOUNTER — Other Ambulatory Visit: Payer: Self-pay | Admitting: Endocrinology

## 2021-06-20 DIAGNOSIS — N182 Chronic kidney disease, stage 2 (mild): Secondary | ICD-10-CM | POA: Diagnosis not present

## 2021-06-22 ENCOUNTER — Ambulatory Visit: Payer: Self-pay | Admitting: Emergency Medicine

## 2021-06-27 ENCOUNTER — Other Ambulatory Visit: Payer: Self-pay

## 2021-06-27 ENCOUNTER — Other Ambulatory Visit (INDEPENDENT_AMBULATORY_CARE_PROVIDER_SITE_OTHER): Payer: BC Managed Care – PPO

## 2021-06-27 DIAGNOSIS — E782 Mixed hyperlipidemia: Secondary | ICD-10-CM | POA: Diagnosis not present

## 2021-06-27 LAB — COMPREHENSIVE METABOLIC PANEL
ALT: 56 U/L — ABNORMAL HIGH (ref 0–53)
AST: 36 U/L (ref 0–37)
Albumin: 4.5 g/dL (ref 3.5–5.2)
Alkaline Phosphatase: 57 U/L (ref 39–117)
BUN: 28 mg/dL — ABNORMAL HIGH (ref 6–23)
CO2: 27 mEq/L (ref 19–32)
Calcium: 10.2 mg/dL (ref 8.4–10.5)
Chloride: 102 mEq/L (ref 96–112)
Creatinine, Ser: 1.34 mg/dL (ref 0.40–1.50)
GFR: 58.05 mL/min — ABNORMAL LOW (ref >60.00)
Glucose, Bld: 87 mg/dL (ref 70–99)
Potassium: 4.8 mEq/L (ref 3.5–5.1)
Sodium: 137 mEq/L (ref 135–145)
Total Bilirubin: 0.5 mg/dL (ref 0.2–1.2)
Total Protein: 7.6 g/dL (ref 6.0–8.3)

## 2021-06-27 LAB — LIPID PANEL
Cholesterol: 127 mg/dL (ref 0–200)
HDL: 25.2 mg/dL — ABNORMAL LOW (ref >39.00)
NonHDL: 101.31
Total CHOL/HDL Ratio: 5
Triglycerides: 352 mg/dL — ABNORMAL HIGH (ref 0.0–149.0)
VLDL: 70.4 mg/dL — ABNORMAL HIGH (ref 0.0–40.0)

## 2021-06-27 LAB — LDL CHOLESTEROL, DIRECT: Direct LDL: 71 mg/dL

## 2021-06-28 ENCOUNTER — Encounter: Payer: Self-pay | Admitting: Emergency Medicine

## 2021-06-28 ENCOUNTER — Ambulatory Visit: Payer: BC Managed Care – PPO | Admitting: Emergency Medicine

## 2021-06-28 VITALS — BP 137/72 | HR 87 | Temp 98.8°F | Ht 73.0 in | Wt 247.0 lb

## 2021-06-28 DIAGNOSIS — E1169 Type 2 diabetes mellitus with other specified complication: Secondary | ICD-10-CM | POA: Diagnosis not present

## 2021-06-28 DIAGNOSIS — E785 Hyperlipidemia, unspecified: Secondary | ICD-10-CM

## 2021-06-28 DIAGNOSIS — I152 Hypertension secondary to endocrine disorders: Secondary | ICD-10-CM | POA: Diagnosis not present

## 2021-06-28 DIAGNOSIS — E1159 Type 2 diabetes mellitus with other circulatory complications: Secondary | ICD-10-CM

## 2021-06-28 NOTE — Assessment & Plan Note (Signed)
Well-controlled hypertension.  Continue lisinopril 20 mg daily. Well-controlled diabetes with hemoglobin A1c of 5.8.  Continue metformin and Farxiga. Cardiologist and endocrinologist most recent office notes reviewed with patient.

## 2021-06-28 NOTE — Patient Instructions (Signed)

## 2021-06-28 NOTE — Progress Notes (Signed)
Gordon Green 60 y.o.   Chief Complaint  Patient presents with   Hypertension   Last office visit assessment and plan as follows: Hypertension associated with diabetes ([place]) Well-controlled hypertension.  Continue present medications. Hemoglobin A1c at 6.9.  Higher than before.  Has not been taking Metformin twice a day every day.  Noncompliant with diet. Start taking Metformin 1000 mg twice a day every day.  Diet and nutrition discussed. Follow-up in 6 months. HISTORY OF PRESENT ILLNESS: This is a 60 y.o. male with history of hypertension here for follow-up.  Presently taking lisinopril 20 mg daily.  Doing well with average blood pressure at home of 137/72. Diabetic on metformin and Farxiga.  Sees Dr. Dwyane Dee on a regular basis. Lab Results  Component Value Date   HGBA1C 5.8 04/25/2021    Recent cardiologist office visit with Dr. 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  Gordon Green has a very high calcium score of 2833, 99th percentile for age and sex matched controls.  Based on this would recommend adding atorvastatin 40 mg daily to his current regimen and low-dose aspirin 81 mg daily for coronary disease.  He vehemently denies any symptoms with exercise and says he is very physically active.  I advised him to consider purchasing a heart rate monitor to monitor heart rate recovery after exercise.  We will plan repeat lipids as scheduled in late October with follow-up with me in November.  He may ultimately still need referral for a research trial.  He is advised to contact me if he should develop any symptoms with exercise.   Gordon Casino, MD, [facility] [place] Director of the [facility]  Cardiovascular [facility]  [facility] Diplomate of the American Board of Clinical Lipidology Attending Cardiologist  Direct Dial: [phone]  Fax: [phone]  Website:  [email or web address]  Also seeing cardiologist, Dr. Dwyane Dee.  Last office assessment and plan 05/01/2021 as follows: ASSESSMENT:   HYPERTRIGLYCERIDEMIA: He has had persistent hypertriglyceridemia This is still not controlled with triglycerides over 500 but labs were done nonfasting He appears to have a relatively high fat diet   He is not taking the prescription Niaspan in addition to Vascepa and fenofibrate and recommended because of cost     Diabetes type 2 with obesity, on metformin   See history of present illness for discussion of current diabetes management, blood sugar patterns and problems identified   His A1c is excellent at 5.8, now on metformin and Farxiga   Although Wilder Glade was started by nephrologist he is significantly benefiting with weight loss and better blood sugar control He has cut back on carbohydrates which is likely helping also   Hypertension: Followed by nephrologist     PLAN:   Start back on high fat foods, was given list of high saturated fat foods to cut back on He can incorporate some whole-grain carbohydrates   Trial of Slo-Niacin OTC 500 mg at dinnertime or bedtime for 2 weeks and then 1000 mg  If he has a difficulty with flushing he can take 2 tablets of coated 81 mg aspirin prior to this No change in metformin or Farxiga   Will await labs from nephrologist for last visit but will check microalbumin on next visit also   Check blood pressure regularly  at home   There are no Patient Instructions on file for this visit.         Gordon Green 04/27/2021, 9:30 AM  Hypertension Pertinent negatives include no chest pain, headaches, palpitations or shortness of breath.    Prior to Admission medications   Medication Sig Start Date End Date Taking? Authorizing Provider  acetaminophen (TYLENOL) 500 MG  tablet Take 500 mg by mouth every 6 (six) hours as needed for mild pain, fever or headache.   Yes [provider]  allopurinol (ZYLOPRIM) 300 MG tablet TAKE 1 TABLET BY MOUTH EVERY DAY 06/16/21  Yes Gordon Snare, MD  Ascorbic Acid (VITAMIN C) 1000 MG tablet Take 1,000 mg by mouth daily.   Yes [provider]  aspirin EC 81 MG tablet Take 81 mg by mouth daily. Swallow whole.   Yes [provider]  atorvastatin (LIPITOR) 40 MG tablet Take 1 tablet (40 mg total) by mouth daily. 05/31/21 08/29/21 Yes Hilty, Nadean Corwin, MD  blood glucose meter kit and supplies Please check your blood pressure twice a day and keep a log. 11/13/17  Yes Jola Schmidt, MD  Cholecalciferol (VITAMIN D3) 125 MCG (5000 UT) CAPS Take 1 capsule by mouth daily.   Yes [provider]  CONTOUR NEXT TEST test strip USE AS INSTRUCTED TO TEST BLOOD SUGARS 2 TIMES DAILY. DX CODE 04/28/18  Yes Gordon Snare, MD  CONTOUR NEXT TEST test strip USE AS INSTRUCTED TO TEST BLOOD SUGARS 1 TIME DAILY 02/18/21  Yes Gordon Snare, MD  FARXIGA 10 MG TABS tablet Take 10 mg by mouth daily. 04/07/21  Yes [provider]  fenofibrate micronized (LOFIBRA) 134 MG capsule TAKE 1 CAPSULE BY MOUTH EVERY DAY 03/13/21  Yes Gordon Snare, MD  Lancets MISC 1 each by Does not apply route 2 (two) times daily. To be used with Contour glucose meter. Dx: E11.8 12/31/17  Yes Gordon Snare, MD  lisinopril (ZESTRIL) 10 MG tablet Take 10 mg by mouth daily. 08/05/19  Yes [provider]  Magnesium 500 MG TABS Take 500 mg by mouth daily.   Yes [provider]  Menaquinone-7 (VITAMIN K2 PO) Take by mouth.   Yes [provider]  metFORMIN (GLUCOPHAGE) 1000 MG tablet TAKE 1 TABLET (1,000 MG TOTAL) BY MOUTH 2 (TWO) TIMES DAILY WITH A MEAL. 04/12/21  Yes Zyair Rhein, Ines Bloomer, MD  omeprazole (PRILOSEC OTC) 20 MG tablet Take 20 mg by mouth daily.   Yes [provider]  Potassium Gluconate 595 MG CAPS Take 595 mg by mouth  daily.   Yes [provider]  Turmeric (QC TUMERIC COMPLEX PO) Take 1 capsule by mouth as needed.   Yes [provider]  VASCEPA 1 g capsule TAKE 2 CAPSULES BY MOUTH TWICE A DAY 05/23/21  Yes Gordon Snare, MD  VITAMIN D PO Take 5,000 Units by mouth daily.   Yes [provider]    No Known Allergies  Patient Active Problem List   Diagnosis Date Noted   Hypertension associated with diabetes ([place]) 12/22/2020   Mixed hyperlipidemia 08/08/2018   History of gout 08/08/2018   Dyslipidemia associated with type 2 diabetes mellitus ([place]) 12/13/2017   Solitary left kidney 08/27/2016   Renal cancer ([place]) 04/24/2015    Past Medical History:  Diagnosis Date   Cancer ([place]) 02/2004   ;Hx: of right kidney cancer; kidney removed in 2005   Diabetes mellitus without complication ([place])    GERD (gastroesophageal reflux disease)    Gout  Hx: of   Hyperlipidemia    Hypertension     Past Surgical History:  Procedure Laterality Date   FRACTURE SURGERY N/A    Phreesia 12/21/2020   Fx: tibula     Plateau fx:   HERNIA REPAIR N/A    [employer]reesia 12/21/2020   INGUINAL HERNIA REPAIR     age 89   KIDNEY SURGERY  02/2004   Hx; of removal of right kidnety in 2005   ORIF TIBIA PLATEAU Left 08/25/2013   Procedure: OPEN REDUCTION INTERNAL FIXATION (ORIF) TIBIAL PLATEAU;  Surgeon: Renette Butters, MD;  Location: [facility];  Service: Orthopedics;  Laterality: Left;    Social History   Socioeconomic History   Marital status: Married    Spouse name: Not on file   Number of children: Not on file   Years of education: Not on file   Highest education level: Not on file  Occupational History   Not on file  Tobacco Use   Smoking status: Former    Types: Cigarettes    Quit date: 03/24/2004    Years since quitting: 17.2   Smokeless tobacco: Never   Tobacco comments:    quit smoking cigarettes in 2005  Substance and Sexual Activity   Alcohol use: Yes    Alcohol/week: 4.0 standard  drinks    Types: 2 Cans of beer, 2 Standard drinks or equivalent per week   Drug use: No   Sexual activity: Not on file  Other Topics Concern   Not on file  Social History Narrative   Not on file   Social Determinants of Health   Financial Resource Strain: Not on file  Food Insecurity: Not on file  Transportation Needs: Not on file  Physical Activity: Not on file  Stress: Not on file  Social Connections: Not on file  Intimate Partner Violence: Not on file    Family History  Problem Relation Age of Onset   Heart disease Father    Hyperlipidemia Brother    Colon cancer Neg Hx    Esophageal cancer Neg Hx    Prostate cancer Neg Hx    Rectal cancer Neg Hx    Stomach cancer Neg Hx    Diabetes Neg Hx      Review of Systems  Constitutional: Negative.  Negative for chills and fever.  HENT: Negative.  Negative for congestion and sore throat.   Respiratory: Negative.  Negative for cough and shortness of breath.   Cardiovascular: Negative.  Negative for chest pain and palpitations.  Gastrointestinal:  Negative for abdominal pain, blood in stool, diarrhea, melena, nausea and vomiting.  Genitourinary: Negative.   Skin: Negative.  Negative for rash.  Neurological:  Negative for dizziness and headaches.  All other systems reviewed and are negative.  Today's Vitals   06/28/21 1009  BP: (!) 146/86  Pulse: 87  Temp: 98.8 F (37.1 C)  TempSrc: Oral  SpO2: 97%  Weight: 247 lb (112 kg)  Height: 6' 1"  (1.854 m)   Body mass index is 32.59 kg/m. Wt Readings from Last 3 Encounters:  06/28/21 247 lb (112 kg)  05/31/21 244 lb (110.7 kg)  05/09/21 244 lb 12.8 oz (111 kg)    Physical Exam Vitals reviewed.  Constitutional:      Appearance: Normal appearance.  HENT:     Head: Normocephalic.  Eyes:     Extraocular Movements: Extraocular movements intact.     Pupils: Pupils are equal, round, and reactive to light.  Neck:  Vascular: No carotid bruit.  Cardiovascular:      Rate and Rhythm: Normal rate and regular rhythm.     Pulses: Normal pulses.     Heart sounds: Normal heart sounds.  Pulmonary:     Effort: Pulmonary effort is normal.     Breath sounds: Normal breath sounds.  Musculoskeletal:        General: Normal range of motion.     Cervical back: Normal range of motion and neck supple.  Lymphadenopathy:     Cervical: No cervical adenopathy.  Skin:    General: Skin is warm and dry.     Capillary Refill: Capillary refill takes less than 2 seconds.  Neurological:     General: No focal deficit present.     Mental Status: He is alert and oriented to person, place, and time.  Psychiatric:        Mood and Affect: Mood normal.        Behavior: Behavior normal.     ASSESSMENT & PLAN: Hypertension associated with diabetes ([place]) Well-controlled hypertension.  Continue lisinopril 20 mg daily. Well-controlled diabetes with hemoglobin A1c of 5.8.  Continue metformin and Farxiga. Cardiologist and endocrinologist most recent office notes reviewed with patient.  Dyslipidemia associated with type 2 diabetes mellitus ([place]) Diet and nutrition discussed.  Continue atorvastatin 40 mg daily, Vascepa, and fenofibrate.  Gordon Green was seen today for hypertension.  Diagnoses and all orders for this visit:  Hypertension associated with diabetes ([place]) -     Cancel: POCT glycosylated hemoglobin (Hb A1C)  Dyslipidemia associated with type 2 diabetes mellitus ([place])   Patient Instructions  Hypertension, Adult High blood pressure (hypertension) is when the force of blood pumping through the arteries is too strong. The arteries are the blood vessels that carry blood from the heart throughout the body. Hypertension forces the heart to work harder to pump blood and may cause arteries to become narrow or stiff. Untreated or uncontrolled hypertension can cause a heart attack, heart failure, a stroke, kidney disease, and otherproblems. A blood pressure reading consists of a  higher number over a lower number. Ideally, your blood pressure should be below 120/80. The first ("top") number is called the systolic pressure. It is a measure of the pressure in your arteries as your heart beats. The second ("bottom") number is called the diastolic pressure. It is a measure of the pressure in your arteries as theheart relaxes. What are the causes? The exact cause of this condition is not known. There are some conditions thatresult in or are related to high blood pressure. What increases the risk? Some risk factors for high blood pressure are under your control. The following factors may make you more likely to develop this condition: Smoking. Having type 2 diabetes mellitus, high cholesterol, or both. Not getting enough exercise or physical activity. Being overweight. Having too much fat, sugar, calories, or salt (sodium) in your diet. Drinking too much alcohol. Some risk factors for high blood pressure may be difficult or impossible to change. Some of these factors include: Having chronic kidney disease. Having a family history of high blood pressure. Age. Risk increases with age. Race. You may be at higher risk if you are African American. Gender. Men are at higher risk than women before age 61. After age 8, women are at higher risk than men. Having obstructive sleep apnea. Stress. What are the signs or symptoms? High blood pressure may not cause symptoms. Very high blood pressure (hypertensive crisis) may cause: Headache. Anxiety. Shortness  of breath. Nosebleed. Nausea and vomiting. Vision changes. Severe chest pain. Seizures. How is this diagnosed? This condition is diagnosed by measuring your blood pressure while you are seated, with your arm resting on a flat surface, your legs uncrossed, and your feet flat on the floor. The cuff of the blood pressure monitor will be placed directly against the skin of your upper arm at the level of your heart. It should be  measured at least twice using the same arm. Certain conditions cancause a difference in blood pressure between your right and left arms. Certain factors can cause blood pressure readings to be lower or higher than normal for a short period of time: When your blood pressure is higher when you are in a health care provider's office than when you are at home, this is called white coat hypertension. Most people with this condition do not need medicines. When your blood pressure is higher at home than when you are in a health care provider's office, this is called masked hypertension. Most people with this condition may need medicines to control blood pressure. If you have a high blood pressure reading during one visit or you have normal blood pressure with other risk factors, you may be asked to: Return on a different day to have your blood pressure checked again. Monitor your blood pressure at home for 1 week or longer. If you are diagnosed with hypertension, you may have other blood or imaging tests to help your health care provider understand your overall risk for otherconditions. How is this treated? This condition is treated by making healthy lifestyle changes, such as eating healthy foods, exercising more, and reducing your alcohol intake. Your health care provider may prescribe medicine if lifestyle changes are not enough to get your blood pressure under control, and if: Your systolic blood pressure is above 130. Your diastolic blood pressure is above 80. Your personal target blood pressure may vary depending on your medicalconditions, your age, and other factors. Follow these instructions at home: Eating and drinking  Eat a diet that is high in fiber and potassium, and low in sodium, added sugar, and fat. An example eating plan is called the DASH (Dietary Approaches to Stop Hypertension) diet. To eat this way: Eat plenty of fresh fruits and vegetables. Try to fill one half of your plate at each  meal with fruits and vegetables. Eat whole grains, such as whole-wheat pasta, brown rice, or whole-grain bread. Fill about one fourth of your plate with whole grains. Eat or drink low-fat dairy products, such as skim milk or low-fat yogurt. Avoid fatty cuts of meat, processed or cured meats, and poultry with skin. Fill about one fourth of your plate with lean proteins, such as fish, chicken without skin, beans, eggs, or tofu. Avoid pre-made and processed foods. These tend to be higher in sodium, added sugar, and fat. Reduce your daily sodium intake. Most people with hypertension should eat less than 1,500 mg of sodium a day. Do not drink alcohol if: Your health care provider tells you not to drink. You are pregnant, may be pregnant, or are planning to become pregnant. If you drink alcohol: Limit how much you use to: 0-1 drink a day for women. 0-2 drinks a day for men. Be aware of how much alcohol is in your drink. In the [place] one drink equals one 12 oz bottle of beer (355 mL), one 5 oz glass of wine (148 mL), or one 1 oz glass of hard liquor (44  mL).  Lifestyle  Work with your health care provider to maintain a healthy body weight or to lose weight. Ask what an ideal weight is for you. Get at least 30 minutes of exercise most days of the week. Activities may include walking, swimming, or biking. Include exercise to strengthen your muscles (resistance exercise), such as Pilates or lifting weights, as part of your weekly exercise routine. Try to do these types of exercises for 30 minutes at least 3 days a week. Do not use any products that contain nicotine or tobacco, such as cigarettes, e-cigarettes, and chewing tobacco. If you need help quitting, ask your health care provider. Monitor your blood pressure at home as told by your health care provider. Keep all follow-up visits as told by your health care provider. This is important.  Medicines Take over-the-counter and prescription medicines  only as told by your health care provider. Follow directions carefully. Blood pressure medicines must be taken as prescribed. Do not skip doses of blood pressure medicine. Doing this puts you at risk for problems and can make the medicine less effective. Ask your health care provider about side effects or reactions to medicines that you should watch for. Contact a health care provider if you: Think you are having a reaction to a medicine you are taking. Have headaches that keep coming back (recurring). Feel dizzy. Have swelling in your ankles. Have trouble with your vision. Get help right away if you: Develop a severe headache or confusion. Have unusual weakness or numbness. Feel faint. Have severe pain in your chest or abdomen. Vomit repeatedly. Have trouble breathing. Summary Hypertension is when the force of blood pumping through your arteries is too strong. If this condition is not controlled, it may put you at risk for serious complications. Your personal target blood pressure may vary depending on your medical conditions, your age, and other factors. For most people, a normal blood pressure is less than 120/80. Hypertension is treated with lifestyle changes, medicines, or a combination of both. Lifestyle changes include losing weight, eating a healthy, low-sodium diet, exercising more, and limiting alcohol. This information is not intended to replace advice given to you by your health care provider. Make sure you discuss any questions you have with your healthcare provider. Document Revised: 07/02/2018 Document Reviewed: 07/02/2018 Elsevier Patient Education  2022 [place], MD [facility]

## 2021-06-28 NOTE — Assessment & Plan Note (Signed)
Diet and nutrition discussed.  Continue atorvastatin 40 mg daily, Vascepa, and fenofibrate.

## 2021-06-30 ENCOUNTER — Telehealth (INDEPENDENT_AMBULATORY_CARE_PROVIDER_SITE_OTHER): Payer: BC Managed Care – PPO | Admitting: Endocrinology

## 2021-06-30 ENCOUNTER — Other Ambulatory Visit: Payer: Self-pay

## 2021-06-30 DIAGNOSIS — E1169 Type 2 diabetes mellitus with other specified complication: Secondary | ICD-10-CM | POA: Diagnosis not present

## 2021-06-30 DIAGNOSIS — R809 Proteinuria, unspecified: Secondary | ICD-10-CM | POA: Diagnosis not present

## 2021-06-30 DIAGNOSIS — Z8739 Personal history of other diseases of the musculoskeletal system and connective tissue: Secondary | ICD-10-CM | POA: Diagnosis not present

## 2021-06-30 DIAGNOSIS — E782 Mixed hyperlipidemia: Secondary | ICD-10-CM | POA: Diagnosis not present

## 2021-06-30 DIAGNOSIS — E669 Obesity, unspecified: Secondary | ICD-10-CM

## 2021-06-30 NOTE — Progress Notes (Signed)
Patient ID: Gordon Green, male   DOB: May 04, 1961, 60 y.o.   MRN: [id number]          Reason for Appointment: Follow-up for various problems  I connected with the above-named patient by video enabled telemedicine application and verified that I am speaking with the correct person. The patient was explained the limitations of evaluation and management by telemedicine and the availability of in person appointments.  Patient also understood that there may be a patient responsible charge related to this service  Location of the patient: Patient's home  Location of the provider: Physician office Only the patient and myself were participating in the encounter The patient understood the above statements and agreed to proceed.   History of Present Illness:          Date of diagnosis of type 2 diabetes mellitus: 11/2017        Background history:       He had presented to his urgent care center with symptoms of excessive thirst, urination, fatigue and blurred vision starting in late December following a significant respiratory infection His blood sugar was markedly increased and he was referred for admission, however was treated in the emergency room for his blood sugar of 409 with IV fluids and an injection of insulin.  He did not have any renal function abnormalities or acidosis He was sent home on metformin 1 g twice a day  His baseline A1c at diagnosis was 11.6   Recent history:  Non-insulin hypoglycemic drugs the patient is taking are: Metformin 500 at breakfast and 1 g dinner, Farxiga 10 mg daily   A1c is last 5.8 compared to 6.9 earlier this year   Recently has increased his exercise and walking as much as 10,000 steps a day He has continued his [place] He thinks blood sugars are in the 120s in the mornings and usually not high other times  The following is a copy of the previous note  Current management, blood sugar patterns and problems identified: He had apparently taken  his metformin irregularly when seen by his PCP in 2/22, also had not followed up here after his last visit in 8/21  Since his A1c had gone up he started taking his metformin regularly 1 g twice daily but now is only taking half a tablet in the morning Most of his monitoring is done after breakfast or other meals but has 1 high reading fasting Lab nonfasting glucose 99  He is now trying to cut back significantly on carbohydrates His nephrologist saw him in 3/22 and started him on Farxiga 10 mg daily but patient did not understand that this would be effective for his diabetes control and also He is not having any side effects with this and reportedly has had follow-up renal function with nephrologist He has now lost 15 pounds since February  Side effects from medications have been: Nausea with 1 g metformin in the morning    Glucose monitoring:   Frequency of monitoring    less than once a day    Glucometer:  Contour .      Blood Glucose readings from monitor   AVERAGE 130  After breakfast average 131 and late afternoon average 129 Blood sugar range 83-165      Dietician visit, most recent: 01/2018               Exercise:  He is doing walking or other activities like yard work  Weight history:  Wt Readings from  Last 3 Encounters:  06/28/21 247 lb (112 kg)  05/31/21 244 lb (110.7 kg)  05/09/21 244 lb 12.8 oz (111 kg)    Glycemic control:   Lab Results  Component Value Date   HGBA1C 5.8 04/25/2021   HGBA1C 6.9 (A) 12/22/2020   HGBA1C 5.9 06/23/2020   Lab Results  Component Value Date   MICROALBUR 75.1 (H) 06/23/2020   LDLCALC Comment (A) 12/22/2020   CREATININE 1.34 06/27/2021   Lab Results  Component Value Date   MICRALBCREAT 59.1 (H) 06/23/2020    No results found for: FRUCTOSAMINE  LIPID management: Discussed in review of systems   Allergies as of 06/30/2021   No Known Allergies      Medication List        Accurate as of June 30, 2021  8:17 AM. If  you have any questions, ask your nurse or doctor.          acetaminophen 500 MG tablet Commonly known as: TYLENOL Take 500 mg by mouth every 6 (six) hours as needed for mild pain, fever or headache.   allopurinol 300 MG tablet Commonly known as: ZYLOPRIM TAKE 1 TABLET BY MOUTH EVERY DAY   aspirin EC 81 MG tablet Take 81 mg by mouth daily. Swallow whole.   atorvastatin 40 MG tablet Commonly known as: LIPITOR Take 1 tablet (40 mg total) by mouth daily.   blood glucose meter kit and supplies Please check your blood pressure twice a day and keep a log.   Contour Next Test test strip Generic drug: glucose blood USE AS INSTRUCTED TO TEST BLOOD SUGARS 2 TIMES DAILY. DX CODE   Contour Next Test test strip Generic drug: glucose blood USE AS INSTRUCTED TO TEST BLOOD SUGARS 1 TIME DAILY   Farxiga 10 MG Tabs tablet Generic drug: dapagliflozin propanediol Take 10 mg by mouth daily.   fenofibrate micronized 134 MG capsule Commonly known as: LOFIBRA TAKE 1 CAPSULE BY MOUTH EVERY DAY   Lancets Misc 1 each by Does not apply route 2 (two) times daily. To be used with Contour glucose meter. Dx: E11.8   lisinopril 10 MG tablet Commonly known as: ZESTRIL Take 10 mg by mouth daily.   Magnesium 500 MG Tabs Take 500 mg by mouth daily.   metFORMIN 1000 MG tablet Commonly known as: GLUCOPHAGE TAKE 1 TABLET (1,000 MG TOTAL) BY MOUTH 2 (TWO) TIMES DAILY WITH A MEAL.   omeprazole 20 MG tablet Commonly known as: PRILOSEC OTC Take 20 mg by mouth daily.   Potassium Gluconate 595 MG Caps Take 595 mg by mouth daily.   QC TUMERIC COMPLEX PO Take 1 capsule by mouth as needed.   Vascepa 1 g capsule Generic drug: icosapent Ethyl TAKE 2 CAPSULES BY MOUTH TWICE A DAY   vitamin C 1000 MG tablet Take 1,000 mg by mouth daily.   VITAMIN D PO Take 5,000 Units by mouth daily.   Vitamin D3 125 MCG (5000 UT) Caps Take 1 capsule by mouth daily.   VITAMIN K2 PO Take by mouth.         Allergies: No Known Allergies  Past Medical History:  Diagnosis Date   Cancer ([place]) 02/2004   ;Hx: of right kidney cancer; kidney removed in 2005   Diabetes mellitus without complication ([place])    GERD (gastroesophageal reflux disease)    Gout    Hx: of   Hyperlipidemia    Hypertension     Past Surgical History:  Procedure Laterality Date   FRACTURE  SURGERY N/A    [employer] 12/21/2020   Fx: tibula     Plateau fx:   HERNIA REPAIR N/A    [employer] 12/21/2020   INGUINAL HERNIA REPAIR     age 21   KIDNEY SURGERY  02/2004   Hx; of removal of right kidnety in 2005   ORIF TIBIA PLATEAU Left 08/25/2013   Procedure: OPEN REDUCTION INTERNAL FIXATION (ORIF) TIBIAL PLATEAU;  Surgeon: Renette Butters, MD;  Location: [facility];  Service: Orthopedics;  Laterality: Left;    Family History  Problem Relation Age of Onset   Heart disease Father    Hyperlipidemia Brother    Colon cancer Neg Hx    Esophageal cancer Neg Hx    Prostate cancer Neg Hx    Rectal cancer Neg Hx    Stomach cancer Neg Hx    Diabetes Neg Hx     Social History:  reports that he quit smoking about 17 years ago. His smoking use included cigarettes. He has never used smokeless tobacco. He reports current alcohol use of about 4.0 standard drinks per week. He reports that he does not use drugs.   Review of Systems   Lipid history:   He had been on fenofibrate along with Vascepa since 11/2018 Previously had baseline triglycerides over 800 LDL has been usually below 100 without a statin drug  Nonfasting triglycerides were over 500 on his last lab work  His PCP sent him to the cardiologist who is now managing his lipids  He did not start Niaspan as directed because his cardiologist told him not to do so However he is not taking 40 mg Lipitor, does have some muscle aches which are tolerable  Fasting triglycerides are improved at 352   Lab Results  Component Value Date   CHOL 127 06/27/2021   CHOL 193 04/25/2021    CHOL 206 (H) 12/22/2020   Lab Results  Component Value Date   HDL 25.20 (L) 06/27/2021   HDL 28.60 (L) 04/25/2021   HDL 21 (L) 12/22/2020   Lab Results  Component Value Date   LDLCALC Comment (A) 12/22/2020   [place] Comment 11/06/2018   [place] 104 (H) 08/01/2018   Lab Results  Component Value Date   TRIG 352.0 (H) 06/27/2021   TRIG (H) 04/25/2021    514.0 Triglyceride is over 400; calculations on Lipids are invalid.   TRIG 1,021 (HH) 12/22/2020   Lab Results  Component Value Date   CHOLHDL 5 06/27/2021   CHOLHDL 7 04/25/2021   CHOLHDL 9.8 (H) 12/22/2020   Lab Results  Component Value Date   LDLDIRECT 71.0 06/27/2021   LDLDIRECT 108.0 04/25/2021   LDLDIRECT 53.0 06/23/2020        Lab Results  Component Value Date   ALT 56 (H) 06/27/2021        Hypertension: On treatment since 2005  Now followed by nephrologist also has proteinuria and history of nephrectomy Microalbumin is last normal in 4/20  Blood pressure monitor at home  Taking 10 mg lisinopril, also on Farxiga 10 mg since 3/22  BP Readings from Last 3 Encounters:  06/28/21 137/72  05/31/21 (!) 144/80  05/09/21 120/78   Has borderline renal function as before  Lab Results  Component Value Date   CREATININE 1.34 06/27/2021   CREATININE 1.44 04/25/2021   CREATININE 1.23 12/22/2020    Most recent eye exam: 04/2019  Most recent foot exam: 2/20    LABS:  Lab on 06/27/2021  Component Date Value Ref  Range Status   Cholesterol 06/27/2021 127  0 - 200 mg/dL Final   ATP III Classification       Desirable:  < 200 mg/dL               Borderline High:  200 - 239 mg/dL          High:  > = 240 mg/dL   Triglycerides 06/27/2021 352.0 (A) 0.0 - 149.0 mg/dL Final   Normal:  <150 mg/dLBorderline High:  150 - 199 mg/dL   HDL 06/27/2021 25.20 (A) >39.00 mg/dL Final   VLDL 06/27/2021 70.4 (A) 0.0 - 40.0 mg/dL Final   Total CHOL/HDL Ratio 06/27/2021 5   Final                  Men          Women1/2  Average Risk     3.4          3.3Average Risk          5.0          4.42X Average Risk          9.6          7.13X Average Risk          15.0          11.0                       NonHDL 06/27/2021 101.31   Final   NOTE:  Non-HDL goal should be 30 mg/dL higher than patient's LDL goal (i.e. LDL goal of < 70 mg/dL, would have non-HDL goal of < 100 mg/dL)   Sodium 06/27/2021 137  135 - 145 mEq/L Final   Potassium 06/27/2021 4.8  3.5 - 5.1 mEq/L Final   Chloride 06/27/2021 102  96 - 112 mEq/L Final   CO2 06/27/2021 27  19 - 32 mEq/L Final   Glucose, Bld 06/27/2021 87  70 - 99 mg/dL Final   BUN 06/27/2021 28 (A) 6 - 23 mg/dL Final   Creatinine, Ser 06/27/2021 1.34  0.40 - 1.50 mg/dL Final   Total Bilirubin 06/27/2021 0.5  0.2 - 1.2 mg/dL Final   Alkaline Phosphatase 06/27/2021 57  39 - 117 U/L Final   AST 06/27/2021 36  0 - 37 U/L Final   ALT 06/27/2021 56 (A) 0 - 53 U/L Final   Total Protein 06/27/2021 7.6  6.0 - 8.3 g/dL Final   Albumin 06/27/2021 4.5  3.5 - 5.2 g/dL Final   GFR 06/27/2021 58.05 (A) >60.00 mL/min Final   Calculated using the CKD-EPI Creatinine Equation (2021)   Calcium 06/27/2021 10.2  8.4 - 10.5 mg/dL Final   Direct LDL 06/27/2021 71.0  mg/dL Final   Optimal:  <100 mg/dLNear or Above Optimal:  100-129 mg/dLBorderline High:  130-159 mg/dLHigh:  160-189 mg/dLVery High:  >190 mg/dL    Physical Examination:  There were no vitals taken for this visit.      ASSESSMENT:  HYPERTRIGLYCERIDEMIA: He has had persistent hypertriglyceridemia Although triglycerides appear better with adding atorvastatin his previous triglycerides were done nonfasting Also previously with improved diet he has had triglycerides in the 300+ range Currently triglycerides are 352 and likely improved with his going on a low-fat diet rather than atorvastatin  Currently also taking Vascepa and fenofibrate   Diabetes type 2 with obesity, on metformin  Not specifically addressed on this visit but fasting  glucose 87 and fairly good at home  His A1c is last excellent at 5.8, on metformin and Farxiga  Recent blood sugars are about 120 fasting   LIVER function abnormalities: Likely to be from fatty liver as he has had higher levels previously in 2/22 also, will defer management to PCP  PLAN:    He will continue to make efforts to lose weight Needs regular exercise program Since his PCP has referred him to lipid clinic will defer any management to cardiologist However still feels that niacin may be a good option to get triglycerides at least under 250  Need to recheck microalbumin and uric acid on the next visit  There are no Patient Instructions on file for this visit.      Elayne Snare 06/30/2021, 8:17 AM   Note: This office note was prepared with Dragon voice recognition system technology. Any transcriptional errors that result from this process are unintentional.

## 2021-07-11 DIAGNOSIS — N182 Chronic kidney disease, stage 2 (mild): Secondary | ICD-10-CM | POA: Diagnosis not present

## 2021-07-11 DIAGNOSIS — R809 Proteinuria, unspecified: Secondary | ICD-10-CM | POA: Diagnosis not present

## 2021-07-11 DIAGNOSIS — I129 Hypertensive chronic kidney disease with stage 1 through stage 4 chronic kidney disease, or unspecified chronic kidney disease: Secondary | ICD-10-CM | POA: Diagnosis not present

## 2021-07-11 DIAGNOSIS — E1129 Type 2 diabetes mellitus with other diabetic kidney complication: Secondary | ICD-10-CM | POA: Diagnosis not present

## 2021-07-13 ENCOUNTER — Other Ambulatory Visit: Payer: Self-pay | Admitting: Endocrinology

## 2021-07-17 NOTE — Telephone Encounter (Signed)
Is patient still on this medication? Did not see on your last office visit.

## 2021-07-22 IMAGING — CT CT CARDIAC CORONARY ARTERY CALCIUM SCORE
3 series · 14 of 20 positions shown, 16 images · non-contrast
Comparison: None.
COMPARISON: None.

Addendum:
EXAM:
OVER-READ INTERPRETATION  CT CHEST

The following report is an over-read performed by radiologist Dr.
Raiza Willingham [REDACTED] on 05/17/2021. This
over-read does not include interpretation of cardiac or coronary
anatomy or pathology. The coronary calcium score interpretation by
the cardiologist is attached.
CLINICAL DATA: Cardiovascular Disease Risk stratification
Coronary Calcium Score
TECHNIQUE: A gated, non-contrast computed tomography scan of the heart was
performed using 3mm slice thickness. Axial images were analyzed on a
dedicated workstation. Calcium scoring of the coronary arteries was
performed using the Agatston method.

[Series 2: casc 3.0 best diast 69 % (id) · axial · 0.38mm/px · z∈[+1296,+1377]mm · 4 of 45 slices shown]
[im 9/45  vessel]
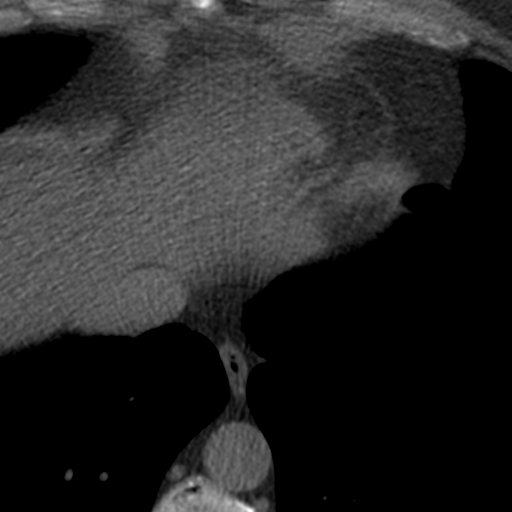
[im 18/45  vessel]
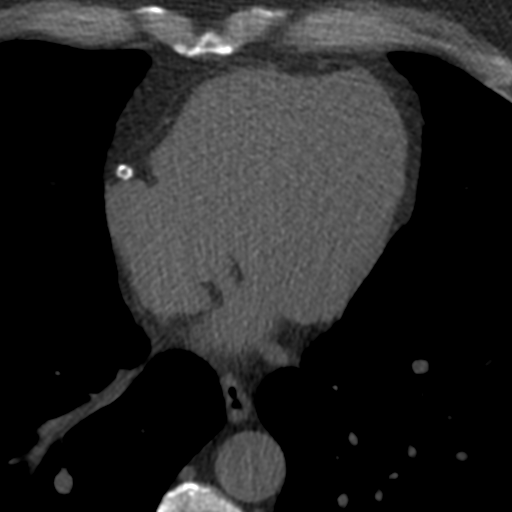
[im 27/45  vessel]
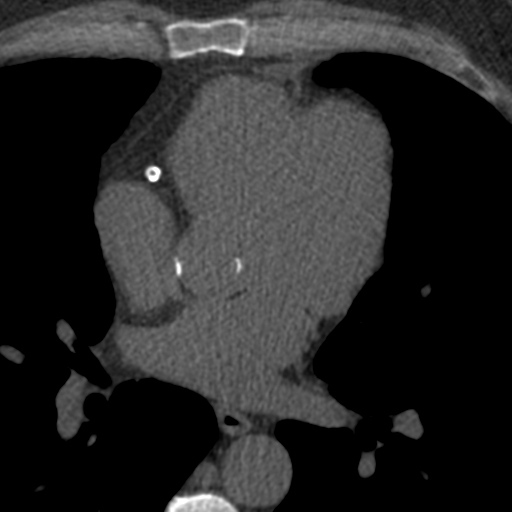
[im 36/45  vessel]
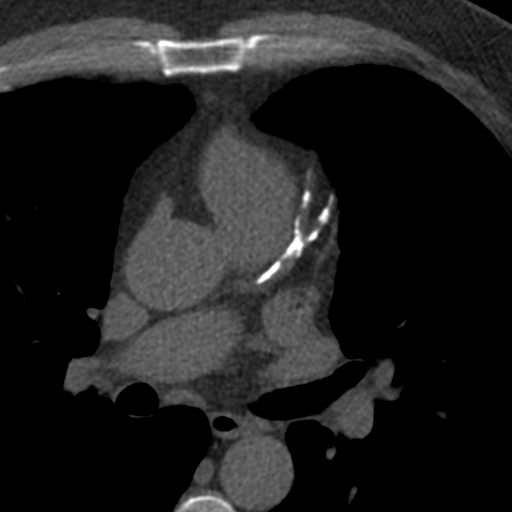

[Series 3: soft full fov 67 % · axial · 0.76mm/px · z∈[+1293,+1380]mm · 5 of 45 slices shown, 7 images]
[im 8/45  vessel]
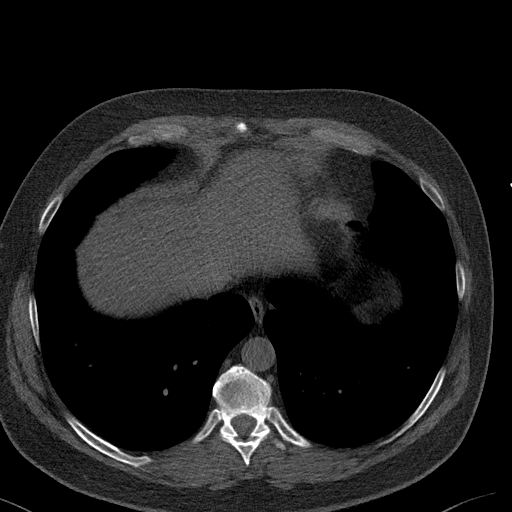
[im 8/45  lung]
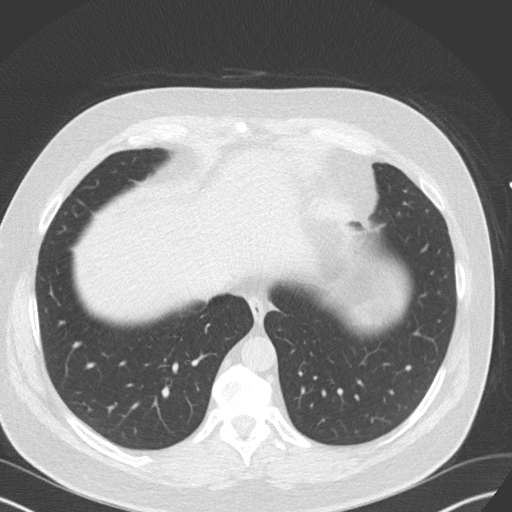
[im 15/45  vessel]
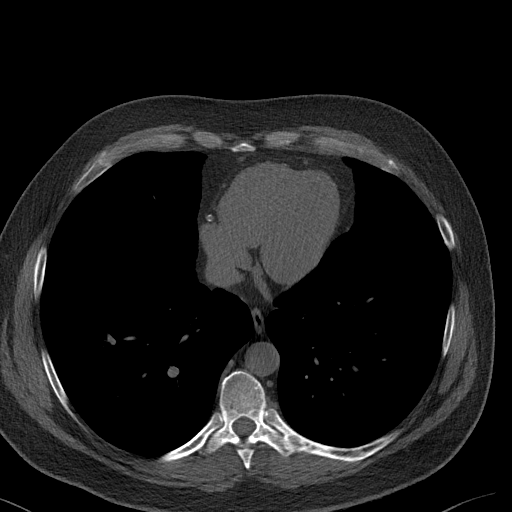
[im 23/45  vessel]
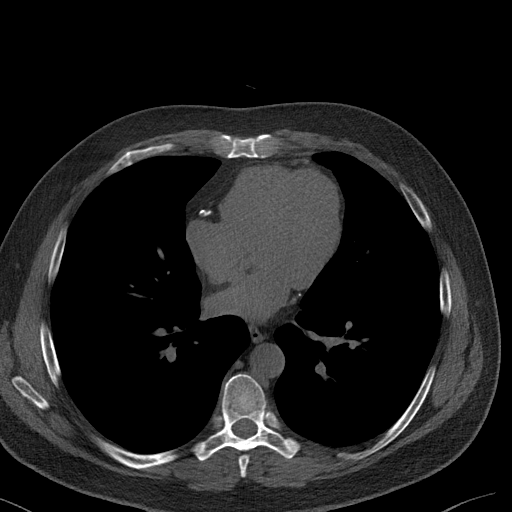
[im 30/45  vessel]
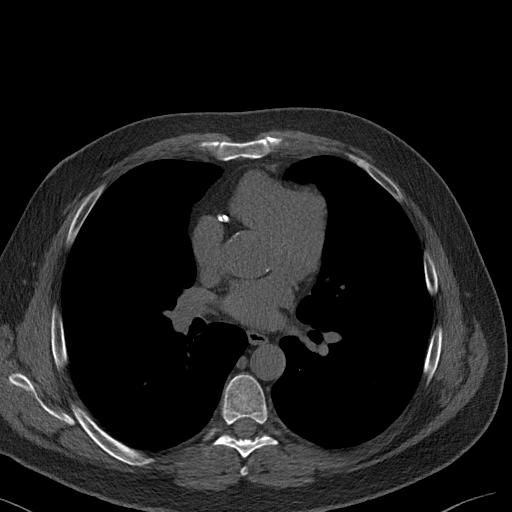
[im 37/45  vessel]
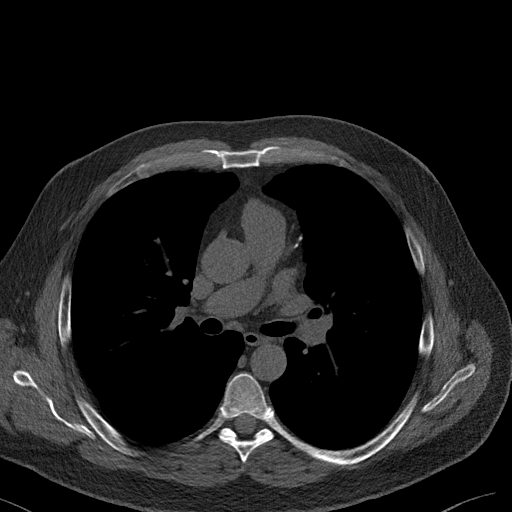
[im 37/45  lung]
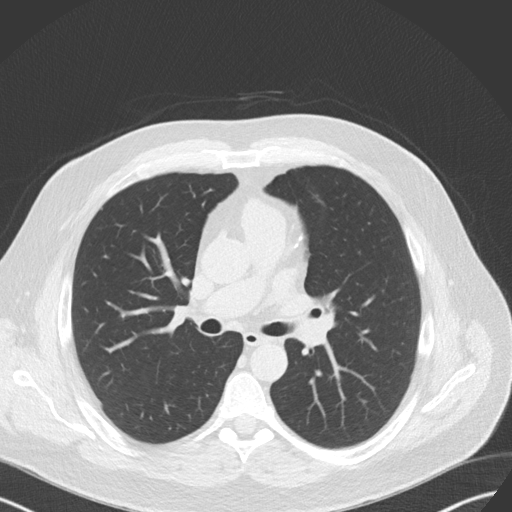

[Series 4: lungs 67 % · axial · 0.76mm/px · z∈[+1293,+1380]mm · 5 of 45 slices shown]
[im 8/45  vessel]
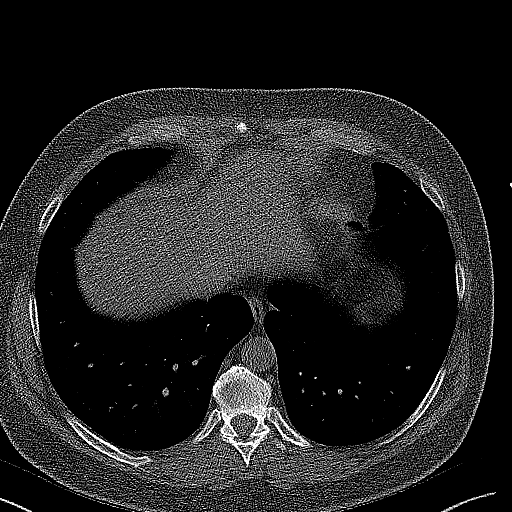
[im 15/45  vessel]
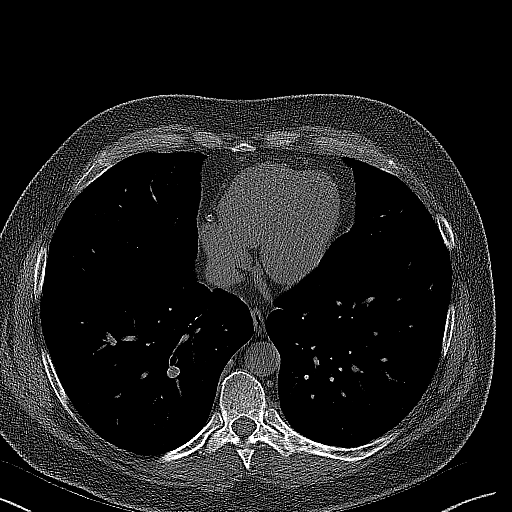
[im 23/45  vessel]
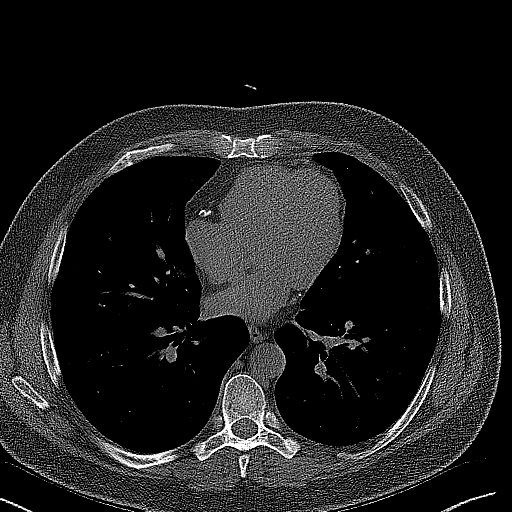
[im 30/45  vessel]
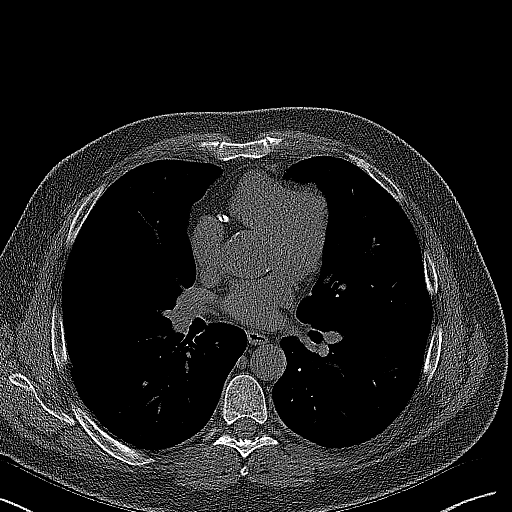
[im 37/45  vessel]
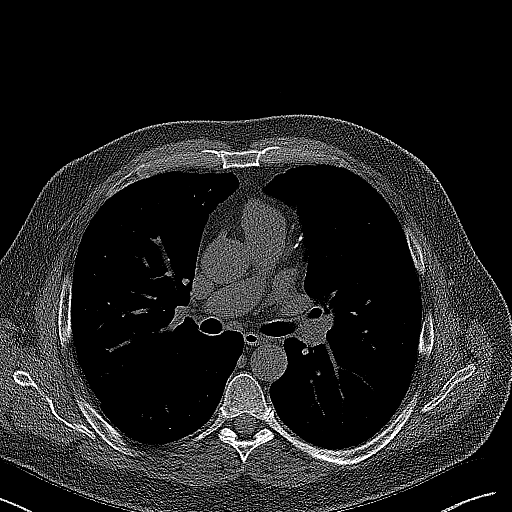

[14 of 20 positions shown; findings below may reference images not displayed]

FINDINGS: Atherosclerotic calcifications in the thoracic aorta. Within the
visualized portions of the thorax there are no suspicious appearing
pulmonary nodules or masses, there is no acute consolidative
airspace disease, no pleural effusions, no pneumothorax and no
lymphadenopathy. Visualized portions of the upper abdomen abdomen
demonstrates mild diffuse low attenuation throughout the visualized
hepatic parenchyma, indicative of hepatic steatosis. There are no
aggressive appearing lytic or blastic lesions noted in the
visualized portions of the skeleton.
IMPRESSION: 1.  Aortic Atherosclerosis (22KOR-HMU.U).
2. Hepatic steatosis.
FINDINGS: Coronary Calcium Score:

Left main: 0

Left anterior descending artery: 830

Left circumflex artery: 10

Right coronary artery: 8889

Total: 8222

Percentile: 99th

Pericardium: Normal.

Ascending Aorta: Normal caliber.

Non-cardiac: See separate report from [REDACTED].
IMPRESSION: Coronary calcium score of 8222. This was 99th percentile for age-,
race-, and sex-matched controls.



If CAC=0, it is reasonable to withhold statin therapy and reassess
in 5 to 10 years, as long as higher risk conditions are absent
(diabetes mellitus, family history of premature CHD in first degree
relatives (males <55 years; females <65 years), cigarette smoking,
or LDL >=190 mg/dL).

If CAC is 1 to 99, it is reasonable to initiate statin therapy for
patients >=55 years of age.

If CAC is >=100 or >=75th percentile, it is reasonable to initiate
statin therapy at any age.

Cardiology referral should be considered for patients with CAC
scores >=400 or >=75th percentile.

*6065 AHA/ACC/AACVPR/AAPA/ABC/PAYAL/JIM/FUWENG/[place]/GISELLE/MARTONE/STOTT
Guideline on the Management of Blood Cholesterol: A Report of the
American College of Cardiology/American Heart Association Task Force
on Clinical Practice Guidelines. J Am Coll Cardiol.
1298;73(24):6329-6066.

*** End of Addendum ***
EXAM:
OVER-READ INTERPRETATION  CT CHEST

The following report is an over-read performed by radiologist Dr.
Raiza Willingham [REDACTED] on 05/17/2021. This
over-read does not include interpretation of cardiac or coronary
anatomy or pathology. The coronary calcium score interpretation by
the cardiologist is attached.
FINDINGS: Atherosclerotic calcifications in the thoracic aorta. Within the
visualized portions of the thorax there are no suspicious appearing
pulmonary nodules or masses, there is no acute consolidative
airspace disease, no pleural effusions, no pneumothorax and no
lymphadenopathy. Visualized portions of the upper abdomen abdomen
demonstrates mild diffuse low attenuation throughout the visualized
hepatic parenchyma, indicative of hepatic steatosis. There are no
aggressive appearing lytic or blastic lesions noted in the
visualized portions of the skeleton.
IMPRESSION: 1.  Aortic Atherosclerosis (22KOR-HMU.U).
2. Hepatic steatosis.

## 2021-08-03 ENCOUNTER — Telehealth: Payer: Self-pay | Admitting: Pharmacy Technician

## 2021-08-03 NOTE — Telephone Encounter (Signed)
Patient Advocate Encounter   Received notification from [place] that prior authorization for VASCEPA 1G is required.   PA submitted on 08/03/2021 Key  [id number] Status is pending    [facility] will continue to follow   Burney Gauze, CPhT Patient [facility] Phone: [phone] Fax:  [phone]

## 2021-08-03 NOTE — Telephone Encounter (Signed)
Received notification from [place] regarding a prior authorization for VASCEPA. Authorization has been APPROVED from 08/03/21 to 08/02/22.

## 2021-08-18 ENCOUNTER — Other Ambulatory Visit: Payer: Self-pay | Admitting: Endocrinology

## 2021-09-06 DIAGNOSIS — E785 Hyperlipidemia, unspecified: Secondary | ICD-10-CM | POA: Diagnosis not present

## 2021-09-06 DIAGNOSIS — E1169 Type 2 diabetes mellitus with other specified complication: Secondary | ICD-10-CM | POA: Diagnosis not present

## 2021-09-07 LAB — NMR, LIPOPROFILE
Cholesterol, Total: 146 mg/dL (ref 100–199)
HDL Particle Number: 24.1 umol/L — ABNORMAL LOW (ref >=30.5)
HDL-C: 18 mg/dL — ABNORMAL LOW (ref >39)
LDL Particle Number: 1096 nmol/L — ABNORMAL HIGH (ref <1000)
LDL Size: 19.6 nm — ABNORMAL LOW (ref >20.5)
LDL-C (NIH Calc): 69 mg/dL (ref 0–99)
LP-IR Score: 76 — ABNORMAL HIGH (ref <=45)
Small LDL Particle Number: 952 nmol/L — ABNORMAL HIGH (ref <=527)
Triglycerides: 368 mg/dL — ABNORMAL HIGH (ref 0–149)

## 2021-09-07 LAB — APOLIPOPROTEIN B: Apolipoprotein B: 89 mg/dL (ref <90)

## 2021-09-08 ENCOUNTER — Telehealth (INDEPENDENT_AMBULATORY_CARE_PROVIDER_SITE_OTHER): Payer: BC Managed Care – PPO | Admitting: Internal Medicine

## 2021-09-08 ENCOUNTER — Encounter (HOSPITAL_BASED_OUTPATIENT_CLINIC_OR_DEPARTMENT_OTHER): Payer: Self-pay | Admitting: Internal Medicine

## 2021-09-08 VITALS — BP 126/76 | HR 74 | Ht 73.0 in | Wt 243.0 lb

## 2021-09-08 DIAGNOSIS — R931 Abnormal findings on diagnostic imaging of heart and coronary circulation: Secondary | ICD-10-CM | POA: Diagnosis not present

## 2021-09-08 DIAGNOSIS — E785 Hyperlipidemia, unspecified: Secondary | ICD-10-CM

## 2021-09-08 DIAGNOSIS — E1169 Type 2 diabetes mellitus with other specified complication: Secondary | ICD-10-CM | POA: Diagnosis not present

## 2021-09-08 DIAGNOSIS — I10 Essential (primary) hypertension: Secondary | ICD-10-CM

## 2021-09-08 DIAGNOSIS — Z8249 Family history of ischemic heart disease and other diseases of the circulatory system: Secondary | ICD-10-CM | POA: Diagnosis not present

## 2021-09-08 NOTE — Patient Instructions (Signed)
Medication Instructions:  NO CHANGES  *If you need a refill on your cardiac medications before your next appointment, please call your pharmacy*   Lab Work: FASTING NMR lipoprofile to check cholesterol in 6 months -- complete 1 week before your next appointment  If you have labs (blood work) drawn today and your tests are completely normal, you will receive your results only by: [place] (if you have MyChart) OR A paper copy in the mail If you have any lab test that is abnormal or we need to change your treatment, we will call you to review the results.  Follow-Up: At [facility], you and your health needs are our priority.  As part of our continuing mission to provide you with exceptional heart care, we have created designated Provider Care Teams.  These Care Teams include your primary Cardiologist (physician) and Advanced Practice Providers (APPs -  Physician Assistants and Nurse Practitioners) who all work together to provide you with the care you need, when you need it.  We recommend signing up for the patient portal called "MyChart".  Sign up information is provided on this After Visit Summary.  MyChart is used to connect with patients for Virtual Visits (Telemedicine).  Patients are able to view lab/test results, encounter notes, upcoming appointments, etc.  Non-urgent messages can be sent to your provider as well.   To learn more about what you can do with MyChart, go to [email or web address].    Your next appointment:   6 month(s)  The format for your next appointment:   In Person or Virtual   Provider: Dr. Lyman Bishop   Please call in December/January for an appointment in ~ April 2023

## 2021-09-08 NOTE — [facility] Notes (Signed)
Virtual Visit via Video Note   This visit type was conducted due to national recommendations for restrictions regarding the COVID-19 Pandemic (e.g. social distancing) in an effort to limit this patient's exposure and mitigate transmission in our community.  Due to his co-morbid illnesses, this patient is at least at moderate risk for complications without adequate follow up.  This format is felt to be most appropriate for this patient at this time.  All issues noted in this document were discussed and addressed.  A limited physical exam was performed with this format.  Please refer to the patient's chart for his consent to telehealth for [facility].      Date:  09/08/2021   ID:  Gordon Green, DOB 10/11/1961, MRN [id number] The patient was identified using 2 identifiers.  Evaluation Performed:  Follow-Up Visit  Patient Location:  [address] [place] [address]  Provider location:   [address] [place], [place] [address]  PCP:  Horald Pollen, MD  Cardiologist:  None Electrophysiologist:  None   Chief Complaint:  Follow-up dyslipidemia  History of Present Illness:    Gordon Green is a 60 y.o. male who presents via audio/video conferencing for a telehealth visit today.  This is a pleasant 60 year old male kindly referred by Dr. Mitchel Honour, for evaluation management of high triglycerides.  He recently established with his new primary care provider but has a longstanding history of high triglycerides he says dating back to at least his 70s.  He also has type 2 diabetes which appears well managed recent A1c of 5.8, hypertension and dyslipidemia.  He does report family history of heart disease including his father who had unfortunately died of an MI at age 80 and had high cholesterol.  He had previously been on fenofibrate and prior to that simvastatin Mediport apparently the simvastatin did not lower his cholesterol as well as expected.  He had  also been prescribed Vascepa but was only taking 2 g twice daily.  He also made dietary changes and had increased saturated fats in his triglycerides more recently everywhere 2022 showed triglycerides of 1021.  He then had increased his Vascepa to 2 g twice daily, changed his diet and was more active and triglycerides have come down significantly to 514, however his LDL cholesterol is elevated at 108.  He is not on a statin.  He does have a history of kidney cancer and is a survivor of this.  He is never had a stroke or MI that he is aware of.  Has not had any prior cardiac testing.  He denies any symptoms such as chest pain or worsening shortness of breath.   05/31/2021   Gordon Green returns today for follow-up.  He underwent recent calcium scoring which surprisingly showed an abnormally high calcium score of 2833.  This demonstrated multivessel coronary calcium which somewhat spared the left circumflex otherwise a significant finding.  He reports only about 3 years of diabetes therefore I think it is unlikely related to that.  Suspect there is family history of strong early onset heart disease in his father as well as dyslipidemia with high triglycerides are major risk factors.  We had a discussion today about possible stress testing however he said he did significant hike this past weekend for several hours in the [facility] and had no chest pain or significant shortness of breath.  He exercises regularly and says he is asymptomatic.  09/08/2021  Gordon Green returns today for  follow-up.  He seems to be tolerating atorvastatin fairly well.  Initially thought he had some muscle stiffness and aches but this seems to have improved.  His cholesterol has improved as well.  Lipid NMR shows an LDL-P 1096, LDL-C is 69, low HDL of 18 and triglycerides 368 however they had been as high as over thousand in the past.  In general he is on guideline directed therapy at this point and reaches his target  cholesterol.  His calcium score as mentioned above was significantly elevated but he continues to be active and denies any symptoms.  He said he bought recently a smart watch which monitors a number of parameters and he is following it closely.  The patient does not have symptoms concerning for COVID-19 infection (fever, chills, cough, or new SHORTNESS OF BREATH).    Prior CV studies:   The following studies were reviewed today:  Reviewed, lab work  PMHx:  Past Medical History:  Diagnosis Date   Cancer ([place]) 02/2004   ;Hx: of right kidney cancer; kidney removed in 2005   Diabetes mellitus without complication ([place])    GERD (gastroesophageal reflux disease)    Gout    Hx: of   Hyperlipidemia    Hypertension     Past Surgical History:  Procedure Laterality Date   FRACTURE SURGERY N/A    Phreesia 12/21/2020   Fx: tibula     [place]ate[place] fx:   HERNIA REPAIR N/A    Phreesia 12/21/2020   INGUINAL HERNIA REPAIR     age 23   KIDNEY SURGERY  02/2004   Hx; of removal of right kidnety in 2005   ORIF TIBIA PLATEAU Left 08/25/2013   Procedure: OPEN REDUCTION INTERNAL FIXATION (ORIF) TIBIAL PLATEAU;  Surgeon: Renette Butters, MD;  Location: [facility];  Service: Orthopedics;  Laterality: Left;    FAMHx:  Family History  Problem Relation Age of Onset   Heart disease Father    Hyperlipidemia Brother    Colon cancer Neg Hx    Esophageal cancer Neg Hx    Prostate cancer Neg Hx    Rectal cancer Neg Hx    Stomach cancer Neg Hx    Diabetes Neg Hx     SOCHx:   reports that he quit smoking about 17 years ago. His smoking use included cigarettes. He has never used smokeless tobacco. He reports current alcohol use of about 4.0 standard drinks per week. He reports that he does not use drugs.  ALLERGIES:  No Known Allergies  MEDS:  Current Meds  Medication Sig   acetaminophen (TYLENOL) 500 MG tablet Take 500 mg by mouth every 6 (six) hours as needed for mild pain, fever or headache.    allopurinol (ZYLOPRIM) 300 MG tablet TAKE 1 TABLET BY MOUTH EVERY DAY   Ascorbic Acid (VITAMIN C) 1000 MG tablet Take 1,000 mg by mouth daily.   aspirin EC 81 MG tablet Take 81 mg by mouth daily. Swallow whole.   blood glucose meter kit and supplies Please check your blood pressure twice a day and keep a log.   Cholecalciferol (VITAMIN D3) 125 MCG (5000 UT) CAPS Take 1 capsule by mouth daily.   CONTOUR NEXT TEST test strip USE AS INSTRUCTED TO TEST BLOOD SUGARS 2 TIMES DAILY. DX CODE   CONTOUR NEXT TEST test strip USE AS INSTRUCTED TO TEST BLOOD SUGARS 1 TIME DAILY   FARXIGA 10 MG TABS tablet Take 10 mg by mouth daily.   fenofibrate micronized (LOFIBRA) 134 MG  capsule TAKE 1 CAPSULE BY MOUTH EVERY DAY   Lancets MISC 1 each by Does not apply route 2 (two) times daily. To be used with Contour glucose meter. Dx: E11.8   lisinopril (ZESTRIL) 20 MG tablet Take 20 mg by mouth daily.   Menaquinone-7 (VITAMIN K2 PO) Take by mouth.   metFORMIN (GLUCOPHAGE) 1000 MG tablet TAKE 1 TABLET (1,000 MG TOTAL) BY MOUTH 2 (TWO) TIMES DAILY WITH A MEAL.   omeprazole (PRILOSEC OTC) 20 MG tablet Take 20 mg by mouth daily.   Turmeric (QC TUMERIC COMPLEX PO) Take 1 capsule by mouth as needed (1500 mg).   VASCEPA 1 g capsule TAKE 2 CAPSULES BY MOUTH TWICE A DAY   [DISCONTINUED] lisinopril (ZESTRIL) 10 MG tablet Take 10 mg by mouth daily.     ROS: Pertinent items noted in HPI and remainder of comprehensive ROS otherwise negative.  Labs/Other Tests and Data Reviewed:    Recent Labs: 06/27/2021: ALT 56; BUN 28; Creatinine, Ser 1.34; Potassium 4.8; Sodium 137   Recent Lipid Panel Lab Results  Component Value Date/Time   CHOL 127 06/27/2021 10:49 AM   CHOL 206 (H) 12/22/2020 01:52 PM   TRIG 352.0 (H) 06/27/2021 10:49 AM   HDL 25.20 (L) 06/27/2021 10:49 AM   HDL 21 (L) 12/22/2020 01:52 PM   CHOLHDL 5 06/27/2021 10:49 AM   LDLCALC Comment (A) 12/22/2020 01:52 PM   LDLDIRECT 71.0 06/27/2021 10:49 AM    Wt  Readings from Last 3 Encounters:  09/08/21 243 lb (110.2 kg)  06/28/21 247 lb (112 kg)  05/31/21 244 lb (110.7 kg)     Exam:    Vital Signs:  BP 126/76   Pulse 74   Ht 6' 1"  (1.854 m)   Wt 243 lb (110.2 kg)   SpO2 97%   BMI 32.06 kg/m    General appearance: alert and no distress Lungs: No visual respiratory difficulty Abdomen: Mildly obese Extremities: extremities normal, atraumatic, no cyanosis or edema Skin: Skin color, texture, turgor normal. 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  I will continue on the current combination of atorvastatin and Vascepa as well as low-dose aspirin and treatments for diabetes to try to continue to target a low A1c.  He should monitor for symptoms that may be associated with his high calcium score and is to contact me if he becomes symptomatic at which point he likely need heart catheterization.  Plan follow-up with me in 6 months or sooner as necessary.  COVID-19 Education: The signs and symptoms of COVID-19 were discussed with the patient and how to seek care for testing (follow up with PCP or arrange E-visit).  The importance of social distancing was discussed today.  Patient Risk:   After full review of this patients clinical status, I feel that they are at least moderate risk at this time.  Time:   Today, I have spent 15 minutes with the patient with telehealth technology discussing dyslipidemia, elevated calcium score, recent lab work.     Medication Adjustments/Labs and Tests Ordered: Current medicines are reviewed at length with the patient today.  Concerns regarding medicines are outlined above.    Tests Ordered: Orders Placed This Encounter  Procedures  NMR, lipoprofile    Medication Changes: No orders of the defined types were placed in this encounter.   Disposition:  in 6 month(s)  Pixie Casino, MD, [facility], [place] Director of the Advanced Lipid Disorders &  Cardiovascular Risk Reduction Clinic Diplomate of the American Board of Clinical Lipidology Attending Cardiologist  Direct Dial: [phone]  Fax: [phone]  Website:  [email or web address]  Pixie Casino, MD  09/08/2021 11:58 AM

## 2021-09-15 ENCOUNTER — Other Ambulatory Visit: Payer: Self-pay | Admitting: Endocrinology

## 2021-09-17 ENCOUNTER — Other Ambulatory Visit: Payer: Self-pay | Admitting: Endocrinology

## 2021-09-21 ENCOUNTER — Other Ambulatory Visit: Payer: Self-pay | Admitting: Endocrinology

## 2021-10-15 ENCOUNTER — Other Ambulatory Visit: Payer: Self-pay | Admitting: Endocrinology

## 2021-10-21 ENCOUNTER — Other Ambulatory Visit: Payer: Self-pay | Admitting: Emergency Medicine

## 2021-10-21 DIAGNOSIS — E118 Type 2 diabetes mellitus with unspecified complications: Secondary | ICD-10-CM

## 2021-11-14 ENCOUNTER — Other Ambulatory Visit: Payer: Self-pay | Admitting: Endocrinology

## 2021-11-30 ENCOUNTER — Other Ambulatory Visit: Payer: Self-pay | Admitting: Endocrinology

## 2021-12-28 DIAGNOSIS — N182 Chronic kidney disease, stage 2 (mild): Secondary | ICD-10-CM | POA: Diagnosis not present

## 2022-01-02 ENCOUNTER — Other Ambulatory Visit: Payer: Self-pay | Admitting: Endocrinology

## 2022-02-01 DIAGNOSIS — N182 Chronic kidney disease, stage 2 (mild): Secondary | ICD-10-CM | POA: Diagnosis not present

## 2022-02-01 DIAGNOSIS — R809 Proteinuria, unspecified: Secondary | ICD-10-CM | POA: Diagnosis not present

## 2022-02-01 DIAGNOSIS — E1129 Type 2 diabetes mellitus with other diabetic kidney complication: Secondary | ICD-10-CM | POA: Diagnosis not present

## 2022-02-01 DIAGNOSIS — I129 Hypertensive chronic kidney disease with stage 1 through stage 4 chronic kidney disease, or unspecified chronic kidney disease: Secondary | ICD-10-CM | POA: Diagnosis not present

## 2022-02-08 ENCOUNTER — Other Ambulatory Visit: Payer: Self-pay | Admitting: Endocrinology

## 2022-02-15 ENCOUNTER — Other Ambulatory Visit: Payer: Self-pay | Admitting: Endocrinology

## 2022-03-07 ENCOUNTER — Other Ambulatory Visit: Payer: Self-pay | Admitting: Endocrinology

## 2022-03-16 ENCOUNTER — Encounter: Payer: Self-pay | Admitting: *Deleted

## 2022-03-16 DIAGNOSIS — Z006 Encounter for examination for normal comparison and control in clinical research program: Secondary | ICD-10-CM

## 2022-03-16 NOTE — Research (Signed)
Message left for Gordon Green about [name]. Encouraged him to call back. ?

## 2022-04-04 ENCOUNTER — Other Ambulatory Visit (INDEPENDENT_AMBULATORY_CARE_PROVIDER_SITE_OTHER): Payer: BC Managed Care – PPO

## 2022-04-04 DIAGNOSIS — E669 Obesity, unspecified: Secondary | ICD-10-CM

## 2022-04-04 DIAGNOSIS — Z8739 Personal history of other diseases of the musculoskeletal system and connective tissue: Secondary | ICD-10-CM | POA: Diagnosis not present

## 2022-04-04 DIAGNOSIS — E1169 Type 2 diabetes mellitus with other specified complication: Secondary | ICD-10-CM

## 2022-04-04 DIAGNOSIS — R809 Proteinuria, unspecified: Secondary | ICD-10-CM

## 2022-04-04 LAB — COMPREHENSIVE METABOLIC PANEL
ALT: 49 U/L (ref 0–53)
AST: 29 U/L (ref 0–37)
Albumin: 4.6 g/dL (ref 3.5–5.2)
Alkaline Phosphatase: 41 U/L (ref 39–117)
BUN: 27 mg/dL — ABNORMAL HIGH (ref 6–23)
CO2: 29 mEq/L (ref 19–32)
Calcium: 10 mg/dL (ref 8.4–10.5)
Chloride: 101 mEq/L (ref 96–112)
Creatinine, Ser: 1.35 mg/dL (ref 0.40–1.50)
GFR: 57.22 mL/min — ABNORMAL LOW (ref >60.00)
Glucose, Bld: 96 mg/dL (ref 70–99)
Potassium: 4.9 mEq/L (ref 3.5–5.1)
Sodium: 138 mEq/L (ref 135–145)
Total Bilirubin: 0.7 mg/dL (ref 0.2–1.2)
Total Protein: 7.5 g/dL (ref 6.0–8.3)

## 2022-04-04 LAB — URIC ACID: Uric Acid, Serum: 5.1 mg/dL (ref 4.0–7.8)

## 2022-04-04 LAB — MICROALBUMIN / CREATININE URINE RATIO
Creatinine,U: 67.7 mg/dL
Microalb Creat Ratio: 15.7 mg/g (ref 0.0–30.0)
Microalb, Ur: 10.6 mg/dL — ABNORMAL HIGH (ref 0.0–1.9)

## 2022-04-04 LAB — HEMOGLOBIN A1C: Hgb A1c MFr Bld: 5.9 % (ref 4.6–6.5)

## 2022-04-12 ENCOUNTER — Other Ambulatory Visit: Payer: Self-pay | Admitting: Endocrinology

## 2022-04-12 ENCOUNTER — Telehealth (INDEPENDENT_AMBULATORY_CARE_PROVIDER_SITE_OTHER): Payer: BC Managed Care – PPO | Admitting: Endocrinology

## 2022-04-12 DIAGNOSIS — E669 Obesity, unspecified: Secondary | ICD-10-CM | POA: Diagnosis not present

## 2022-04-12 DIAGNOSIS — E1169 Type 2 diabetes mellitus with other specified complication: Secondary | ICD-10-CM | POA: Diagnosis not present

## 2022-04-12 DIAGNOSIS — R809 Proteinuria, unspecified: Secondary | ICD-10-CM

## 2022-04-12 DIAGNOSIS — Z8739 Personal history of other diseases of the musculoskeletal system and connective tissue: Secondary | ICD-10-CM | POA: Diagnosis not present

## 2022-04-12 NOTE — [facility] Notes (Signed)
Patient ID: Gordon Green, male   DOB: [birth year], 61 y.o.   MRN: [id number]          Reason for Appointment: Follow-up for various problems  I connected with the above-named patient by video enabled telemedicine application and verified that I am speaking with the correct person. The patient was explained the limitations of evaluation and management by telemedicine and the availability of in person appointments.  Patient also understood that there may be a patient responsible charge related to this service . Location of the patient: Patient's home . Location of the provider: Physician office Only the patient and myself were participating in the encounter The patient understood the above statements and agreed to proceed.   History of Present Illness:          Date of diagnosis of type 2 diabetes mellitus: 11/2017        Background history:       He had presented to his urgent care center with symptoms of excessive thirst, urination, fatigue and blurred vision starting in late December following a significant respiratory infection His blood sugar was markedly increased and he was referred for admission, however was treated in the emergency room for his blood sugar of 409 with IV fluids and an injection of insulin.  He did not have any renal function abnormalities or acidosis He was sent home on metformin 1 g twice a day  His baseline A1c at diagnosis was 11.6   Recent history:  Non-insulin hypoglycemic drugs the patient is taking are: Metformin 500 at breakfast and 1 g dinner, Farxiga 10 mg daily   A1c is about the same at 5.9    Recently has increased his exercise and walking as much as 10,000 steps a day He has continued his [place] He thinks blood sugars are in the 120s in the mornings and usually not high other times  Current management, blood sugar patterns and problems identified: Blood sugar monitoring has been infrequent with only 4 readings in the month of May  mostly fasting  He has lost only 3 more pounds since his last visit he is still trying to increase his exercise level  As before he is usually trying to eat smaller carbohydrate portions and more salads  Continues to take [place] which was initially given by his nephrologist without side effects He thinks that his blood sugars are relatively higher when he wakes up although lab glucose was 96 at about 10 AM in the office Recent fasting range 110-144, lunchtime 95  Side effects from medications have been: Nausea with 1 g metformin in the morning    Glucose monitoring:   Frequency of monitoring    less than once a day    Glucometer:  Contour .      Blood Glucose readings from monitor as above  Dietician visit, most recent: 01/2018               Exercise:  He is doing walking or other activities like yard work  Weight history:  Wt Readings from Last 3 Encounters:  09/08/21 243 lb (110.2 kg)  06/28/21 247 lb (112 kg)  05/31/21 244 lb (110.7 kg)    Glycemic control:   Lab Results  Component Value Date   HGBA1C 5.9 04/04/2022   HGBA1C 5.8 04/25/2021   HGBA1C 6.9 (A) 12/22/2020   Lab Results  Component Value Date   MICROALBUR 10.6 (H) 04/04/2022   LDLCALC Comment (A) 12/22/2020   CREATININE 1.35 04/04/2022  Lab Results  Component Value Date   MICRALBCREAT 15.7 04/04/2022    No results found for: "FRUCTOSAMINE"  LIPID management: Discussed in review of systems   Allergies as of 04/12/2022   No Known Allergies      Medication List        Accurate as of April 12, 2022 11:59 PM. If you have any questions, ask your nurse or doctor.          acetaminophen 500 MG tablet Commonly known as: TYLENOL Take 500 mg by mouth every 6 (six) hours as needed for mild pain, fever or headache.   allopurinol 300 MG tablet Commonly known as: ZYLOPRIM TAKE 1 TABLET BY MOUTH EVERY DAY   aspirin EC 81 MG tablet Take 81 mg by mouth daily. Swallow whole.   atorvastatin 40 MG  tablet Commonly known as: LIPITOR Take 1 tablet (40 mg total) by mouth daily.   blood glucose meter kit and supplies Please check your blood pressure twice a day and keep a log.   Contour Next Test test strip Generic drug: glucose blood USE AS INSTRUCTED TO TEST BLOOD SUGARS 2 TIMES DAILY. DX CODE   Contour Next Test test strip Generic drug: glucose blood USE AS INSTRUCTED TO TEST BLOOD SUGARS 1 TIME DAILY   Farxiga 10 MG Tabs tablet Generic drug: dapagliflozin propanediol Take 10 mg by mouth daily.   fenofibrate micronized 134 MG capsule Commonly known as: LOFIBRA TAKE 1 CAPSULE BY MOUTH EVERY DAY   Lancets Misc 1 each by Does not apply route 2 (two) times daily. To be used with Contour glucose meter. Dx: E11.8   lisinopril 20 MG tablet Commonly known as: ZESTRIL Take 20 mg by mouth daily.   Magnesium 500 MG Tabs Take 500 mg by mouth daily.   metFORMIN 1000 MG tablet Commonly known as: GLUCOPHAGE TAKE 1 TABLET (1,000 MG TOTAL) BY MOUTH 2 (TWO) TIMES DAILY WITH A MEAL.   omeprazole 20 MG tablet Commonly known as: PRILOSEC OTC Take 20 mg by mouth daily.   Potassium Gluconate 595 MG Caps Take 595 mg by mouth daily.   QC TUMERIC COMPLEX PO Take 1 capsule by mouth as needed (1500 mg).   Vascepa 1 g capsule Generic drug: icosapent Ethyl TAKE 2 CAPSULES BY MOUTH TWICE A DAY   vitamin C 1000 MG tablet Take 1,000 mg by mouth daily.   VITAMIN D PO Take 5,000 Units by mouth daily.   Vitamin D3 125 MCG (5000 UT) Caps Take 1 capsule by mouth daily.   VITAMIN K2 PO Take by mouth.        Allergies: No Known Allergies  Past Medical History:  Diagnosis Date  . Cancer ([place]) 02/2004   ;Hx: of right kidney cancer; kidney removed in 2005  . Diabetes mellitus without complication ([place])   . GERD (gastroesophageal reflux disease)   . Gout    Hx: of  . Hyperlipidemia   . Hypertension     Past Surgical History:  Procedure Laterality Date  . FRACTURE SURGERY  N/A    Phreesia 12/21/2020  . Fx: [place] fx:  Marland Kitchen HERNIA REPAIR N/A    Phreesia 12/21/2020  . INGUINAL HERNIA REPAIR     age 61  . KIDNEY SURGERY  02/2004   Hx; of removal of right kidnety in 2005  . ORIF TIBIA PLATEAU Left 08/25/2013   Procedure: OPEN REDUCTION INTERNAL FIXATION (ORIF) TIBIAL PLATEAU;  Surgeon: Renette Butters, MD;  Location:  [facility];  Service: Orthopedics;  Laterality: Left;    Family History  Problem Relation Age of Onset  . Heart disease Father   . Hyperlipidemia Brother   . Colon cancer Neg Hx   . Esophageal cancer Neg Hx   . Prostate cancer Neg Hx   . Rectal cancer Neg Hx   . Stomach cancer Neg Hx   . Diabetes Neg Hx     Social History:  reports that he quit smoking about 18 years ago. His smoking use included cigarettes. He has never used smokeless tobacco. He reports current alcohol use of about 4.0 standard drinks of alcohol per week. He reports that he does not use drugs.   Review of Systems   Lipid history:   He had been on fenofibrate along with Vascepa since 11/2018 Previously had baseline triglycerides over 800 LDL has been usually below 100 without a statin drug  His PCP sent him to the cardiologist who is now managing his lipids  Since last visit he is back to taking 40 mg Lipitor, does have some muscle aches at this time now Continues on Vascepa and fenofibrate Waiting for follow-up with cardiologist [place]  NMR panel showed particle number over 1000 with small LDL size and upper normal Apo B   Lab Results  Component Value Date   CHOL 127 06/27/2021   CHOL 193 04/25/2021   CHOL 206 (H) 12/22/2020   Lab Results  Component Value Date   HDL 25.20 (L) 06/27/2021   HDL 28.60 (L) 04/25/2021   HDL 21 (L) 12/22/2020   Lab Results  Component Value Date   LDLCALC Comment (A) 12/22/2020   [place] Comment 11/06/2018   [place] 104 (H) 08/01/2018   Lab Results  Component Value Date   TRIG 352.0 (H) 06/27/2021   TRIG (H)  04/25/2021    514.0 Triglyceride is over 400; calculations on Lipids are invalid.   TRIG 1,021 (HH) 12/22/2020   Lab Results  Component Value Date   CHOLHDL 5 06/27/2021   CHOLHDL 7 04/25/2021   CHOLHDL 9.8 (H) 12/22/2020   Lab Results  Component Value Date   LDLDIRECT 71.0 06/27/2021   LDLDIRECT 108.0 04/25/2021   LDLDIRECT 53.0 06/23/2020        Lab Results  Component Value Date   ALT 49 04/04/2022        Hypertension: On treatment since 2005  Still followed by nephrologist also has proteinuria and history of nephrectomy Microalbumin is normal  Blood pressure monitor at home shows recent reading of 131/88 but otherwise lower, lisinopril has been increased  Taking 20 mg lisinopril, also on Farxiga 10 mg since 3/22  BP Readings from Last 3 Encounters:  09/08/21 126/76  06/28/21 137/72  05/31/21 (!) 144/80   Has borderline renal function as before  Lab Results  Component Value Date   CREATININE 1.35 04/04/2022   CREATININE 1.34 06/27/2021   CREATININE 1.44 04/25/2021    Most recent eye exam: 04/2019  Most recent foot exam: 12/21    LABS:  No visits with results within 1 Week(s) from this visit.  Latest known visit with results is:  Lab on 04/04/2022  Component Date Value Ref Range Status  . Uric Acid, Serum 04/04/2022 5.1  4.0 - 7.8 mg/dL Final  . Microalb, Ur 04/04/2022 10.6 (H)  0.0 - 1.9 mg/dL Final  . Creatinine,U 04/04/2022 67.7  mg/dL Final  . Microalb Creat Ratio 04/04/2022 15.7  0.0 - 30.0 mg/g Final  . Sodium 04/04/2022  138  135 - 145 mEq/L Final  . Potassium 04/04/2022 4.9  3.5 - 5.1 mEq/L Final  . Chloride 04/04/2022 101  96 - 112 mEq/L Final  . CO2 04/04/2022 29  19 - 32 mEq/L Final  . Glucose, Bld 04/04/2022 96  70 - 99 mg/dL Final  . BUN 04/04/2022 27 (H)  6 - 23 mg/dL Final  . Creatinine, Ser 04/04/2022 1.35  0.40 - 1.50 mg/dL Final  . Total Bilirubin 04/04/2022 0.7  0.2 - 1.2 mg/dL Final  . Alkaline Phosphatase 04/04/2022 41  39 -  117 U/L Final  . AST 04/04/2022 29  0 - 37 U/L Final  . ALT 04/04/2022 49  0 - 53 U/L Final  . Total Protein 04/04/2022 7.5  6.0 - 8.3 g/dL Final  . Albumin 04/04/2022 4.6  3.5 - 5.2 g/dL Final  . GFR 04/04/2022 57.22 (L)  >60.00 mL/min Final   Calculated using the CKD-EPI Creatinine Equation (2021)  . Calcium 04/04/2022 10.0  8.4 - 10.5 mg/dL Final  . Hgb A1c MFr Bld 04/04/2022 5.9  4.6 - 6.5 % Final   Glycemic Control Guidelines for People with Diabetes:Non Diabetic:  <6%Goal of Therapy: <7%Additional Action Suggested:  >8%     Physical Examination:  There were no vitals taken for this visit.      ASSESSMENT:  HYPERTRIGLYCERIDEMIA: He has had persistent hypertriglyceridemia Now managed by cardiologist These may be improved with adding Lipitor but waiting for follow-up Currently also taking Vascepa and fenofibrate   Diabetes type 2 with obesity, on metformin and Farxiga  His A1c is again below 6% Recently weight loss has been slow but he is trying to work on his diet and exercise   LIVER function abnormalities: Resolved  Uric acid normal on allopurinol Continue same dosage  PLAN:    He will continue to make efforts to lose weight Increase exercise program Can follow-up in 6 months again  There are no Patient Instructions on file for this visit.      Elayne Snare 04/13/2022, 8:42 AM   Note: This office note was prepared with Dragon voice recognition system technology. Any transcriptional errors that result from this process are unintentional.

## 2022-04-13 ENCOUNTER — Encounter: Payer: Self-pay | Admitting: *Deleted

## 2022-04-13 DIAGNOSIS — Z006 Encounter for examination for normal comparison and control in clinical research program: Secondary | ICD-10-CM

## 2022-04-13 NOTE — Research (Signed)
Message left for Mr Deriso about [name]. Encouraged him to call back.

## 2022-04-21 ENCOUNTER — Other Ambulatory Visit: Payer: Self-pay | Admitting: Emergency Medicine

## 2022-04-21 DIAGNOSIS — E118 Type 2 diabetes mellitus with unspecified complications: Secondary | ICD-10-CM

## 2022-04-24 ENCOUNTER — Other Ambulatory Visit: Payer: Self-pay | Admitting: Endocrinology

## 2022-05-03 DIAGNOSIS — N182 Chronic kidney disease, stage 2 (mild): Secondary | ICD-10-CM | POA: Diagnosis not present

## 2022-05-09 ENCOUNTER — Other Ambulatory Visit: Payer: Self-pay | Admitting: Endocrinology

## 2022-05-21 ENCOUNTER — Other Ambulatory Visit (HOSPITAL_BASED_OUTPATIENT_CLINIC_OR_DEPARTMENT_OTHER): Payer: Self-pay | Admitting: Internal Medicine

## 2022-06-07 ENCOUNTER — Other Ambulatory Visit: Payer: Self-pay | Admitting: Endocrinology

## 2022-06-11 ENCOUNTER — Other Ambulatory Visit: Payer: Self-pay | Admitting: Endocrinology

## 2022-07-08 ENCOUNTER — Other Ambulatory Visit: Payer: Self-pay | Admitting: Endocrinology

## 2022-07-16 DIAGNOSIS — N182 Chronic kidney disease, stage 2 (mild): Secondary | ICD-10-CM | POA: Diagnosis not present

## 2022-07-20 DIAGNOSIS — N182 Chronic kidney disease, stage 2 (mild): Secondary | ICD-10-CM | POA: Diagnosis not present

## 2022-07-20 DIAGNOSIS — R809 Proteinuria, unspecified: Secondary | ICD-10-CM | POA: Diagnosis not present

## 2022-07-20 DIAGNOSIS — E1129 Type 2 diabetes mellitus with other diabetic kidney complication: Secondary | ICD-10-CM | POA: Diagnosis not present

## 2022-07-20 DIAGNOSIS — I129 Hypertensive chronic kidney disease with stage 1 through stage 4 chronic kidney disease, or unspecified chronic kidney disease: Secondary | ICD-10-CM | POA: Diagnosis not present

## 2022-07-23 ENCOUNTER — Other Ambulatory Visit: Payer: Self-pay | Admitting: Emergency Medicine

## 2022-07-23 DIAGNOSIS — E118 Type 2 diabetes mellitus with unspecified complications: Secondary | ICD-10-CM

## 2022-07-25 ENCOUNTER — Other Ambulatory Visit (HOSPITAL_COMMUNITY): Payer: Self-pay

## 2022-07-25 ENCOUNTER — Telehealth: Payer: Self-pay

## 2022-07-25 NOTE — Telephone Encounter (Addendum)
Patient Advocate Encounter  Prior Authorization for Vascepa 1GM capsules has been approved.    Key:  [id number] Effective dates: 07/25/2022 through 07/24/2023

## 2022-09-10 ENCOUNTER — Other Ambulatory Visit: Payer: Self-pay | Admitting: Endocrinology

## 2022-09-13 ENCOUNTER — Ambulatory Visit: Payer: BC Managed Care – PPO | Attending: Internal Medicine | Admitting: Internal Medicine

## 2022-09-13 ENCOUNTER — Encounter: Payer: Self-pay | Admitting: Internal Medicine

## 2022-09-13 VITALS — BP 130/70 | HR 81 | Ht 73.0 in | Wt 241.2 lb

## 2022-09-13 DIAGNOSIS — E1169 Type 2 diabetes mellitus with other specified complication: Secondary | ICD-10-CM

## 2022-09-13 DIAGNOSIS — R931 Abnormal findings on diagnostic imaging of heart and coronary circulation: Secondary | ICD-10-CM | POA: Diagnosis not present

## 2022-09-13 DIAGNOSIS — E785 Hyperlipidemia, unspecified: Secondary | ICD-10-CM

## 2022-09-13 DIAGNOSIS — Z8249 Family history of ischemic heart disease and other diseases of the circulatory system: Secondary | ICD-10-CM | POA: Diagnosis not present

## 2022-09-13 DIAGNOSIS — R748 Abnormal levels of other serum enzymes: Secondary | ICD-10-CM

## 2022-09-13 NOTE — Patient Instructions (Signed)
Medication Instructions:  NO CHANGES today   *If you need a refill on your cardiac medications before your next appointment, please call your pharmacy*   Lab Work: FASTING lab work as soon as able   If you have labs (blood work) drawn today and your tests are completely normal, you will receive your results only by: [place] (if you have MyChart) OR A paper copy in the mail If you have any lab test that is abnormal or we need to change your treatment, we will call you to review the results.   Testing/Procedures: NONE   Follow-Up: At [facility], you and your health needs are our priority.  As part of our continuing mission to provide you with exceptional heart care, we have created designated Provider Care Teams.  These Care Teams include your primary Cardiologist (physician) and Advanced Practice Providers (APPs -  Physician Assistants and Nurse Practitioners) who all work together to provide you with the care you need, when you need it.  We recommend signing up for the patient portal called "MyChart".  Sign up information is provided on this After Visit Summary.  MyChart is used to connect with patients for Virtual Visits (Telemedicine).  Patients are able to view lab/test results, encounter notes, upcoming appointments, etc.  Non-urgent messages can be sent to your provider as well.   To learn more about what you can do with MyChart, go to [email or web address].    Your next appointment:   6 month(s)  The format for your next appointment:   In Person  Provider:   Lyman Bishop MD

## 2022-09-13 NOTE — Progress Notes (Signed)
LIPID CLINIC CONSULT NOTE  Chief Complaint:  Follow-up high triglycerides  Primary Care Physician: Gordon Pollen, MD  Primary Cardiologist:  None  HPI:  Gordon Green is a 61 y.o. male who is being seen today for the evaluation of high triglyceride at the request of Gordon Green, *.  This is a pleasant 61 year old male kindly referred by Dr. Mitchel Green, for evaluation management of high triglycerides.  He recently established with his new primary care provider but has a longstanding history of high triglycerides he says dating back to at least his 18s.  He also has type 2 diabetes which appears well managed recent A1c of 5.8, hypertension and dyslipidemia.  He does report family history of heart disease including his father who had unfortunately died of an MI at age 80 and had high cholesterol.  He had previously been on fenofibrate and prior to that simvastatin Mediport apparently the simvastatin did not lower his cholesterol as well as expected.  He had also been prescribed Vascepa but was only taking 2 g twice daily.  He also made dietary changes and had increased saturated fats in his triglycerides more recently everywhere 2022 showed triglycerides of 1021.  He then had increased his Vascepa to 2 g twice daily, changed his diet and was more active and triglycerides have come down significantly to 514, however his LDL cholesterol is elevated at 108.  He is not on a statin.  He does have a history of kidney cancer and is a survivor of this.  He is never had a stroke or MI that he is aware of.  Has not had any prior cardiac testing.  He denies any symptoms such as chest pain or worsening shortness of breath.  05/31/2021  Mr. Gordon Green returns today for follow-up.  He underwent recent calcium scoring which surprisingly showed an abnormally high calcium score of 2833.  This demonstrated multivessel coronary calcium which somewhat spared the left circumflex otherwise a significant  finding.  He reports only about 3 years of diabetes therefore I think it is unlikely related to that.  Suspect there is family history of strong early onset heart disease in his father as well as dyslipidemia with high triglycerides are major risk factors.  We had a discussion today about possible stress testing however he said he did significant hike this past weekend for several hours in the [facility] and had no chest pain or significant shortness of breath.  He exercises regularly and says he is asymptomatic.  09/13/2022  Mr. Gordon Green returns today for follow-up.  He seems to be doing well.  Denies any coronary symptoms although had a high calcium score.  He has been more physically active and has lost about 10 pounds.  This will likely result in no lower cholesterol.  He is compliant with his medications and denies any side effects with this.  Of note he did have some mild elevation in the ALT in the past, probably from some fatty liver.  This will need to be monitored on combination therapy.  PMHx:  Past Medical History:  Diagnosis Date   Cancer ([place]) 02/2004   ;Hx: of right kidney cancer; kidney removed in 2005   Diabetes mellitus without complication ([place])    GERD (gastroesophageal reflux disease)    Gout    Hx: of   Hyperlipidemia    Hypertension     Past Surgical History:  Procedure Laterality Date   FRACTURE SURGERY N/A    Phreesia 12/21/2020  Fx: tibula     Plateau fx:   HERNIA REPAIR N/A    Phreesia 12/21/2020   INGUINAL HERNIA REPAIR     age 25   KIDNEY SURGERY  02/2004   Hx; of removal of right kidnety in 2005   ORIF TIBIA PLATEAU Left 08/25/2013   Procedure: OPEN REDUCTION INTERNAL FIXATION (ORIF) TIBIAL PLATEAU;  Surgeon: Renette Butters, MD;  Location: [place] [facility];  Service: Orthopedics;  Laterality: Left;    FAMHx:  Family History  Problem Relation Age of Onset   Heart disease Father    Hyperlipidemia Brother    Colon cancer Neg Hx    Esophageal cancer  Neg Hx    Prostate cancer Neg Hx    Rectal cancer Neg Hx    Stomach cancer Neg Hx    Diabetes Neg Hx     SOCHx:   reports that he quit smoking about 18 years ago. His smoking use included cigarettes. He has never used smokeless tobacco. He reports current alcohol use of about 4.0 standard drinks of alcohol per week. He reports that he does not use drugs.  ALLERGIES:  No Known Allergies  ROS: Pertinent items noted in HPI and remainder of comprehensive ROS otherwise negative.  HOME MEDS: Current Outpatient Medications on File Prior to Visit  Medication Sig Dispense Refill   acetaminophen (TYLENOL) 500 MG tablet Take 500 mg by mouth every 6 (six) hours as needed for mild pain, fever or headache.     allopurinol (ZYLOPRIM) 300 MG tablet TAKE 1 TABLET BY MOUTH EVERY DAY 30 tablet 2   Ascorbic Acid (VITAMIN C) 1000 MG tablet Take 1,000 mg by mouth daily.     aspirin EC 81 MG tablet Take 81 mg by mouth daily. Swallow whole.     atorvastatin (LIPITOR) 40 MG tablet TAKE 1 TABLET BY MOUTH EVERY DAY 90 tablet 3   blood glucose meter kit and supplies Please check your blood pressure twice a day and keep a log. 1 each 0   Cholecalciferol (VITAMIN D3) 125 MCG (5000 UT) CAPS Take 1 capsule by mouth daily.     CONTOUR NEXT TEST test strip USE AS INSTRUCTED TO TEST BLOOD SUGARS 2 TIMES DAILY. DX CODE 100 each 3   FARXIGA 10 MG TABS tablet Take 10 mg by mouth daily.     fenofibrate micronized (LOFIBRA) 134 MG capsule TAKE 1 CAPSULE BY MOUTH EVERY DAY 90 capsule 1   glucose blood (CONTOUR NEXT TEST) test strip TEST BLOOD SUGAR EVERY DAY 100 strip 3   Lancets MISC 1 each by Does not apply route 2 (two) times daily. To be used with Contour glucose meter. Dx: E11.8 200 each 0   lisinopril (ZESTRIL) 20 MG tablet Take 20 mg by mouth daily.     Magnesium 500 MG TABS Take 500 mg by mouth daily.     Menaquinone-7 (VITAMIN K2 PO) Take by mouth.     metFORMIN (GLUCOPHAGE) 1000 MG tablet TAKE 1 TABLET (1,000 MG  TOTAL) BY MOUTH TWICE A DAY WITH FOOD 180 tablet 1   omeprazole (PRILOSEC OTC) 20 MG tablet Take 20 mg by mouth daily.     Potassium Gluconate 595 MG CAPS Take 595 mg by mouth daily.     Turmeric (QC TUMERIC COMPLEX PO) Take 1 capsule by mouth as needed (1500 mg).     VASCEPA 1 g capsule TAKE 2 CAPSULES BY MOUTH TWICE A DAY 120 capsule 3   VITAMIN D PO Take 5,000 Units  by mouth daily.     No current facility-administered medications on file prior to visit.    LABS/IMAGING: No results found for this or any previous visit (from the past 48 hour(s)). No results found.  LIPID PANEL:    Component Value Date/Time   CHOL 127 06/27/2021 1049   CHOL 206 (H) 12/22/2020 1352   TRIG 352.0 (H) 06/27/2021 1049   HDL 25.20 (L) 06/27/2021 1049   HDL 21 (L) 12/22/2020 1352   CHOLHDL 5 06/27/2021 1049   VLDL 70.4 (H) 06/27/2021 1049   LDLCALC Comment (A) 12/22/2020 1352   LDLDIRECT 71.0 06/27/2021 1049    WEIGHTS: Wt Readings from Last 3 Encounters:  09/13/22 241 lb 3.2 oz (109.4 kg)  09/08/21 243 lb (110.2 kg)  06/28/21 247 lb (112 kg)    VITALS: BP 130/70   Pulse 81   Ht _0  (1.854 m)   Wt 241 lb 3.2 oz (109.4 kg)   SpO2 96%   BMI 31.82 kg/m   EXAM: Deferred  EKG: Deferred  ASSESSMENT: Mixed dyslipidemia with high triglycerides, 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  Mr. Beilke has lost 10 pounds and is more physically active.  He is compliant with his medications.  He was not able to get labs prior to this appointment but we will go ahead and reorder them including reassessment of his liver enzymes, A1c, LP(a) since this was not previously checked and there is a very high calcium score and a lipid NMR.  Plan follow-up with me in 6 months or sooner as necessary.  Pixie Casino, MD, [facility], [place] Director of the [facility] Lipid [facility] &  Cardiovascular Risk Reduction [facility] Diplomate of the American Board of Clinical Lipidology Attending Cardiologist  Direct Dial: [phone]  Fax: [phone]  Website:  [email or web address] 09/13/2022, 8:54 AM

## 2022-09-18 DIAGNOSIS — E1169 Type 2 diabetes mellitus with other specified complication: Secondary | ICD-10-CM | POA: Diagnosis not present

## 2022-09-18 DIAGNOSIS — Z8249 Family history of ischemic heart disease and other diseases of the circulatory system: Secondary | ICD-10-CM | POA: Diagnosis not present

## 2022-09-18 DIAGNOSIS — E785 Hyperlipidemia, unspecified: Secondary | ICD-10-CM | POA: Diagnosis not present

## 2022-09-18 DIAGNOSIS — R931 Abnormal findings on diagnostic imaging of heart and coronary circulation: Secondary | ICD-10-CM | POA: Diagnosis not present

## 2022-09-19 LAB — COMPREHENSIVE METABOLIC PANEL
ALT: 60 IU/L — ABNORMAL HIGH (ref 0–44)
AST: 38 IU/L (ref 0–40)
Albumin/Globulin Ratio: 1.8 (ref 1.2–2.2)
Albumin: 4.6 g/dL (ref 3.8–4.9)
Alkaline Phosphatase: 52 IU/L (ref 44–121)
BUN/Creatinine Ratio: 21 (ref 10–24)
BUN: 25 mg/dL (ref 8–27)
Bilirubin Total: 0.4 mg/dL (ref 0.0–1.2)
CO2: 23 mmol/L (ref 20–29)
Calcium: 9.8 mg/dL (ref 8.6–10.2)
Chloride: 101 mmol/L (ref 96–106)
Creatinine, Ser: 1.19 mg/dL (ref 0.76–1.27)
Globulin, Total: 2.6 g/dL (ref 1.5–4.5)
Glucose: 104 mg/dL — ABNORMAL HIGH (ref 70–99)
Potassium: 4.8 mmol/L (ref 3.5–5.2)
Sodium: 142 mmol/L (ref 134–144)
Total Protein: 7.2 g/dL (ref 6.0–8.5)
eGFR: 70 mL/min/{1.73_m2} (ref >59)

## 2022-09-19 LAB — NMR, LIPOPROFILE
Cholesterol, Total: 120 mg/dL (ref 100–199)
HDL Particle Number: 24.8 umol/L — ABNORMAL LOW (ref >=30.5)
HDL-C: 22 mg/dL — ABNORMAL LOW (ref >39)
LDL Particle Number: 929 nmol/L (ref <1000)
LDL Size: 19.8 nm — ABNORMAL LOW (ref >20.5)
LDL-C (NIH Calc): 63 mg/dL (ref 0–99)
LP-IR Score: 76 — ABNORMAL HIGH (ref <=45)
Small LDL Particle Number: 714 nmol/L — ABNORMAL HIGH (ref <=527)
Triglycerides: 210 mg/dL — ABNORMAL HIGH (ref 0–149)

## 2022-09-19 LAB — HEMOGLOBIN A1C
Est. average glucose Bld gHb Est-mCnc: 120 mg/dL
Hgb A1c MFr Bld: 5.8 % — ABNORMAL HIGH (ref 4.8–5.6)

## 2022-09-19 LAB — LIPOPROTEIN A (LPA): Lipoprotein (a): 114.1 nmol/L — ABNORMAL HIGH (ref <75.0)

## 2022-10-02 ENCOUNTER — Other Ambulatory Visit: Payer: Self-pay | Admitting: Endocrinology

## 2022-10-12 ENCOUNTER — Other Ambulatory Visit: Payer: Self-pay | Admitting: Endocrinology

## 2022-10-15 ENCOUNTER — Other Ambulatory Visit: Payer: Self-pay | Admitting: Endocrinology

## 2022-10-15 DIAGNOSIS — N182 Chronic kidney disease, stage 2 (mild): Secondary | ICD-10-CM | POA: Diagnosis not present

## 2022-10-17 ENCOUNTER — Other Ambulatory Visit: Payer: Self-pay | Admitting: Emergency Medicine

## 2022-10-17 DIAGNOSIS — E118 Type 2 diabetes mellitus with unspecified complications: Secondary | ICD-10-CM

## 2022-11-12 NOTE — Progress Notes (Deleted)
    Subjective:    Patient ID: Gordon Green, male    DOB: 03-30-1961, 62 y.o.   MRN: [id number]      HPI Trevonne is here for No chief complaint on file.     He is here for an acute visit for cold symptoms.  His symptoms started  He is experiencing   He has tried taking    Medications and allergies reviewed with patient and updated if appropriate.  Current Outpatient Medications on File Prior to Visit  Medication Sig Dispense Refill   acetaminophen (TYLENOL) 500 MG tablet Take 500 mg by mouth every 6 (six) hours as needed for mild pain, fever or headache.     allopurinol (ZYLOPRIM) 300 MG tablet TAKE 1 TABLET BY MOUTH EVERY DAY 90 tablet 2   Ascorbic Acid (VITAMIN C) 1000 MG tablet Take 1,000 mg by mouth daily.     aspirin EC 81 MG tablet Take 81 mg by mouth daily. Swallow whole.     atorvastatin (LIPITOR) 40 MG tablet TAKE 1 TABLET BY MOUTH EVERY DAY 90 tablet 3   blood glucose meter kit and supplies Please check your blood pressure twice a day and keep a log. 1 each 0   Cholecalciferol (VITAMIN D3) 125 MCG (5000 UT) CAPS Take 1 capsule by mouth daily.     CONTOUR NEXT TEST test strip USE AS INSTRUCTED TO TEST BLOOD SUGARS 2 TIMES DAILY. DX CODE 100 each 3   FARXIGA 10 MG TABS tablet Take 10 mg by mouth daily.     fenofibrate micronized (LOFIBRA) 134 MG capsule TAKE 1 CAPSULE BY MOUTH EVERY DAY 90 capsule 1   glucose blood (CONTOUR NEXT TEST) test strip TEST BLOOD SUGAR EVERY DAY 100 strip 3   Lancets MISC 1 each by Does not apply route 2 (two) times daily. To be used with Contour glucose meter. Dx: E11.8 200 each 0   lisinopril (ZESTRIL) 20 MG tablet Take 20 mg by mouth daily.     Magnesium 500 MG TABS Take 500 mg by mouth daily.     Menaquinone-7 (VITAMIN K2 PO) Take by mouth.     metFORMIN (GLUCOPHAGE) 1000 MG tablet TAKE 1 TABLET (1,000 MG TOTAL) BY MOUTH TWICE A DAY WITH FOOD 180 tablet 1   omeprazole (PRILOSEC OTC) 20 MG tablet Take 20 mg by mouth daily.      Potassium Gluconate 595 MG CAPS Take 595 mg by mouth daily.     Turmeric (QC TUMERIC COMPLEX PO) Take 1 capsule by mouth as needed (1500 mg).     VASCEPA 1 g capsule TAKE 2 CAPSULES BY MOUTH TWICE A DAY 120 capsule 3   VITAMIN D PO Take 5,000 Units by mouth daily.     No current facility-administered medications on file prior to visit.    Review of Systems     Objective:  There were no vitals filed for this visit. BP Readings from Last 3 Encounters:  09/13/22 130/70  09/08/21 126/76  06/28/21 137/72   Wt Readings from Last 3 Encounters:  09/13/22 241 lb 3.2 oz (109.4 kg)  09/08/21 243 lb (110.2 kg)  06/28/21 247 lb (112 kg)   There is no height or weight on file to calculate BMI.    Physical Exam         Assessment & Plan:    See Problem List for Assessment and Plan of chronic medical problems.

## 2022-11-13 ENCOUNTER — Ambulatory Visit
Admission: EM | Admit: 2022-11-13 | Discharge: 2022-11-13 | Disposition: A | Discharge status: Home / Self Care | Payer: BC Managed Care – PPO | Attending: Emergency Medicine | Admitting: Emergency Medicine

## 2022-11-13 ENCOUNTER — Ambulatory Visit: Payer: BC Managed Care – PPO | Admitting: Internal Medicine

## 2022-11-13 DIAGNOSIS — R051 Acute cough: Secondary | ICD-10-CM

## 2022-11-13 DIAGNOSIS — J0100 Acute maxillary sinusitis, unspecified: Secondary | ICD-10-CM | POA: Diagnosis not present

## 2022-11-13 MED ORDER — AMOXICILLIN 875 MG PO TABS: 875.0000 mg | ORAL_TABLET | Freq: Two times a day (BID) | ORAL | 0 refills | Status: AC

## 2022-11-13 NOTE — Discharge Instructions (Signed)
Take the amoxicillin as directed.  Follow up with your primary care provider if your symptoms are not improving.   ° ° °

## 2022-11-13 NOTE — ED Provider Notes (Signed)
Gordon Green    CSN: [id number] Arrival date & time: 11/13/22  1606      History   Chief Complaint Chief Complaint  Patient presents with   Cough    HPI Gordon Green is a 62 y.o. male.  Patient presents with 5 day history of congestion, sinus pressure, sore throat, cough.  His nasal discharge is yellow and occasionally blood-tinged.  No fever, chest pain, shortness of breath, or other symptoms.  Treating with Afrin nasal spray and Nyquil.  His medical history includes hypertension, hyperlipidemia, diabetes, renal cancer, removal of right kidney in 2005.    The history is provided by the patient and medical records.    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   Renal cancer ([place]) 04/24/2015    Past Surgical History:  Procedure Laterality Date   FRACTURE SURGERY N/A    [facility]reesia 12/21/2020   Fx: tibula     Plateau fx:   HERNIA REPAIR N/A    Phreesia 12/21/2020   INGUINAL HERNIA REPAIR     age 89   KIDNEY SURGERY  02/2004   Hx; of removal of right kidnety in 2005   ORIF TIBIA PLATEAU Left 08/25/2013   Procedure: OPEN REDUCTION INTERNAL FIXATION (ORIF) TIBIAL PLATEAU;  Surgeon: Renette Butters, MD;  Location: [place];  Service: Orthopedics;  Laterality: Left;       Home Medications    Prior to Admission medications   Medication Sig Start Date End Date Taking? Authorizing Provider  amoxicillin (AMOXIL) 875 MG tablet Take 1 tablet (875 mg total) by mouth 2  (two) times daily for 10 days. 11/13/22 11/23/22 Yes Sharion Balloon, NP  acetaminophen (TYLENOL) 500 MG tablet Take 500 mg by mouth every 6 (six) hours as needed for mild pain, fever or headache.    [provider]  allopurinol (ZYLOPRIM) 300 MG tablet TAKE 1 TABLET BY MOUTH EVERY DAY 10/12/22   Elayne Snare, MD  Ascorbic Acid (VITAMIN C) 1000 MG tablet Take 1,000 mg by mouth daily.    [provider]  aspirin EC 81 MG tablet Take 81 mg by mouth daily. Swallow whole.    [provider]  atorvastatin (LIPITOR) 40 MG tablet TAKE 1 TABLET BY MOUTH EVERY DAY 05/21/22   Hilty, Nadean Corwin, MD  blood glucose meter kit and supplies Please check your blood pressure twice a day and keep a log. 11/13/17   Jola Schmidt, MD  Cholecalciferol (VITAMIN D3) 125 MCG (5000 UT) CAPS Take 1 capsule by mouth daily.    [provider]  CONTOUR NEXT TEST test strip USE AS INSTRUCTED TO TEST BLOOD SUGARS 2 TIMES DAILY. DX CODE 04/28/18   Elayne Snare, MD  FARXIGA 10 MG TABS tablet Take 10 mg by mouth daily. 04/07/21   [provider]  fenofibrate micronized (LOFIBRA) 134 MG capsule TAKE 1 CAPSULE BY MOUTH EVERY DAY 10/02/22   Elayne Snare, MD  glucose  blood (CONTOUR NEXT TEST) test strip TEST BLOOD SUGAR EVERY DAY 04/24/22   Elayne Snare, MD  Lancets MISC 1 each by Does not apply route 2 (two) times daily. To be used with Contour glucose meter. Dx: E11.8 12/31/17   Elayne Snare, MD  lisinopril (ZESTRIL) 20 MG tablet Take 20 mg by mouth daily.    [provider]  Magnesium 500 MG TABS Take 500 mg by mouth daily.    [provider]  Menaquinone-7 (VITAMIN K2 PO) Take by mouth.    [provider]  metFORMIN (GLUCOPHAGE) 1000 MG tablet TAKE 1 TABLET (1,000 MG TOTAL) BY MOUTH TWICE A DAY WITH FOOD 10/17/22   Horald Pollen, MD  omeprazole (PRILOSEC OTC) 20 MG tablet Take 20 mg by mouth daily.    [provider]  Potassium Gluconate 595 MG CAPS Take 595 mg by  mouth daily.    [provider]  Turmeric (QC TUMERIC COMPLEX PO) Take 1 capsule by mouth as needed (1500 mg).    [provider]  VASCEPA 1 g capsule TAKE 2 CAPSULES BY MOUTH TWICE A DAY 10/15/22   Elayne Snare, MD  VITAMIN D PO Take 5,000 Units by mouth daily.    [provider]    Family History Family History  Problem Relation Age of Onset   Heart disease Father    Hyperlipidemia Brother    Colon cancer Neg Hx    Esophageal cancer Neg Hx    Prostate cancer Neg Hx    Rectal cancer Neg Hx    Stomach cancer Neg Hx    Diabetes Neg Hx     Social History Social History   Tobacco Use   Smoking status: Former    Types: Cigarettes    Quit date: 03/24/2004    Years since quitting: 18.6   Smokeless tobacco: Never   Tobacco comments:    quit smoking cigarettes in 2005  Substance Use Topics   Alcohol use: Yes    Alcohol/week: 4.0 standard drinks of alcohol    Types: 2 Cans of beer, 2 Standard drinks or equivalent per week   Drug use: No     Allergies   Patient has no known allergies.   Review of Systems Review of Systems  Constitutional:  Negative for chills and fever.  HENT:  Positive for congestion, postnasal drip, rhinorrhea, sinus pressure and sore throat. Negative for ear pain.   Respiratory:  Positive for cough. Negative for shortness of breath.   Cardiovascular:  Negative for chest pain and palpitations.  Gastrointestinal:  Negative for diarrhea and vomiting.  Musculoskeletal:  Negative for back pain.  Skin:  Negative for color change and rash.  All other systems reviewed and are negative.    Physical Exam Triage Vital Signs ED Triage Vitals  Enc Vitals Group     BP      Pulse      Resp      Temp      Temp src      SpO2      Weight      Height      Head Circumference      Peak Flow      Pain Score      Pain Loc      Pain Edu?      Excl. in [place]?    No data found.  Updated Vital Signs BP 125/84   Pulse 100   Temp 99.1  F (37.3 C)  Resp 18   Ht 6' (1.829 m)   Wt 234 lb (106.1 kg)   SpO2 96%   BMI 31.74 kg/m   Visual Acuity Right Eye Distance:   Left Eye Distance:   Bilateral Distance:    Right Eye Near:   Left Eye Near:    Bilateral Near:     Physical Exam Vitals and nursing note reviewed.  Constitutional:      General: He is not in acute distress.    Appearance: Normal appearance. He is well-developed. He is not ill-appearing.  HENT:     Right Ear: Tympanic membrane normal.     Left Ear: Tympanic membrane normal.     Nose: Congestion and rhinorrhea present.     Mouth/Throat:     Mouth: Mucous membranes are moist.     Pharynx: Oropharynx is clear.  Cardiovascular:     Rate and Rhythm: Normal rate and regular rhythm.     Heart sounds: Normal heart sounds.  Pulmonary:     Effort: Pulmonary effort is normal. No respiratory distress.     Breath sounds: Normal breath sounds. No wheezing, rhonchi or rales.  Musculoskeletal:     Cervical back: Neck supple.  Skin:    General: Skin is warm and dry.  Neurological:     Mental Status: He is alert.  Psychiatric:        Mood and Affect: Mood normal.        Behavior: Behavior normal.      UC Treatments / Results  Labs (all labs ordered are listed, but only abnormal results are displayed) Labs Reviewed - No data to display  EKG   Radiology No results found.  Procedures Procedures (including critical care time)  Medications Ordered in UC Medications - No data to display  Initial Impression / Assessment and Plan / UC Course  I have reviewed the triage vital signs and the nursing notes.  Pertinent labs & imaging results that were available during my care of the patient were reviewed by me and considered in my medical decision making (see chart for details).    Acute sinusitis, cough.  Patient declines CXR.  Treating with amoxicillin.  Discussed symptomatic treatment including Tylenol, rest, hydration.  Instructed patient to  follow up with his PCP if his symptoms are not improving.  He agrees to plan of care.   Final Clinical Impressions(s) / UC Diagnoses   Final diagnoses:  Acute non-recurrent maxillary sinusitis  Acute cough     Discharge Instructions      Take the amoxicillin as directed.  Follow up with your primary care provider if your symptoms are not improving.        ED Prescriptions     Medication Sig Dispense Auth. Provider   amoxicillin (AMOXIL) 875 MG tablet Take 1 tablet (875 mg total) by mouth 2 (two) times daily for 10 days. 20 tablet Sharion Balloon, NP      I have reviewed the PDMP during this encounter.   Sharion Balloon, NP 11/13/22 [phone]

## 2022-11-13 NOTE — ED Triage Notes (Addendum)
Patient to Urgent Care with complaints of chest congestion, cough, sinus pressure/ pain. Reports drainage into throat. Reports nasal drainage is yellow/ blood tinged.   Symptoms started three days ago. Reports concern due to hx of pneumonia and feeling like congestion as settled into his chest.  Temp last night 99.5, reports waking up after with cold sweats.   Has been taking nyquil, afrin.

## 2022-11-16 ENCOUNTER — Ambulatory Visit: Payer: BC Managed Care – PPO | Admitting: Internal Medicine

## 2022-12-11 ENCOUNTER — Encounter: Payer: Self-pay | Admitting: Emergency Medicine

## 2022-12-11 ENCOUNTER — Ambulatory Visit (INDEPENDENT_AMBULATORY_CARE_PROVIDER_SITE_OTHER): Payer: BC Managed Care – PPO | Admitting: Emergency Medicine

## 2022-12-11 VITALS — BP 120/72 | HR 74 | Temp 98.3°F | Ht 72.0 in | Wt 233.5 lb

## 2022-12-11 DIAGNOSIS — Z125 Encounter for screening for malignant neoplasm of prostate: Secondary | ICD-10-CM

## 2022-12-11 DIAGNOSIS — I152 Hypertension secondary to endocrine disorders: Secondary | ICD-10-CM | POA: Diagnosis not present

## 2022-12-11 DIAGNOSIS — E1169 Type 2 diabetes mellitus with other specified complication: Secondary | ICD-10-CM | POA: Diagnosis not present

## 2022-12-11 DIAGNOSIS — Z114 Encounter for screening for human immunodeficiency virus [HIV]: Secondary | ICD-10-CM

## 2022-12-11 DIAGNOSIS — Z13228 Encounter for screening for other metabolic disorders: Secondary | ICD-10-CM

## 2022-12-11 DIAGNOSIS — Z13220 Encounter for screening for lipoid disorders: Secondary | ICD-10-CM

## 2022-12-11 DIAGNOSIS — Z0000 Encounter for general adult medical examination without abnormal findings: Secondary | ICD-10-CM

## 2022-12-11 DIAGNOSIS — Z130 Encounter for screening for diseases of the blood and blood-forming organs and certain disorders involving the immune mechanism: Secondary | ICD-10-CM | POA: Diagnosis not present

## 2022-12-11 DIAGNOSIS — C649 Malignant neoplasm of unspecified kidney, except renal pelvis: Secondary | ICD-10-CM | POA: Diagnosis not present

## 2022-12-11 DIAGNOSIS — E785 Hyperlipidemia, unspecified: Secondary | ICD-10-CM

## 2022-12-11 DIAGNOSIS — Z1329 Encounter for screening for other suspected endocrine disorder: Secondary | ICD-10-CM

## 2022-12-11 DIAGNOSIS — F5104 Psychophysiologic insomnia: Secondary | ICD-10-CM

## 2022-12-11 DIAGNOSIS — E1159 Type 2 diabetes mellitus with other circulatory complications: Secondary | ICD-10-CM | POA: Diagnosis not present

## 2022-12-11 LAB — COMPREHENSIVE METABOLIC PANEL
ALT: 46 U/L (ref 0–53)
AST: 31 U/L (ref 0–37)
Albumin: 4.8 g/dL (ref 3.5–5.2)
Alkaline Phosphatase: 47 U/L (ref 39–117)
BUN: 27 mg/dL — ABNORMAL HIGH (ref 6–23)
CO2: 25 mEq/L (ref 19–32)
Calcium: 9.8 mg/dL (ref 8.4–10.5)
Chloride: 102 mEq/L (ref 96–112)
Creatinine, Ser: 1.28 mg/dL (ref 0.40–1.50)
GFR: 60.7 mL/min (ref >60.00)
Glucose, Bld: 85 mg/dL (ref 70–99)
Potassium: 4.2 mEq/L (ref 3.5–5.1)
Sodium: 140 mEq/L (ref 135–145)
Total Bilirubin: 0.5 mg/dL (ref 0.2–1.2)
Total Protein: 7.3 g/dL (ref 6.0–8.3)

## 2022-12-11 LAB — HEMOGLOBIN A1C: Hgb A1c MFr Bld: 5.9 % (ref 4.6–6.5)

## 2022-12-11 LAB — CBC WITH DIFFERENTIAL/PLATELET
Basophils Absolute: 0.1 10*3/uL (ref 0.0–0.1)
Basophils Relative: 1.2 % (ref 0.0–3.0)
Eosinophils Absolute: 0.2 10*3/uL (ref 0.0–0.7)
Eosinophils Relative: 2 % (ref 0.0–5.0)
HCT: 43.9 % (ref 39.0–52.0)
Hemoglobin: 14.8 g/dL (ref 13.0–17.0)
Lymphocytes Relative: 28 % (ref 12.0–46.0)
Lymphs Abs: 2.4 10*3/uL (ref 0.7–4.0)
MCHC: 33.8 g/dL (ref 30.0–36.0)
MCV: 89.8 fl (ref 78.0–100.0)
Monocytes Absolute: 0.6 10*3/uL (ref 0.1–1.0)
Monocytes Relative: 7.2 % (ref 3.0–12.0)
Neutro Abs: 5.3 10*3/uL (ref 1.4–7.7)
Neutrophils Relative %: 61.6 % (ref 43.0–77.0)
Platelets: 234 10*3/uL (ref 150.0–400.0)
RBC: 4.89 Mil/uL (ref 4.22–5.81)
RDW: 14.5 % (ref 11.5–15.5)
WBC: 8.6 10*3/uL (ref 4.0–10.5)

## 2022-12-11 LAB — PSA: PSA: 0.27 ng/mL (ref 0.10–4.00)

## 2022-12-11 MED ORDER — ZOLPIDEM TARTRATE 10 MG PO TABS: 10.0000 mg | ORAL_TABLET | Freq: Every evening | ORAL | 1 refills | Status: AC | PRN

## 2022-12-11 NOTE — Progress Notes (Signed)
[name] 62 y.o.   Chief Complaint  Patient presents with   Annual Exam    Patient is having trouble sleeping.     HISTORY OF PRESENT ILLNESS: This is a 62 y.o. male here for annual exam. Stressful period, work related.  Interfering with sleep creating insomnia. No other complaints or medical concerns today.  HPI   Prior to Admission medications   Medication Sig Start Date End Date Taking? Authorizing Provider  acetaminophen (TYLENOL) 500 MG tablet Take 500 mg by mouth every 6 (six) hours as needed for mild pain, fever or headache.   Yes [provider]  allopurinol (ZYLOPRIM) 300 MG tablet TAKE 1 TABLET BY MOUTH EVERY DAY 10/12/22  Yes Elayne Snare, MD  Ascorbic Acid (VITAMIN C) 1000 MG tablet Take 1,000 mg by mouth daily.   Yes [provider]  aspirin EC 81 MG tablet Take 81 mg by mouth daily. Swallow whole.   Yes [provider]  atorvastatin (LIPITOR) 40 MG tablet TAKE 1 TABLET BY MOUTH EVERY DAY 05/21/22  Yes Hilty, Nadean Corwin, MD  blood glucose meter kit and supplies Please check your blood pressure twice a day and keep a log. 11/13/17  Yes Jola Schmidt, MD  CONTOUR NEXT TEST test strip USE AS INSTRUCTED TO TEST BLOOD SUGARS 2 TIMES DAILY. DX CODE 04/28/18  Yes Elayne Snare, MD  FARXIGA 10 MG TABS tablet Take 10 mg by mouth daily. 04/07/21  Yes [provider]  fenofibrate micronized (LOFIBRA) 134 MG capsule TAKE 1 CAPSULE BY MOUTH EVERY DAY 10/02/22  Yes Elayne Snare, MD  glucose blood (CONTOUR NEXT TEST) test strip TEST BLOOD SUGAR EVERY DAY 04/24/22  Yes Elayne Snare, MD  Lancets MISC 1 each by Does not apply route 2 (two) times daily. To be used with Contour glucose meter. Dx: E11.8 12/31/17  Yes Elayne Snare, MD  lisinopril (ZESTRIL) 20 MG tablet Take 20 mg by mouth daily.   Yes [provider]  Menaquinone-7 (VITAMIN K2 PO) Take by mouth.   Yes [provider]  metFORMIN (GLUCOPHAGE) 1000 MG tablet TAKE 1 TABLET (1,000 MG  TOTAL) BY MOUTH TWICE A DAY WITH FOOD 10/17/22  Yes Kechia Yahnke, Ines Bloomer, MD  omeprazole (PRILOSEC OTC) 20 MG tablet Take 20 mg by mouth daily.   Yes [provider]  Turmeric (QC TUMERIC COMPLEX PO) Take 1 capsule by mouth as needed (1500 mg).   Yes [provider]  VASCEPA 1 g capsule TAKE 2 CAPSULES BY MOUTH TWICE A DAY 10/15/22  Yes Elayne Snare, MD  Cholecalciferol (VITAMIN D3) 125 MCG (5000 UT) CAPS Take 1 capsule by mouth daily. Patient not taking: Reported on 12/11/2022    [provider]    No Known Allergies  Patient Active Problem List   Diagnosis Date Noted   Hypertension associated with diabetes ([facility]) 12/22/2020   Mixed hyperlipidemia 08/08/2018   History of gout 08/08/2018   Dyslipidemia associated with type 2 diabetes mellitus ([facility]) 12/13/2017   Solitary left kidney 08/27/2016   Renal cancer ([facility]) 04/24/2015    Past Medical History:  Diagnosis Date   Cancer ([place]) 02/2004   ;Hx: of right kidney cancer; kidney removed in 2005   Diabetes mellitus without complication ([facility])    GERD (gastroesophageal reflux disease)    Gout    Hx: of   Hyperlipidemia    Hypertension     Past Surgical History:  Procedure Laterality Date   FRACTURE SURGERY N/A    [employer]  12/21/2020   Fx: tibula     Plateau fx:   HERNIA REPAIR N/A    [employer] 12/21/2020   INGUINAL HERNIA REPAIR     age 23   KIDNEY SURGERY  02/2004   Hx; of removal of right kidnety in 2005   ORIF TIBIA PLATEAU Left 08/25/2013   Procedure: OPEN REDUCTION INTERNAL FIXATION (ORIF) TIBIAL PLATEAU;  Surgeon: Renette Butters, MD;  Location: [facility];  Service: Orthopedics;  Laterality: Left;    Social History   Socioeconomic History   Marital status: Married    Spouse name: Not on file   Number of children: Not on file   Years of education: Not on file   Highest education level: Not on file  Occupational History   Not on file  Tobacco Use   Smoking status: Former    Types: Cigarettes     Quit date: 03/24/2004    Years since quitting: 18.7   Smokeless tobacco: Never   Tobacco comments:    quit smoking cigarettes in 2005  Substance and Sexual Activity   Alcohol use: Yes    Alcohol/week: 4.0 standard drinks of alcohol    Types: 2 Cans of beer, 2 Standard drinks or equivalent per week   Drug use: No   Sexual activity: Not on file  Other Topics Concern   Not on file  Social History Narrative   Not on file   Social Determinants of Health   Financial Resource Strain: Not on file  Food Insecurity: Not on file  Transportation Needs: Not on file  Physical Activity: Not on file  Stress: Not on file  Social Connections: Not on file  Intimate Partner Violence: Not on file    Family History  Problem Relation Age of Onset   Heart disease Father    Hyperlipidemia Brother    Colon cancer Neg Hx    Esophageal cancer Neg Hx    Prostate cancer Neg Hx    Rectal cancer Neg Hx    Stomach cancer Neg Hx    Diabetes Neg Hx      Review of Systems  Constitutional: Negative.  Negative for chills and fever.  HENT: Negative.  Negative for congestion and sore throat.   Respiratory: Negative.  Negative for cough and shortness of breath.   Cardiovascular: Negative.  Negative for chest pain and palpitations.  Gastrointestinal:  Negative for abdominal pain, diarrhea, nausea and vomiting.  Genitourinary: Negative.   Skin: Negative.  Negative for rash.  Neurological: Negative.  Negative for dizziness and headaches.  Psychiatric/Behavioral:  The patient has insomnia.   All other systems reviewed and are negative.   Today's Vitals   12/11/22 1441  BP: 120/72  Pulse: 74  Temp: 98.3 F (36.8 C)  TempSrc: Oral  SpO2: 95%  Weight: 233 lb 8 oz (105.9 kg)  Height: 6' (1.829 m)   Body mass index is 31.67 kg/m. Wt Readings from Last 3 Encounters:  12/11/22 233 lb 8 oz (105.9 kg)  11/13/22 234 lb (106.1 kg)  09/13/22 241 lb 3.2 oz (109.4 kg)    Physical Exam Vitals  reviewed.  Constitutional:      Appearance: Normal appearance.  HENT:     Head: Normocephalic.     Right Ear: Tympanic membrane, ear canal and external ear normal.     Left Ear: Tympanic membrane, ear canal and external ear normal.     Mouth/Throat:     Mouth: Mucous membranes are moist.     Pharynx:  Oropharynx is clear.  Eyes:     Extraocular Movements: Extraocular movements intact.     Conjunctiva/sclera: Conjunctivae normal.     Pupils: Pupils are equal, round, and reactive to light.  Cardiovascular:     Rate and Rhythm: Normal rate and regular rhythm.     Pulses: Normal pulses.     Heart sounds: Normal heart sounds.  Pulmonary:     Effort: Pulmonary effort is normal.     Breath sounds: Normal breath sounds.  Abdominal:     Palpations: Abdomen is soft.     Tenderness: There is no abdominal tenderness.  Musculoskeletal:     Cervical back: No tenderness.  Lymphadenopathy:     Cervical: No cervical adenopathy.  Skin:    General: Skin is warm and dry.  Neurological:     General: No focal deficit present.     Mental Status: He is alert and oriented to person, place, and time.  Psychiatric:        Mood and Affect: Mood normal.        Behavior: Behavior normal.      ASSESSMENT & PLAN: Problem List Items Addressed This Visit       Cardiovascular and Mediastinum   Hypertension associated with diabetes ([facility])   Relevant Orders   Comprehensive metabolic panel   Hemoglobin A1c     Endocrine   Dyslipidemia associated with type 2 diabetes mellitus ([place])   Relevant Orders   Hemoglobin A1c     Genitourinary   Renal cancer ([place])   Other Visit Diagnoses     Routine general medical examination at a health care facility    -  Primary   Relevant Orders   CBC with Differential   Comprehensive metabolic panel   Hemoglobin A1c   PSA(Must document that pt has been informed of limitations of PSA testing.)   HIV antibody   Psychophysiological insomnia       Relevant  Medications   zolpidem (AMBIEN) 10 MG tablet   Prostate cancer screening       Relevant Orders   PSA(Must document that pt has been informed of limitations of PSA testing.)   Screening for HIV (human immunodeficiency virus)       Relevant Orders   HIV antibody   Screening for deficiency anemia       Relevant Orders   CBC with Differential   Screening for lipoid disorders       Screening for endocrine, metabolic and immunity disorder          Modifiable risk factors discussed with patient. Anticipatory guidance according to age provided. The following topics were also discussed: Social Determinants of Health Smoking.  Non-smoker Diet and nutrition need to decrease amount of daily carbohydrate intake and daily calories and increase amount of plant-based protein in his diet Benefits of exercise Cancer screening and review of most recent colonoscopy report from 2016. Vaccinations reviewed and recommendations Cardiovascular risk assessment Review of multiple chronic medical conditions under management Review of all medications Diagnosis of insomnia, management and medications Mental health including depression and anxiety Fall and accident prevention  Problem List Items Addressed This Visit       Cardiovascular and Mediastinum   Hypertension associated with diabetes ([place])   Relevant Orders   Comprehensive metabolic panel   Hemoglobin A1c     Endocrine   Dyslipidemia associated with type 2 diabetes mellitus ([place])   Relevant Orders   Hemoglobin A1c     Genitourinary   Renal  cancer [facility]   Other Visit Diagnoses     Routine general medical examination at a health care facility    -  Primary   Relevant Orders   CBC with Differential   Comprehensive metabolic panel   Hemoglobin A1c   PSA(Must document that pt has been informed of limitations of PSA testing.)   HIV antibody   Psychophysiological insomnia       Relevant Medications   zolpidem (AMBIEN) 10 MG tablet    Prostate cancer screening       Relevant Orders   PSA(Must document that pt has been informed of limitations of PSA testing.)   Screening for HIV (human immunodeficiency virus)       Relevant Orders   HIV antibody   Screening for deficiency anemia       Relevant Orders   CBC with Differential   Screening for lipoid disorders       Screening for endocrine, metabolic and immunity disorder            Agustina Caroli, MD [facility]

## 2022-12-11 NOTE — Patient Instructions (Signed)
Health Maintenance, Male Adopting a healthy lifestyle and getting preventive care are important in promoting health and wellness. Ask your health care provider about: The right schedule for you to have regular tests and exams. Things you can do on your own to prevent diseases and keep yourself healthy. What should I know about diet, weight, and exercise? Eat a healthy diet  Eat a diet that includes plenty of vegetables, fruits, low-fat dairy products, and lean protein. Do not eat a lot of foods that are high in solid fats, added sugars, or sodium. Maintain a healthy weight Body mass index (BMI) is a measurement that can be used to identify possible weight problems. It estimates body fat based on height and weight. Your health care provider can help determine your BMI and help you achieve or maintain a healthy weight. Get regular exercise Get regular exercise. This is one of the most important things you can do for your health. Most adults should: Exercise for at least 150 minutes each week. The exercise should increase your heart rate and make you sweat (moderate-intensity exercise). Do strengthening exercises at least twice a week. This is in addition to the moderate-intensity exercise. Spend less time sitting. Even light physical activity can be beneficial. Watch cholesterol and blood lipids Have your blood tested for lipids and cholesterol at 61 years of age, then have this test every 5 years. You may need to have your cholesterol levels checked more often if: Your lipid or cholesterol levels are high. You are older than 61 years of age. You are at high risk for heart disease. What should I know about cancer screening? Many types of cancers can be detected early and may often be prevented. Depending on your health history and family history, you may need to have cancer screening at various ages. This may include screening for: Colorectal cancer. Prostate cancer. Skin cancer. Lung  cancer. What should I know about heart disease, diabetes, and high blood pressure? Blood pressure and heart disease High blood pressure causes heart disease and increases the risk of stroke. This is more likely to develop in people who have high blood pressure readings or are overweight. Talk with your health care provider about your target blood pressure readings. Have your blood pressure checked: Every 3-5 years if you are 18-39 years of age. Every year if you are 40 years old or older. If you are between the ages of 65 and 75 and are a current or former smoker, ask your health care provider if you should have a one-time screening for abdominal aortic aneurysm (AAA). Diabetes Have regular diabetes screenings. This checks your fasting blood sugar level. Have the screening done: Once every three years after age 45 if you are at a normal weight and have a low risk for diabetes. More often and at a younger age if you are overweight or have a high risk for diabetes. What should I know about preventing infection? Hepatitis B If you have a higher risk for hepatitis B, you should be screened for this virus. Talk with your health care provider to find out if you are at risk for hepatitis B infection. Hepatitis C Blood testing is recommended for: Everyone born from 1945 through 1965. Anyone with known risk factors for hepatitis C. Sexually transmitted infections (STIs) You should be screened each year for STIs, including gonorrhea and chlamydia, if: You are sexually active and are younger than 61 years of age. You are older than 61 years of age and your   health care provider tells you that you are at risk for this type of infection. Your sexual activity has changed since you were last screened, and you are at increased risk for chlamydia or gonorrhea. Ask your health care provider if you are at risk. Ask your health care provider about whether you are at high risk for HIV. Your health care provider  may recommend a prescription medicine to help prevent HIV infection. If you choose to take medicine to prevent HIV, you should first get tested for HIV. You should then be tested every 3 months for as long as you are taking the medicine. Follow these instructions at home: Alcohol use Do not drink alcohol if your health care provider tells you not to drink. If you drink alcohol: Limit how much you have to 0-2 drinks a day. Know how much alcohol is in your drink. In the [place] one drink equals one 12 oz bottle of beer (355 mL), one 5 oz glass of wine (148 mL), or one 1 oz glass of hard liquor (44 mL). Lifestyle Do not use any products that contain nicotine or tobacco. These products include cigarettes, chewing tobacco, and vaping devices, such as e-cigarettes. If you need help quitting, ask your health care provider. Do not use street drugs. Do not share needles. Ask your health care provider for help if you need support or information about quitting drugs. General instructions Schedule regular health, dental, and eye exams. Stay current with your vaccines. Tell your health care provider if: You often feel depressed. You have ever been abused or do not feel safe at home. Summary Adopting a healthy lifestyle and getting preventive care are important in promoting health and wellness. Follow your health care provider's instructions about healthy diet, exercising, and getting tested or screened for diseases. Follow your health care provider's instructions on monitoring your cholesterol and blood pressure. This information is not intended to replace advice given to you by your health care provider. Make sure you discuss any questions you have with your health care provider. Document Revised: 03/13/2021 Document Reviewed: 03/13/2021 Elsevier Patient Education  2023 [employer]  

## 2022-12-12 LAB — HIV ANTIBODY (ROUTINE TESTING W REFLEX): HIV 1&2 Ab, 4th Generation: NONREACTIVE

## 2022-12-19 ENCOUNTER — Other Ambulatory Visit (INDEPENDENT_AMBULATORY_CARE_PROVIDER_SITE_OTHER): Payer: BC Managed Care – PPO

## 2022-12-19 ENCOUNTER — Other Ambulatory Visit: Payer: Self-pay | Admitting: Endocrinology

## 2022-12-19 DIAGNOSIS — E1169 Type 2 diabetes mellitus with other specified complication: Secondary | ICD-10-CM

## 2022-12-19 DIAGNOSIS — E669 Obesity, unspecified: Secondary | ICD-10-CM

## 2022-12-19 LAB — GLUCOSE, RANDOM: Glucose, Bld: 111 mg/dL — ABNORMAL HIGH (ref 70–99)

## 2022-12-20 LAB — FRUCTOSAMINE: Fructosamine: 211 umol/L (ref 0–285)

## 2022-12-26 ENCOUNTER — Ambulatory Visit: Payer: BC Managed Care – PPO | Admitting: Endocrinology

## 2022-12-26 ENCOUNTER — Encounter: Payer: Self-pay | Admitting: Endocrinology

## 2022-12-26 VITALS — BP 124/70 | HR 95 | Ht 72.0 in | Wt 233.6 lb

## 2022-12-26 DIAGNOSIS — E1169 Type 2 diabetes mellitus with other specified complication: Secondary | ICD-10-CM | POA: Diagnosis not present

## 2022-12-26 DIAGNOSIS — E669 Obesity, unspecified: Secondary | ICD-10-CM

## 2022-12-26 NOTE — Progress Notes (Signed)
Patient ID: Gordon Green, male   DOB: [birth year], 62 y.o.   MRN: [id number]          Reason for Appointment: Follow-up for various problems   History of Present Illness:          Date of diagnosis of type 2 diabetes mellitus: 11/2017        Background history:       He had presented to his urgent care center with symptoms of excessive thirst, urination, fatigue and blurred vision starting in late December following a significant respiratory infection His blood sugar was markedly increased and he was referred for admission, however was treated in the emergency room for his blood sugar of 409 with IV fluids and an injection of insulin.  He did not have any renal function abnormalities or acidosis He was sent home on metformin 1 g twice a day  His baseline A1c at diagnosis was 11.6   Recent history:  Non-insulin hypoglycemic drugs the patient is taking are: Metformin 1000 at breakfast and 1 g dinner, Farxiga 10 mg daily  A1c is about the same at 5.9   Current management, blood sugar patterns and problems identified: Blood sugar monitoring has been infrequent with only 5 readings in the last month He is generally not checking readings after dinner with only 1 reading around 7 PM of 96 However morning readings are recently fairly good and lab glucose was 111 He does feel that he is done generally fairly well with diet although his getting some carbohydrates with his meals now Overall has been able to continue his walking and generally very active Since about 11/23 he appears to have lost about 8 pounds He does try to take his metformin regularly twice a day but does need to eat when he takes this to avoid nausea in the morning  Side effects from medications have been: none    Glucose monitoring:   Frequency of monitoring    less than once a day    Glucometer:  Contour .      Blood Glucose readings from monitor recently show fasting range of 96-129 and nonfasting 96  Dietician  visit, most recent: 01/2018               Exercise:  He is doing walking or other activities like yard work  Weight history:  Wt Readings from Last 3 Encounters:  12/26/22 233 lb 9.6 oz (106 kg)  12/11/22 233 lb 8 oz (105.9 kg)  11/13/22 234 lb (106.1 kg)    Glycemic control:   Lab Results  Component Value Date   HGBA1C 5.9 12/11/2022   HGBA1C 5.8 (H) 09/18/2022   HGBA1C 5.9 04/04/2022   Lab Results  Component Value Date   MICROALBUR 10.6 (H) 04/04/2022   LDLCALC Comment (A) 12/22/2020   CREATININE 1.28 12/11/2022   Lab Results  Component Value Date   MICRALBCREAT 15.7 04/04/2022    Lab Results  Component Value Date   FRUCTOSAMINE 211 12/19/2022    LIPID management: Discussed in review of systems   Allergies as of 12/26/2022   No Known Allergies      Medication List        Accurate as of December 26, 2022  8:56 AM. If you have any questions, ask your nurse or doctor.          STOP taking these medications    Vitamin D3 125 MCG (5000 UT) capsule Generic drug: Cholecalciferol Stopped by: Elayne Snare, MD  TAKE these medications    acetaminophen 500 MG tablet Commonly known as: TYLENOL Take 500 mg by mouth every 6 (six) hours as needed for mild pain, fever or headache.   allopurinol 300 MG tablet Commonly known as: ZYLOPRIM TAKE 1 TABLET BY MOUTH EVERY DAY   aspirin EC 81 MG tablet Take 81 mg by mouth daily. Swallow whole.   atorvastatin 40 MG tablet Commonly known as: LIPITOR TAKE 1 TABLET BY MOUTH EVERY DAY   blood glucose meter kit and supplies Please check your blood pressure twice a day and keep a log.   Contour Next Test test strip Generic drug: glucose blood USE AS INSTRUCTED TO TEST BLOOD SUGARS 2 TIMES DAILY. DX CODE   Contour Next Test test strip Generic drug: glucose blood TEST BLOOD SUGAR EVERY DAY   Farxiga 10 MG Tabs tablet Generic drug: dapagliflozin propanediol Take 10 mg by mouth daily.   fenofibrate  micronized 134 MG capsule Commonly known as: LOFIBRA TAKE 1 CAPSULE BY MOUTH EVERY DAY   Lancets Misc 1 each by Does not apply route 2 (two) times daily. To be used with Contour glucose meter. Dx: E11.8   lisinopril 20 MG tablet Commonly known as: ZESTRIL Take 20 mg by mouth daily.   metFORMIN 1000 MG tablet Commonly known as: GLUCOPHAGE TAKE 1 TABLET (1,000 MG TOTAL) BY MOUTH TWICE A DAY WITH FOOD   omeprazole 20 MG tablet Commonly known as: PRILOSEC OTC Take 20 mg by mouth daily.   QC TUMERIC COMPLEX PO Take 1 capsule by mouth as needed (1500 mg).   Vascepa 1 g capsule Generic drug: icosapent Ethyl TAKE 2 CAPSULES BY MOUTH TWICE A DAY   vitamin C 1000 MG tablet Take 1,000 mg by mouth daily.   VITAMIN K2 PO Take by mouth.   zolpidem 10 MG tablet Commonly known as: AMBIEN Take 1 tablet (10 mg total) by mouth at bedtime as needed for sleep.        Allergies: No Known Allergies  Past Medical History:  Diagnosis Date   Cancer ([place]) 02/2004   ;Hx: of right kidney cancer; kidney removed in 2005   Diabetes mellitus without complication ([place])    GERD (gastroesophageal reflux disease)    Gout    Hx: of   Hyperlipidemia    Hypertension     Past Surgical History:  Procedure Laterality Date   FRACTURE SURGERY N/A    [facility]reesia 12/21/2020   Fx: tibula     Plateau fx:   HERNIA REPAIR N/A    [facility]reesia 12/21/2020   INGUINAL HERNIA REPAIR     age 83   KIDNEY SURGERY  02/2004   Hx; of removal of right kidnety in 2005   ORIF TIBIA PLATEAU Left 08/25/2013   Procedure: OPEN REDUCTION INTERNAL FIXATION (ORIF) TIBIAL PLATEAU;  Surgeon: Renette Butters, MD;  Location: [facility];  Service: Orthopedics;  Laterality: Left;    Family History  Problem Relation Age of Onset   Heart disease Father    Hyperlipidemia Brother    Colon cancer Neg Hx    Esophageal cancer Neg Hx    Prostate cancer Neg Hx    Rectal cancer Neg Hx    Stomach cancer Neg Hx    Diabetes Neg Hx      Social History:  reports that he quit smoking about 18 years ago. His smoking use included cigarettes. He has never used smokeless tobacco. He reports current alcohol use of about 4.0 standard drinks of alcohol  per week. He reports that he does not use drugs.   Review of Systems   Lipid history:   He had been on fenofibrate along with Vascepa since 11/2018 Previously had baseline triglycerides over 800 LDL has been usually below 100 without a statin drug  His PCP sent him to the cardiologist who is now managing his lipids  He is taking 40 mg Lipitor Continues on Vascepa and fenofibrate   NMR panel recently showed particle number over 1000 hide lipoprotein a   Lab Results  Component Value Date   CHOL 127 06/27/2021   CHOL 193 04/25/2021   CHOL 206 (H) 12/22/2020   Lab Results  Component Value Date   HDL 25.20 (L) 06/27/2021   HDL 28.60 (L) 04/25/2021   HDL 21 (L) 12/22/2020   Lab Results  Component Value Date   LDLCALC Comment (A) 12/22/2020   [place] Comment 11/06/2018   [place] 104 (H) 08/01/2018   Lab Results  Component Value Date   TRIG 352.0 (H) 06/27/2021   TRIG (H) 04/25/2021    514.0 Triglyceride is over 400; calculations on Lipids are invalid.   TRIG 1,021 (HH) 12/22/2020   Lab Results  Component Value Date   CHOLHDL 5 06/27/2021   CHOLHDL 7 04/25/2021   CHOLHDL 9.8 (H) 12/22/2020   Lab Results  Component Value Date   LDLDIRECT 71.0 06/27/2021   LDLDIRECT 108.0 04/25/2021   LDLDIRECT 53.0 06/23/2020        Lab Results  Component Value Date   ALT 46 12/11/2022        Hypertension: On treatment since 2005  Still followed by nephrologist also has proteinuria and history of nephrectomy Microalbumin is normal  Blood pressure monitor at home shows recent reading of 131/88 but otherwise lower, lisinopril has been increased  Taking 20 mg lisinopril, also on Farxiga 10 mg since 3/22  BP Readings from Last 3 Encounters:  12/26/22 124/70   12/11/22 120/72  11/13/22 125/84   Has borderline renal function as before  Lab Results  Component Value Date   CREATININE 1.28 12/11/2022   CREATININE 1.19 09/18/2022   CREATININE 1.35 04/04/2022   Last urine microalbumin 5/23  No recent reports of eye exams available  Most recent foot exam: 2/24  Only has mild occasional tingling in his feet   LABS:  No visits with results within 1 Week(s) from this visit.  Latest known visit with results is:  Lab on 12/19/2022  Component Date Value Ref Range Status   Glucose, Bld 12/19/2022 111 (H)  70 - 99 mg/dL Final   Fructosamine 12/19/2022 211  0 - 285 umol/L Final   Comment: Published reference interval for apparently healthy subjects between age 59 and 28 is 21 - 285 umol/L and in a poorly controlled diabetic population is 228 - 563 umol/L with a mean of 396 umol/L.     Physical Examination:  BP 124/70 (BP Location: Left Arm, Patient Position: Sitting, Cuff Size: Normal)   Pulse 95   Ht 6' (1.829 m)   Wt 233 lb 9.6 oz (106 kg)   SpO2 95%   BMI 31.68 kg/m   Diabetic Foot Exam - Simple   Simple Foot Form Diabetic Foot exam was performed with the following findings: Yes   Visual Inspection No deformities, no ulcerations, no other skin breakdown bilaterally: Yes Sensation Testing Intact to touch and monofilament testing bilaterally: Yes Pulse Check Posterior Tibialis and Dorsalis pulse intact bilaterally: Yes Comments  ASSESSMENT:  HYPERTRIGLYCERIDEMIA: He has had persistent hypertriglyceridemia This is managed by cardiologist Message sent to cardiologist today about lipoprotein a treatment with niacin but cardiologist does not feel that this would be effective as high doses will be required he   Diabetes type 2 with obesity, on metformin and Farxiga  His A1c is again below 6%  PLAN:    No change in medication regimen Try to consistently check readings after meals Continue exercise  regimen Follow-up in 6 months  There are no Patient Instructions on file for this visit.      Elayne Snare 12/26/2022, 8:56 AM   Note: This office note was prepared with Dragon voice recognition system technology. Any transcriptional errors that result from this process are unintentional.

## 2023-01-09 ENCOUNTER — Other Ambulatory Visit: Payer: Self-pay | Admitting: Endocrinology

## 2023-02-04 DIAGNOSIS — I129 Hypertensive chronic kidney disease with stage 1 through stage 4 chronic kidney disease, or unspecified chronic kidney disease: Secondary | ICD-10-CM | POA: Diagnosis not present

## 2023-02-04 DIAGNOSIS — R809 Proteinuria, unspecified: Secondary | ICD-10-CM | POA: Diagnosis not present

## 2023-02-04 DIAGNOSIS — E1122 Type 2 diabetes mellitus with diabetic chronic kidney disease: Secondary | ICD-10-CM | POA: Diagnosis not present

## 2023-02-04 DIAGNOSIS — N1831 Chronic kidney disease, stage 3a: Secondary | ICD-10-CM | POA: Diagnosis not present

## 2023-02-08 ENCOUNTER — Other Ambulatory Visit: Payer: Self-pay | Admitting: Endocrinology

## 2023-03-04 ENCOUNTER — Telehealth: Payer: Self-pay | Admitting: Internal Medicine

## 2023-03-04 DIAGNOSIS — E1169 Type 2 diabetes mellitus with other specified complication: Secondary | ICD-10-CM

## 2023-03-04 NOTE — Telephone Encounter (Signed)
Pt wants to see if he needs any labs or tests done, prior to his appt with Dr. Rennis Golden on 5/2.

## 2023-03-05 NOTE — Telephone Encounter (Signed)
Left detailed message for patient to make aware lab order placed.

## 2023-03-06 DIAGNOSIS — E785 Hyperlipidemia, unspecified: Secondary | ICD-10-CM | POA: Diagnosis not present

## 2023-03-06 DIAGNOSIS — E1169 Type 2 diabetes mellitus with other specified complication: Secondary | ICD-10-CM | POA: Diagnosis not present

## 2023-03-07 ENCOUNTER — Encounter: Payer: Self-pay | Admitting: Internal Medicine

## 2023-03-07 ENCOUNTER — Ambulatory Visit: Payer: BC Managed Care – PPO | Attending: Internal Medicine | Admitting: Internal Medicine

## 2023-03-07 VITALS — BP 110/70 | HR 85 | Ht 72.0 in | Wt 231.8 lb

## 2023-03-07 DIAGNOSIS — R931 Abnormal findings on diagnostic imaging of heart and coronary circulation: Secondary | ICD-10-CM

## 2023-03-07 DIAGNOSIS — E785 Hyperlipidemia, unspecified: Secondary | ICD-10-CM

## 2023-03-07 DIAGNOSIS — E1169 Type 2 diabetes mellitus with other specified complication: Secondary | ICD-10-CM

## 2023-03-07 DIAGNOSIS — I10 Essential (primary) hypertension: Secondary | ICD-10-CM | POA: Diagnosis not present

## 2023-03-07 LAB — NMR, LIPOPROFILE
Cholesterol, Total: 123 mg/dL (ref 100–199)
HDL Particle Number: 30.5 umol/L (ref >=30.5)
HDL-C: 33 mg/dL — ABNORMAL LOW (ref >39)
LDL Particle Number: 1170 nmol/L — ABNORMAL HIGH (ref <1000)
LDL Size: 20.2 nm — ABNORMAL LOW (ref >20.5)
LDL-C (NIH Calc): 70 mg/dL (ref 0–99)
LP-IR Score: 69 — ABNORMAL HIGH (ref <=45)
Small LDL Particle Number: 718 nmol/L — ABNORMAL HIGH (ref <=527)
Triglycerides: 110 mg/dL (ref 0–149)

## 2023-03-07 NOTE — Patient Instructions (Signed)
Medication Instructions:  NO CHANGES  *If you need a refill on your cardiac medications before your next appointment, please call your pharmacy*   Lab Work: FASTING lab work to check cholesterol in 1 year -- one week before next appointment   If you have labs (blood work) drawn today and your tests are completely normal, you will receive your results only by: MyChart Message (if you have MyChart) OR A paper copy in the mail If you have any lab test that is abnormal or we need to change your treatment, we will call you to review the results.   Follow-Up: At [facility], you and your health needs are our priority.  As part of our continuing mission to provide you with exceptional heart care, we have created designated Provider Care Teams.  These Care Teams include your primary Cardiologist (physician) and Advanced Practice Providers (APPs -  Physician Assistants and Nurse Practitioners) who all work together to provide you with the care you need, when you need it.  We recommend signing up for the patient portal called "MyChart".  Sign up information is provided on this After Visit Summary.  MyChart is used to connect with patients for Virtual Visits (Telemedicine).  Patients are able to view lab/test results, encounter notes, upcoming appointments, etc.  Non-urgent messages can be sent to your provider as well.   To learn more about what you can do with MyChart, go to [email or web address].    Your next appointment:    12 months with Dr. Rennis Golden

## 2023-03-07 NOTE — Progress Notes (Signed)
LIPID CLINIC CONSULT NOTE  Chief Complaint:  Follow-up high triglycerides  Primary Care Physician: Georgina Quint, MD  Primary Cardiologist:  None  HPI:  Gordon Green is a 62 y.o. male who is being seen today for the evaluation of high triglyceride at the request of Georgina Quint, *.  This is a pleasant 62 year old male kindly referred by Dr. Alvy Bimler, for evaluation management of high triglycerides.  He recently established with his new primary care provider but has a longstanding history of high triglycerides he says dating back to at least his 33s.  He also has type 2 diabetes which appears well managed recent A1c of 5.8, hypertension and dyslipidemia.  He does report family history of heart disease including his father who had unfortunately died of an MI at age 61 and had high cholesterol.  He had previously been on fenofibrate and prior to that simvastatin Mediport apparently the simvastatin did not lower his cholesterol as well as expected.  He had also been prescribed Vascepa but was only taking 2 g twice daily.  He also made dietary changes and had increased saturated fats in his triglycerides more recently everywhere 2022 showed triglycerides of 1021.  He then had increased his Vascepa to 2 g twice daily, changed his diet and was more active and triglycerides have come down significantly to 514, however his LDL cholesterol is elevated at 108.  He is not on a statin.  He does have a history of kidney cancer and is a survivor of this.  He is never had a stroke or MI that he is aware of.  Has not had any prior cardiac testing.  He denies any symptoms such as chest pain or worsening shortness of breath.  05/31/2021  Gordon Green returns today for follow-up.  He underwent recent calcium scoring which surprisingly showed an abnormally high calcium score of 2833.  This demonstrated multivessel coronary calcium which somewhat spared the left circumflex otherwise a significant  finding.  He reports only about 3 years of diabetes therefore I think it is unlikely related to that.  Suspect there is family history of strong early onset heart disease in his father as well as dyslipidemia with high triglycerides are major risk factors.  We had a discussion today about possible stress testing however he said he did significant hike this past weekend for several hours in the [facility] and had no chest pain or significant shortness of breath.  He exercises regularly and says he is asymptomatic.  09/13/2022  Gordon Green returns today for follow-up.  He seems to be doing well.  Denies any coronary symptoms although had a high calcium score.  He has been more physically active and has lost about 10 pounds.  This will likely result in no lower cholesterol.  He is compliant with his medications and denies any side effects with this.  Of note he did have some mild elevation in the ALT in the past, probably from some fatty liver.  This will need to be monitored on combination therapy.  03/07/2023  Gordon Green returns today for follow-up.  His triglycerides are significantly improved at this point.  Probably the best that I seen him at 210 back in November, total cholesterol 120, HDL 22 and LDL 63.  Initially his blood pressure was elevated 6/74 however recheck came down to 110/70.  He does have a high LP(a) at 114.  His LDL particle number is now down to 929.  PMHx:  Past Medical  History:  Diagnosis Date   Cancer ([facility]) 02/2004   ;Hx: of right kidney cancer; kidney removed in 2005   Diabetes mellitus without complication ([facility])    GERD (gastroesophageal reflux disease)    Gout    Hx: of   Hyperlipidemia    Hypertension     Past Surgical History:  Procedure Laterality Date   FRACTURE SURGERY N/A    [facility]reesia 12/21/2020   Fx: tibula     Plateau fx:   HERNIA REPAIR N/A    [facility] 12/21/2020   INGUINAL HERNIA REPAIR     age 75   KIDNEY SURGERY  02/2004   Hx; of removal of  right kidnety in 2005   ORIF TIBIA PLATEAU Left 08/25/2013   Procedure: OPEN REDUCTION INTERNAL FIXATION (ORIF) TIBIAL PLATEAU;  Surgeon: Sheral Apley, MD;  Location: [facility];  Service: Orthopedics;  Laterality: Left;    FAMHx:  Family History  Problem Relation Age of Onset   Heart disease Father    Hyperlipidemia Brother    Colon cancer Neg Hx    Esophageal cancer Neg Hx    Prostate cancer Neg Hx    Rectal cancer Neg Hx    Stomach cancer Neg Hx    Diabetes Neg Hx     SOCHx:   reports that he quit smoking about 18 years ago. His smoking use included cigarettes. He has never used smokeless tobacco. He reports current alcohol use of about 4.0 standard drinks of alcohol per week. He reports that he does not use drugs.  ALLERGIES:  No Known Allergies  ROS: Pertinent items noted in HPI and remainder of comprehensive ROS otherwise negative.  HOME MEDS: Current Outpatient Medications on File Prior to Visit  Medication Sig Dispense Refill   acetaminophen (TYLENOL) 500 MG tablet Take 500 mg by mouth every 6 (six) hours as needed for mild pain, fever or headache.     allopurinol (ZYLOPRIM) 300 MG tablet TAKE 1 TABLET BY MOUTH EVERY DAY 90 tablet 2   Ascorbic Acid (VITAMIN C) 1000 MG tablet Take 1,000 mg by mouth daily.     aspirin EC 81 MG tablet Take 81 mg by mouth daily. Swallow whole.     atorvastatin (LIPITOR) 40 MG tablet TAKE 1 TABLET BY MOUTH EVERY DAY 90 tablet 3   blood glucose meter kit and supplies Please check your blood pressure twice a day and keep a log. 1 each 0   CONTOUR NEXT TEST test strip USE AS INSTRUCTED TO TEST BLOOD SUGARS 2 TIMES DAILY. DX CODE 100 each 3   FARXIGA 10 MG TABS tablet Take 10 mg by mouth daily.     fenofibrate micronized (LOFIBRA) 134 MG capsule TAKE 1 CAPSULE BY MOUTH EVERY DAY 90 capsule 1   glucose blood (CONTOUR NEXT TEST) test strip TEST BLOOD SUGAR EVERY DAY 100 strip 3   Lancets MISC 1 each by Does not apply route 2 (two) times daily. To  be used with Contour glucose meter. Dx: E11.8 200 each 0   lisinopril (ZESTRIL) 20 MG tablet Take 20 mg by mouth daily.     Menaquinone-7 (VITAMIN K2 PO) Take by mouth.     metFORMIN (GLUCOPHAGE) 1000 MG tablet TAKE 1 TABLET (1,000 MG TOTAL) BY MOUTH TWICE A DAY WITH FOOD 180 tablet 1   omeprazole (PRILOSEC OTC) 20 MG tablet Take 20 mg by mouth daily.     Turmeric (QC TUMERIC COMPLEX PO) Take 1 capsule by mouth as needed (1500 mg).  VASCEPA 1 g capsule TAKE 2 CAPSULES BY MOUTH TWICE A DAY 120 capsule 3   zolpidem (AMBIEN) 10 MG tablet Take 1 tablet (10 mg total) by mouth at bedtime as needed for sleep. 15 tablet 1   No current facility-administered medications on file prior to visit.    LABS/IMAGING: No results found for this or any previous visit (from the past 48 hour(s)). No results found.  LIPID PANEL:    Component Value Date/Time   CHOL 127 06/27/2021 1049   CHOL 206 (H) 12/22/2020 1352   TRIG 352.0 (H) 06/27/2021 1049   HDL 25.20 (L) 06/27/2021 1049   HDL 21 (L) 12/22/2020 1352   CHOLHDL 5 06/27/2021 1049   VLDL 70.4 (H) 06/27/2021 1049   LDLCALC Comment (A) 12/22/2020 1352   LDLDIRECT 71.0 06/27/2021 1049    WEIGHTS: Wt Readings from Last 3 Encounters:  03/07/23 231 lb 12.8 oz (105.1 kg)  12/26/22 233 lb 9.6 oz (106 kg)  12/11/22 233 lb 8 oz (105.9 kg)    VITALS: BP 110/70   Pulse 85   Ht 6' (1.829 m)   Wt 231 lb 12.8 oz (105.1 kg)   SpO2 96%   BMI 31.44 kg/m   EXAM: Deferred  EKG: Deferred  ASSESSMENT: Mixed dyslipidemia with high triglycerides, 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  Mr. Vieyra has good blood pressure control and his cholesterol is the best that I have seen.  He is continue to work on that.  Although he has a high calcium score has been asymptomatic.  Overall he seems to be doing well.   No changes to his meds today.  Plan follow-up with me in or sooner as necessary.  Chrystie Nose, MD, [facility], FACP  [place]  [facility]  Medical Director of the Advanced Lipid Disorders &  Cardiovascular Risk Reduction Clinic Diplomate of the American Board of Clinical Lipidology Attending Cardiologist  Direct Dial: [phone]  Fax: [phone]  Website:  [email or web address]  Lisette Abu Shanina Kepple 03/08/2023, 10:16 AM

## 2023-03-08 ENCOUNTER — Encounter: Payer: Self-pay | Admitting: Internal Medicine

## 2023-03-12 ENCOUNTER — Ambulatory Visit: Payer: BC Managed Care – PPO | Admitting: Internal Medicine

## 2023-03-12 VITALS — BP 140/90 | HR 96 | Temp 98.7°F | Ht 72.0 in

## 2023-03-12 DIAGNOSIS — M545 Low back pain, unspecified: Secondary | ICD-10-CM | POA: Diagnosis not present

## 2023-03-12 NOTE — Progress Notes (Signed)
   Subjective:   Patient ID: Gordon Green, male    DOB: 04/13/1961, 62 y.o.   MRN: [id number]  HPI The patient is a 63 YO man coming in for back pain.Started end of April.   Review of Systems  Constitutional:  Positive for activity change. Negative for appetite change, fatigue, fever and unexpected weight change.  Respiratory: Negative.    Cardiovascular: Negative.   Musculoskeletal:  Positive for back pain and myalgias. Negative for arthralgias.  Skin: Negative.   Neurological:  Negative for syncope, weakness and numbness.    Objective:  Physical Exam Constitutional:      Appearance: He is well-developed.  HENT:     Head: Normocephalic and atraumatic.  Cardiovascular:     Rate and Rhythm: Normal rate and regular rhythm.  Pulmonary:     Effort: Pulmonary effort is normal. No respiratory distress.     Breath sounds: Normal breath sounds. No wheezing or rales.  Abdominal:     General: Bowel sounds are normal. There is no distension.     Palpations: Abdomen is soft.     Tenderness: There is no abdominal tenderness. There is no rebound.  Musculoskeletal:        General: Tenderness present.     Cervical back: Normal range of motion.  Skin:    General: Skin is warm and dry.  Neurological:     Mental Status: He is alert and oriented to person, place, and time.     Coordination: Coordination normal.     Vitals:   03/12/23 1523 03/12/23 1525  BP: (!) 140/90 (!) 140/90  Pulse: 96   Temp: 98.7 F (37.1 C)   TempSrc: Oral   SpO2: 95%   Height: 6' (1.829 m)     Assessment & Plan:  Visit time 15 minutes in face to face communication with patient and coordination of care, additional 5 minutes spent in record review, coordination or care, ordering tests, communicating/referring to other healthcare professionals, documenting in medical records all on the same day of the visit for total time 20 minutes spent on the visit.

## 2023-03-15 DIAGNOSIS — M545 Low back pain, unspecified: Secondary | ICD-10-CM | POA: Insufficient documentation

## 2023-03-15 NOTE — Assessment & Plan Note (Signed)
Overall this is improving. He is not in need of imaging today. Using otc for pain. Due to single kidney he is unable to use nsaids. Okay to use tylenol for pain. OTC lidocaine patches and otc voltaren gel okay to use topically for pain as well. Given exercise to help with avoiding flare in future.

## 2023-04-07 ENCOUNTER — Other Ambulatory Visit (HOSPITAL_BASED_OUTPATIENT_CLINIC_OR_DEPARTMENT_OTHER): Payer: Self-pay | Admitting: Internal Medicine

## 2023-05-15 DIAGNOSIS — N1831 Chronic kidney disease, stage 3a: Secondary | ICD-10-CM | POA: Diagnosis not present

## 2023-06-15 ENCOUNTER — Other Ambulatory Visit: Payer: Self-pay | Admitting: Endocrinology

## 2023-06-18 ENCOUNTER — Other Ambulatory Visit: Payer: Self-pay | Admitting: Emergency Medicine

## 2023-06-18 ENCOUNTER — Other Ambulatory Visit: Payer: Self-pay | Admitting: Endocrinology

## 2023-06-18 DIAGNOSIS — E118 Type 2 diabetes mellitus with unspecified complications: Secondary | ICD-10-CM

## 2023-06-24 ENCOUNTER — Other Ambulatory Visit (INDEPENDENT_AMBULATORY_CARE_PROVIDER_SITE_OTHER): Payer: BC Managed Care – PPO

## 2023-06-24 DIAGNOSIS — E669 Obesity, unspecified: Secondary | ICD-10-CM | POA: Diagnosis not present

## 2023-06-24 DIAGNOSIS — E1169 Type 2 diabetes mellitus with other specified complication: Secondary | ICD-10-CM | POA: Diagnosis not present

## 2023-06-25 LAB — BASIC METABOLIC PANEL
BUN: 29 mg/dL — ABNORMAL HIGH (ref 6–23)
CO2: 28 meq/L (ref 19–32)
Calcium: 9.8 mg/dL (ref 8.4–10.5)
Chloride: 101 meq/L (ref 96–112)
Creatinine, Ser: 1.33 mg/dL (ref 0.40–1.50)
GFR: 57.76 mL/min — ABNORMAL LOW (ref >60.00)
Glucose, Bld: 92 mg/dL (ref 70–99)
Potassium: 4.7 meq/L (ref 3.5–5.1)
Sodium: 139 meq/L (ref 135–145)

## 2023-06-25 LAB — MICROALBUMIN / CREATININE URINE RATIO
Creatinine,U: 81.3 mg/dL
Microalb Creat Ratio: 17.7 mg/g (ref 0.0–30.0)
Microalb, Ur: 14.4 mg/dL — ABNORMAL HIGH (ref 0.0–1.9)

## 2023-06-25 LAB — HEMOGLOBIN A1C: Hgb A1c MFr Bld: 5.8 % (ref 4.6–6.5)

## 2023-06-26 ENCOUNTER — Ambulatory Visit: Payer: BC Managed Care – PPO | Admitting: Endocrinology

## 2023-06-26 NOTE — Progress Notes (Deleted)
Patient ID: Gordon Green, male   DOB: Dec 10, 1960, 61 y.o.   MRN: [id number]          Reason for Appointment: Follow-up for various problems   History of Present Illness:          Date of diagnosis of type 2 diabetes mellitus: 11/2017        Background history:       He had presented to his urgent care center with symptoms of excessive thirst, urination, fatigue and blurred vision starting in late December following a significant respiratory infection His blood sugar was markedly increased and he was referred for admission, however was treated in the emergency room for his blood sugar of 409 with IV fluids and an injection of insulin.  He did not have any renal function abnormalities or acidosis He was sent home on metformin 1 g twice a day  His baseline A1c at diagnosis was 11.6   Recent history:  Non-insulin hypoglycemic drugs the patient is taking are: Metformin 1000 at breakfast and 1 g dinner, Farxiga 10 mg daily  A1c is about the same at 5.9   Current management, blood sugar patterns and problems identified: Blood sugar monitoring has been infrequent with only 5 readings in the last month He is generally not checking readings after dinner with only 1 reading around 7 PM of 96 However morning readings are recently fairly good and lab glucose was 111 He does feel that he is done generally fairly well with diet although his getting some carbohydrates with his meals now Overall has been able to continue his walking and generally very active Since about 11/23 he appears to have lost about 8 pounds He does try to take his metformin regularly twice a day but does need to eat when he takes this to avoid nausea in the morning  Side effects from medications have been: none    Glucose monitoring:   Frequency of monitoring    less than once a day    Glucometer:  Contour .      Blood Glucose readings from monitor recently show fasting range of 96-129 and nonfasting 96  Dietician  visit, most recent: 01/2018               Exercise:  He is doing walking or other activities like yard work  Weight history:  Wt Readings from Last 3 Encounters:  03/07/23 231 lb 12.8 oz (105.1 kg)  12/26/22 233 lb 9.6 oz (106 kg)  12/11/22 233 lb 8 oz (105.9 kg)    Glycemic control:   Lab Results  Component Value Date   HGBA1C 5.8 06/24/2023   HGBA1C 5.9 12/11/2022   HGBA1C 5.8 (H) 09/18/2022   Lab Results  Component Value Date   MICROALBUR 14.4 (H) 06/24/2023   LDLCALC Comment (A) 12/22/2020   CREATININE 1.33 06/24/2023   Lab Results  Component Value Date   MICRALBCREAT 17.7 06/24/2023    Lab Results  Component Value Date   FRUCTOSAMINE 211 12/19/2022    LIPID management: Discussed in review of systems   Allergies as of 06/26/2023   No Known Allergies      Medication List        Accurate as of June 26, 2023  8:14 AM. If you have any questions, ask your nurse or doctor.          acetaminophen 500 MG tablet Commonly known as: TYLENOL Take 500 mg by mouth every 6 (six) hours as needed for  mild pain, fever or headache.   allopurinol 300 MG tablet Commonly known as: ZYLOPRIM TAKE 1 TABLET BY MOUTH EVERY DAY   aspirin EC 81 MG tablet Take 81 mg by mouth daily. Swallow whole.   atorvastatin 40 MG tablet Commonly known as: LIPITOR TAKE 1 TABLET BY MOUTH EVERY DAY   blood glucose meter kit and supplies Please check your blood pressure twice a day and keep a log.   Contour Next Test test strip Generic drug: glucose blood USE AS INSTRUCTED TO TEST BLOOD SUGARS 2 TIMES DAILY. DX CODE   Contour Next Test test strip Generic drug: glucose blood TEST BLOOD SUGAR EVERY DAY   Farxiga 10 MG Tabs tablet Generic drug: dapagliflozin propanediol Take 10 mg by mouth daily.   fenofibrate micronized 134 MG capsule Commonly known as: LOFIBRA TAKE 1 CAPSULE BY MOUTH EVERY DAY   Lancets Misc 1 each by Does not apply route 2 (two) times daily. To be used  with Contour glucose meter. Dx: E11.8   lisinopril 20 MG tablet Commonly known as: ZESTRIL Take 20 mg by mouth daily.   metFORMIN 1000 MG tablet Commonly known as: GLUCOPHAGE TAKE 1 TABLET (1,000 MG TOTAL) BY MOUTH TWICE A DAY WITH FOOD   omeprazole 20 MG tablet Commonly known as: PRILOSEC OTC Take 20 mg by mouth daily.   QC TUMERIC COMPLEX PO Take 1 capsule by mouth as needed (1500 mg).   Vascepa 1 g capsule Generic drug: icosapent Ethyl TAKE 2 CAPSULES BY MOUTH TWICE A DAY   vitamin C 1000 MG tablet Take 1,000 mg by mouth daily.   VITAMIN K2 PO Take by mouth.   zolpidem 10 MG tablet Commonly known as: AMBIEN Take 1 tablet (10 mg total) by mouth at bedtime as needed for sleep.        Allergies: No Known Allergies  Past Medical History:  Diagnosis Date  . Cancer (HCC) 02/2004   ;Hx: of right kidney cancer; kidney removed in 2005  . Diabetes mellitus without complication (HCC)   . GERD (gastroesophageal reflux disease)   . Gout    Hx: of  . Hyperlipidemia   . Hypertension     Past Surgical History:  Procedure Laterality Date  . FRACTURE SURGERY N/A    [facility] 12/21/2020  . Fx: tibula     Plateau fx:  Marland Kitchen HERNIA REPAIR N/A    [facility] 12/21/2020  . INGUINAL HERNIA REPAIR     age 75  . KIDNEY SURGERY  02/2004   Hx; of removal of right kidnety in 2005  . ORIF TIBIA PLATEAU Left 08/25/2013   Procedure: OPEN REDUCTION INTERNAL FIXATION (ORIF) TIBIAL PLATEAU;  Surgeon: Sheral Apley, MD;  Location: [facility] OR;  Service: Orthopedics;  Laterality: Left;    Family History  Problem Relation Age of Onset  . Heart disease Father   . Hyperlipidemia Brother   . Colon cancer Neg Hx   . Esophageal cancer Neg Hx   . Prostate cancer Neg Hx   . Rectal cancer Neg Hx   . Stomach cancer Neg Hx   . Diabetes Neg Hx     Social History:  reports that he quit smoking about 19 years ago. His smoking use included cigarettes. He has never used smokeless tobacco. He reports  current alcohol use of about 4.0 standard drinks of alcohol per week. He reports that he does not use drugs.   Review of Systems   Lipid history:   He had been on fenofibrate  along with Vascepa since 11/2018 Previously had baseline triglycerides over 800 LDL has been usually below 100 without a statin drug  His PCP sent him to the cardiologist who is now managing his lipids  He is taking 40 mg Lipitor Continues on Vascepa and fenofibrate   NMR panel recently showed particle number over 1000 hide lipoprotein a   Lab Results  Component Value Date   CHOL 127 06/27/2021   CHOL 193 04/25/2021   CHOL 206 (H) 12/22/2020   Lab Results  Component Value Date   HDL 25.20 (L) 06/27/2021   HDL 28.60 (L) 04/25/2021   HDL 21 (L) 12/22/2020   Lab Results  Component Value Date   LDLCALC Comment (A) 12/22/2020   LDLCALC Comment 11/06/2018   LDLCALC 104 (H) 08/01/2018   Lab Results  Component Value Date   TRIG 352.0 (H) 06/27/2021   TRIG (H) 04/25/2021    514.0 Triglyceride is over 400; calculations on Lipids are invalid.   TRIG 1,021 (HH) 12/22/2020   Lab Results  Component Value Date   CHOLHDL 5 06/27/2021   CHOLHDL 7 04/25/2021   CHOLHDL 9.8 (H) 12/22/2020   Lab Results  Component Value Date   LDLDIRECT 71.0 06/27/2021   LDLDIRECT 108.0 04/25/2021   LDLDIRECT 53.0 06/23/2020        Lab Results  Component Value Date   ALT 46 12/11/2022        Hypertension: On treatment since 2005  Still followed by nephrologist also has proteinuria and history of nephrectomy Microalbumin is normal  Blood pressure monitor at home shows recent reading of 131/88 but otherwise lower, lisinopril has been increased  Taking 20 mg lisinopril, also on Farxiga 10 mg since 3/22  BP Readings from Last 3 Encounters:  03/12/23 (!) 140/90  03/08/23 110/70  12/26/22 124/70   Has borderline renal function as before  Lab Results  Component Value Date   CREATININE 1.33 06/24/2023    CREATININE 1.28 12/11/2022   CREATININE 1.19 09/18/2022   Last urine microalbumin 5/23  No recent reports of eye exams available  Most recent foot exam: 2/24  Only has mild occasional tingling in his feet   LABS:  Lab on 06/24/2023  Component Date Value Ref Range Status  . Microalb, Ur 06/24/2023 14.4 (H)  0.0 - 1.9 mg/dL Final  . Creatinine,U 62/13/0865 81.3  mg/dL Final  . Microalb Creat Ratio 06/24/2023 17.7  0.0 - 30.0 mg/g Final  . Sodium 06/24/2023 139  135 - 145 mEq/L Final  . Potassium 06/24/2023 4.7  3.5 - 5.1 mEq/L Final  . Chloride 06/24/2023 101  96 - 112 mEq/L Final  . CO2 06/24/2023 28  19 - 32 mEq/L Final  . Glucose, Bld 06/24/2023 92  70 - 99 mg/dL Final  . BUN 78/46/9629 29 (H)  6 - 23 mg/dL Final  . Creatinine, Ser 06/24/2023 1.33  0.40 - 1.50 mg/dL Final  . GFR 52/84/1324 57.76 (L)  >60.00 mL/min Final   Calculated using the CKD-EPI Creatinine Equation (2021)  . Calcium 06/24/2023 9.8  8.4 - 10.5 mg/dL Final  . Hgb M0N MFr Bld 06/24/2023 5.8  4.6 - 6.5 % Final   Glycemic Control Guidelines for People with Diabetes:Non Diabetic:  <6%Goal of Therapy: <7%Additional Action Suggested:  >8%     Physical Examination:  There were no vitals taken for this visit.  Diabetic Foot Exam - Simple   No data filed        ASSESSMENT:  HYPERTRIGLYCERIDEMIA: He  has had persistent hypertriglyceridemia This is managed by cardiologist Message sent to cardiologist today about lipoprotein a treatment with niacin but cardiologist does not feel that this would be effective as high doses will be required he   Diabetes type 2 with obesity, on metformin and Farxiga  His A1c is again below 6%  PLAN:    No change in medication regimen Try to consistently check readings after meals Continue exercise regimen Follow-up in 6 months  There are no Patient Instructions on file for this visit.      Reather Littler 06/26/2023, 8:14 AM   Note: This office note was prepared  with Dragon voice recognition system technology. Any transcriptional errors that result from this process are unintentional.

## 2023-06-27 ENCOUNTER — Encounter: Payer: Self-pay | Admitting: Endocrinology

## 2023-06-27 ENCOUNTER — Ambulatory Visit: Payer: BC Managed Care – PPO | Admitting: Endocrinology

## 2023-06-27 VITALS — BP 130/76 | HR 79 | Ht 72.0 in | Wt 228.8 lb

## 2023-06-27 DIAGNOSIS — N289 Disorder of kidney and ureter, unspecified: Secondary | ICD-10-CM | POA: Diagnosis not present

## 2023-06-27 DIAGNOSIS — I10 Essential (primary) hypertension: Secondary | ICD-10-CM

## 2023-06-27 DIAGNOSIS — Z789 Other specified health status: Secondary | ICD-10-CM | POA: Diagnosis not present

## 2023-06-27 DIAGNOSIS — E1169 Type 2 diabetes mellitus with other specified complication: Secondary | ICD-10-CM | POA: Diagnosis not present

## 2023-06-27 DIAGNOSIS — Z7984 Long term (current) use of oral hypoglycemic drugs: Secondary | ICD-10-CM

## 2023-06-27 DIAGNOSIS — E669 Obesity, unspecified: Secondary | ICD-10-CM

## 2023-06-27 NOTE — Progress Notes (Signed)
Patient ID: Gordon Green, male   DOB: [birth year], 62 y.o.   MRN: [id number]          Reason for Appointment: Follow-up for various problems   History of Present Illness:          Date of diagnosis of type 2 diabetes mellitus: 11/2017        Background history:       He had presented to his urgent care center with symptoms of excessive thirst, urination, fatigue and blurred vision starting in late December following a significant respiratory infection His blood sugar was markedly increased and he was referred for admission, however was treated in the emergency room for his blood sugar of 409 with IV fluids and an injection of insulin.  He did not have any renal function abnormalities or acidosis He was sent home on metformin 1 g twice a day  His baseline A1c at diagnosis was 11.6   Recent history:   Non-insulin hypoglycemic drugs the patient is taking are: Metformin 1000 at breakfast and 1 g dinner, Farxiga 10 mg daily  A1c is about the same at 5.8   Current management, blood sugar patterns and problems identified: He is doing well with generally watching his diet well and planning healthy meals He says he is doing a lot more physical work and getting plenty of exercise His weight is down another 5 pounds since his last visit in February Currently taking metformin twice a day with food and has no side effect Has continued Farxiga unchanged Blood sugar monitoring is infrequent and mostly in the morning  Side effects from medications have been: none    Glucose monitoring:   Frequency of monitoring    less than once a day    Glucometer:  Contour .      Blood Glucose readings from monitor review Fasting 122, 130, after breakfast 156 Lunch time 89 No readings after dinner   Dietician visit, most recent: 01/2018  Weight history:  Wt Readings from Last 3 Encounters:  06/27/23 228 lb 12.8 oz (103.8 kg)  03/07/23 231 lb 12.8 oz (105.1 kg)  12/26/22 233 lb 9.6 oz (106 kg)     Glycemic control:   Lab Results  Component Value Date   HGBA1C 5.8 06/24/2023   HGBA1C 5.9 12/11/2022   HGBA1C 5.8 (H) 09/18/2022   Lab Results  Component Value Date   MICROALBUR 14.4 (H) 06/24/2023   LDLCALC Comment (A) 12/22/2020   CREATININE 1.33 06/24/2023   Lab Results  Component Value Date   MICRALBCREAT 17.7 06/24/2023    Lab Results  Component Value Date   FRUCTOSAMINE 211 12/19/2022    LIPID management: Discussed in review of systems   Allergies as of 06/27/2023   No Known Allergies      Medication List        Accurate as of June 27, 2023  1:42 PM. If you have any questions, ask your nurse or doctor.          acetaminophen 500 MG tablet Commonly known as: TYLENOL Take 500 mg by mouth every 6 (six) hours as needed for mild pain, fever or headache.   allopurinol 300 MG tablet Commonly known as: ZYLOPRIM TAKE 1 TABLET BY MOUTH EVERY DAY   aspirin EC 81 MG tablet Take 81 mg by mouth daily. Swallow whole.   atorvastatin 40 MG tablet Commonly known as: LIPITOR TAKE 1 TABLET BY MOUTH EVERY DAY   blood glucose meter kit and supplies Please  check your blood pressure twice a day and keep a log.   Contour Next Test test strip Generic drug: glucose blood USE AS INSTRUCTED TO TEST BLOOD SUGARS 2 TIMES DAILY. DX CODE   Contour Next Test test strip Generic drug: glucose blood TEST BLOOD SUGAR EVERY DAY   Farxiga 10 MG Tabs tablet Generic drug: dapagliflozin propanediol Take 10 mg by mouth daily.   fenofibrate micronized 134 MG capsule Commonly known as: LOFIBRA TAKE 1 CAPSULE BY MOUTH EVERY DAY   Lancets Misc 1 each by Does not apply route 2 (two) times daily. To be used with Contour glucose meter. Dx: E11.8   lisinopril 20 MG tablet Commonly known as: ZESTRIL Take 20 mg by mouth daily.   metFORMIN 1000 MG tablet Commonly known as: GLUCOPHAGE TAKE 1 TABLET (1,000 MG TOTAL) BY MOUTH TWICE A DAY WITH FOOD   omeprazole 20 MG  tablet Commonly known as: PRILOSEC OTC Take 20 mg by mouth daily.   QC TUMERIC COMPLEX PO Take 1 capsule by mouth as needed (1500 mg).   Vascepa 1 g capsule Generic drug: icosapent Ethyl TAKE 2 CAPSULES BY MOUTH TWICE A DAY   vitamin C 1000 MG tablet Take 1,000 mg by mouth daily.   VITAMIN K2 PO Take by mouth.   zolpidem 10 MG tablet Commonly known as: AMBIEN Take 1 tablet (10 mg total) by mouth at bedtime as needed for sleep.        Allergies: No Known Allergies  Past Medical History:  Diagnosis Date   Cancer (HCC) 02/2004   ;Hx: of right kidney cancer; kidney removed in 2005   Diabetes mellitus without complication (HCC)    GERD (gastroesophageal reflux disease)    Gout    Hx: of   Hyperlipidemia    Hypertension     Past Surgical History:  Procedure Laterality Date   FRACTURE SURGERY N/A    [facility] 12/21/2020   Fx: tibula     Plateau fx:   HERNIA REPAIR N/A    [facility] 12/21/2020   INGUINAL HERNIA REPAIR     age 33   KIDNEY SURGERY  02/2004   Hx; of removal of right kidnety in 2005   ORIF TIBIA PLATEAU Left 08/25/2013   Procedure: OPEN REDUCTION INTERNAL FIXATION (ORIF) TIBIAL PLATEAU;  Surgeon: Sheral Apley, MD;  Location: [facility] OR;  Service: Orthopedics;  Laterality: Left;    Family History  Problem Relation Age of Onset   Heart disease Father    Hyperlipidemia Brother    Colon cancer Neg Hx    Esophageal cancer Neg Hx    Prostate cancer Neg Hx    Rectal cancer Neg Hx    Stomach cancer Neg Hx    Diabetes Neg Hx     Social History:  reports that he quit smoking about 19 years ago. His smoking use included cigarettes. He has never used smokeless tobacco. He reports current alcohol use of about 4.0 standard drinks of alcohol per week. He reports that he does not use drugs.   Review of Systems   Lipid history:   He had been on fenofibrate along with Vascepa since 11/2018 Previously had baseline triglycerides over 800 LDL previously had been  usually below 100 without a statin drug  His PCP sent him to the cardiologist who is now managing his lipids  He is taking 40 mg Lipitor from the cardiologist Continues on Vascepa and fenofibrate   NMR panel recently showed particle number over 1000 However triglycerides  on lipo profile are back to normal at 110, LDL 70   Lab Results  Component Value Date   CHOL 127 06/27/2021   CHOL 193 04/25/2021   CHOL 206 (H) 12/22/2020   Lab Results  Component Value Date   HDL 25.20 (L) 06/27/2021   HDL 28.60 (L) 04/25/2021   HDL 21 (L) 12/22/2020   Lab Results  Component Value Date   LDLCALC Comment (A) 12/22/2020   LDLCALC Comment 11/06/2018   LDLCALC 104 (H) 08/01/2018   Lab Results  Component Value Date   TRIG 352.0 (H) 06/27/2021   TRIG (H) 04/25/2021    514.0 Triglyceride is over 400; calculations on Lipids are invalid.   TRIG 1,021 (HH) 12/22/2020   Lab Results  Component Value Date   CHOLHDL 5 06/27/2021   CHOLHDL 7 04/25/2021   CHOLHDL 9.8 (H) 12/22/2020   Lab Results  Component Value Date   LDLDIRECT 71.0 06/27/2021   LDLDIRECT 108.0 04/25/2021   LDLDIRECT 53.0 06/23/2020        Lab Results  Component Value Date   ALT 46 12/11/2022        Hypertension: On treatment since 2005  Still followed by nephrologist also has proteinuria and history of nephrectomy Microalbumin is normal  Blood pressure readings at home are excellent and generally around 120 systolic with some variability Blood pressure was high on first measurement today  Taking 20 mg lisinopril, also on Farxiga 10 mg since 3/22  BP Readings from Last 3 Encounters:  06/27/23 130/76  03/12/23 (!) 140/90  03/08/23 110/70   Has variable renal function, followed by nephrologist  Lab Results  Component Value Date   CREATININE 1.33 06/24/2023   CREATININE 1.28 12/11/2022   CREATININE 1.19 09/18/2022   Last urine microalbumin 5/23  No recent reports of eye exams available  Most recent  foot exam: 2/24 No significant neuropathy   LABS:  Lab on 06/24/2023  Component Date Value Ref Range Status   Microalb, Ur 06/24/2023 14.4 (H)  0.0 - 1.9 mg/dL Final   Creatinine,U 16/08/9603 81.3  mg/dL Final   Microalb Creat Ratio 06/24/2023 17.7  0.0 - 30.0 mg/g Final   Sodium 06/24/2023 139  135 - 145 mEq/L Final   Potassium 06/24/2023 4.7  3.5 - 5.1 mEq/L Final   Chloride 06/24/2023 101  96 - 112 mEq/L Final   CO2 06/24/2023 28  19 - 32 mEq/L Final   Glucose, Bld 06/24/2023 92  70 - 99 mg/dL Final   BUN 54/07/8118 29 (H)  6 - 23 mg/dL Final   Creatinine, Ser 06/24/2023 1.33  0.40 - 1.50 mg/dL Final   GFR 14/78/2956 57.76 (L)  >60.00 mL/min Final   Calculated using the CKD-EPI Creatinine Equation (2021)   Calcium 06/24/2023 9.8  8.4 - 10.5 mg/dL Final   Hgb O1H MFr Bld 06/24/2023 5.8  4.6 - 6.5 % Final   Glycemic Control Guidelines for People with Diabetes:Non Diabetic:  <6%Goal of Therapy: <7%Additional Action Suggested:  >8%     Physical Examination:  BP 130/76   Pulse 79   Ht 6' (1.829 m)   Wt 228 lb 12.8 oz (103.8 kg)   SpO2 96%   BMI 31.03 kg/m       ASSESSMENT:  HYPERTRIGLYCERIDEMIA: He has had persistent hypertriglyceridemia This is managed by cardiologist Most recent triglycerides are normal, likely from progressive weight loss and improve diet However still has high LDL particle number, currently on 40 mg of atorvastatin  Diabetes type  2 with obesity, on metformin and Farxiga  His A1c is consistently below 6% As mildly increased fasting blood sugars by history with not checking enough and no readings after meals Physically he is doing a lot of exercise and diet is excellent  HYPERTENSION: Well-controlled, may have some increase in blood pressure in the office on first measurement  Urine microalbumin now normal, may be benefiting from [place]  PLAN:    No change in treatment plan Try to start check readings about 2 hours after meals  Check blood  pressure regularly at home   Follow-up in 6 months  Patient Instructions  Check blood sugars on waking up 2-3 days a week  Also check blood sugars about 2 hours after meals and do this after different meals by rotation  Recommended blood sugar levels on waking up are 90-130 and about 2 hours after meal is 130-160  Please bring your blood sugar monitor to each visit, thank you       Reather Littler 06/27/2023, 1:42 PM   Note: This office note was prepared with Dragon voice recognition system technology. Any transcriptional errors that result from this process are unintentional.

## 2023-06-27 NOTE — Patient Instructions (Signed)
Check blood sugars on waking up 2-3 days a week  Also check blood sugars about 2 hours after meals and do this after different meals by rotation  Recommended blood sugar levels on waking up are 90-130 and about 2 hours after meal is 130-160  Please bring your blood sugar monitor to each visit, thank you   

## 2023-07-07 ENCOUNTER — Other Ambulatory Visit: Payer: Self-pay | Admitting: Endocrinology

## 2023-08-12 DIAGNOSIS — N1831 Chronic kidney disease, stage 3a: Secondary | ICD-10-CM | POA: Diagnosis not present

## 2023-08-13 ENCOUNTER — Telehealth: Payer: Self-pay | Admitting: Pharmacy Technician

## 2023-08-13 ENCOUNTER — Other Ambulatory Visit (HOSPITAL_COMMUNITY): Payer: Self-pay

## 2023-08-13 NOTE — Telephone Encounter (Signed)
Pharmacy Patient Advocate Encounter   Received notification from CoverMyMeds that prior authorization for Vascepa is due for renewal.   Insurance verification completed.   The patient is insured through [facility] .   Per test claim: The current 30 day co-pay is, $35.  No PA needed at this time. This test claim was processed through [facility]- copay amounts may vary at other pharmacies due to pharmacy/plan contracts, or as the patient moves through the different stages of their insurance plan.   Pt's ins prefers brand. Called pharmacy to advise them to use DAW 9, but they said the pt had picked up a fill in September and wasn't due until later this month.

## 2023-08-21 DIAGNOSIS — I129 Hypertensive chronic kidney disease with stage 1 through stage 4 chronic kidney disease, or unspecified chronic kidney disease: Secondary | ICD-10-CM | POA: Diagnosis not present

## 2023-08-21 DIAGNOSIS — N1831 Chronic kidney disease, stage 3a: Secondary | ICD-10-CM | POA: Diagnosis not present

## 2023-08-21 DIAGNOSIS — E1122 Type 2 diabetes mellitus with diabetic chronic kidney disease: Secondary | ICD-10-CM | POA: Diagnosis not present

## 2023-08-21 DIAGNOSIS — R809 Proteinuria, unspecified: Secondary | ICD-10-CM | POA: Diagnosis not present

## 2023-10-24 ENCOUNTER — Other Ambulatory Visit: Payer: Self-pay

## 2023-10-24 ENCOUNTER — Telehealth: Payer: Self-pay | Admitting: Endocrinology

## 2023-10-24 DIAGNOSIS — E1169 Type 2 diabetes mellitus with other specified complication: Secondary | ICD-10-CM

## 2023-10-24 MED ORDER — ICOSAPENT ETHYL 1 G PO CAPS
2.0000 g | ORAL_CAPSULE | Freq: Two times a day (BID) | ORAL | 3 refills | Status: DC
Start: 2023-10-24 — End: 2024-02-24

## 2023-10-24 NOTE — Telephone Encounter (Signed)
MEDICATION:  Vascepa VASCEPA 1 g capsule  PHARMACY:  [id number] - [place], [place] - [address] (Ph: [phone])   HAS THE PATIENT CONTACTED THEIR PHARMACY?  Yes  IS THIS A 90 DAY SUPPLY : Yes  IS PATIENT OUT OF MEDICATION: No  IF NOT; HOW MUCH IS LEFT: 1 Week  LAST APPOINTMENT DATE: @8 /22/2024  NEXT APPOINTMENT DATE:@2 /252025  DO WE HAVE YOUR PERMISSION TO LEAVE A DETAILED MESSAGE?: Yes  OTHER COMMENTS:    **Let patient know to contact pharmacy at the end of the day to make sure medication is ready. **  ** Please notify patient to allow 48-72 hours to process**  **Encourage patient to contact the pharmacy for refills or they can request refills through [facility]**

## 2023-11-20 DIAGNOSIS — N1831 Chronic kidney disease, stage 3a: Secondary | ICD-10-CM | POA: Diagnosis not present

## 2023-12-18 ENCOUNTER — Other Ambulatory Visit: Payer: Self-pay

## 2023-12-18 DIAGNOSIS — E1169 Type 2 diabetes mellitus with other specified complication: Secondary | ICD-10-CM

## 2023-12-24 ENCOUNTER — Other Ambulatory Visit: Payer: BC Managed Care – PPO

## 2023-12-24 ENCOUNTER — Encounter: Payer: Self-pay | Admitting: Endocrinology

## 2023-12-24 DIAGNOSIS — E1169 Type 2 diabetes mellitus with other specified complication: Secondary | ICD-10-CM | POA: Diagnosis not present

## 2023-12-25 LAB — HEMOGLOBIN A1C
Hgb A1c MFr Bld: 6 %{Hb} — ABNORMAL HIGH (ref <5.7)
Mean Plasma Glucose: 126 mg/dL
eAG (mmol/L): 7 mmol/L

## 2023-12-25 LAB — BASIC METABOLIC PANEL
BUN/Creatinine Ratio: 24 (calc) — ABNORMAL HIGH (ref 6–22)
BUN: 30 mg/dL — ABNORMAL HIGH (ref 7–25)
CO2: 30 mmol/L (ref 20–32)
Calcium: 10.3 mg/dL (ref 8.6–10.3)
Chloride: 100 mmol/L (ref 98–110)
Creat: 1.27 mg/dL (ref 0.70–1.35)
Glucose, Bld: 106 mg/dL — ABNORMAL HIGH (ref 65–99)
Potassium: 4.7 mmol/L (ref 3.5–5.3)
Sodium: 139 mmol/L (ref 135–146)

## 2023-12-31 ENCOUNTER — Ambulatory Visit: Payer: BC Managed Care – PPO | Admitting: Endocrinology

## 2023-12-31 ENCOUNTER — Encounter: Payer: Self-pay | Admitting: Endocrinology

## 2023-12-31 DIAGNOSIS — E118 Type 2 diabetes mellitus with unspecified complications: Secondary | ICD-10-CM

## 2023-12-31 DIAGNOSIS — Z7984 Long term (current) use of oral hypoglycemic drugs: Secondary | ICD-10-CM | POA: Diagnosis not present

## 2023-12-31 MED ORDER — METFORMIN HCL 1000 MG PO TABS
1000.0000 mg | ORAL_TABLET | Freq: Two times a day (BID) | ORAL | 3 refills | Status: AC
Start: 2023-12-31 — End: ?

## 2023-12-31 NOTE — Progress Notes (Signed)
 Outpatient Endocrinology Note [name], MD   Patient's Name: Gordon Green    DOB: 1961/05/08    MRN: [id number]                                                    REASON OF VISIT: Follow up for type 2 diabetes mellitus  PCP: Georgina Quint, MD  HISTORY OF PRESENT ILLNESS:   Gordon Green is a 63 y.o. old male with past medical history listed below, is here for follow up for type 2 diabetes mellitus.   Pertinent Diabetes History: Patient was previously seen by Dr. Lucianne Muss and was last time seen in August 2024.  Patient was diagnosed with type 2 diabetes mellitus in January 2019, baseline hemoglobin A1c was 11.6%.  He has controlled type 2 diabetes mellitus.  Chronic Diabetes Complications : Retinopathy: no. Last ophthalmology exam was done on annually, following with ophthalmology regularly.  Nephropathy: CKG IIIa, on ACE/ARB /lisinopril/Farxiga following with nephrology. Peripheral neuropathy: no Coronary artery disease: no Stroke: on  Relevant comorbidities and cardiovascular risk factors: Obesity: yes Body mass index is 32.14 kg/m.  Hypertension: Yes  Hyperlipidemia : Yes, on statin , on Vascepa and fenofibrate.  Managed by cardiology.  Current / Home Diabetic regimen includes:  Metformin 1000 mg 2 times a day. Farxiga 10 mg daily.  Prior diabetic medications:  Glycemic data:   [employer]ia Contour next 1 glucometer download from February lab into December 31, 2023 reviewed average blood sugar 117.  He has been checking blood sugar occasionally mostly in the morning fasting some of the other blood sugar 114, 128, 108.  Hypoglycemia: Patient has no hypoglycemic episodes. Patient has hypoglycemia awareness.  Factors modifying glucose control: 1.  Diabetic diet assessment: 3 meals a day.  2.  Staying active or exercising:   3.  Medication compliance: compliant all of the time.  Interval history  Glucometer data as reviewed above.  He denies numbness  and ting of the feet.  No vision problem.  Taking [place] and metformin as reviewed and noted above.  Recent lab results reviewed.  Hemoglobin A1c 6.0%.  Renal function relatively stable.  He has no other complaints today.  REVIEW OF SYSTEMS As per history of present illness.   PAST MEDICAL HISTORY: Past Medical History:  Diagnosis Date   Cancer (HCC) 02/2004   ;Hx: of right kidney cancer; kidney removed in 2005   Diabetes mellitus without complication (HCC)    GERD (gastroesophageal reflux disease)    Gout    Hx: of   Hyperlipidemia    Hypertension     PAST SURGICAL HISTORY: Past Surgical History:  Procedure Laterality Date   FRACTURE SURGERY N/A    Phreesia 12/21/2020   Fx: tibula     Plateau fx:   HERNIA REPAIR N/A    Phreesia 12/21/2020   INGUINAL HERNIA REPAIR     age 87   KIDNEY SURGERY  02/2004   Hx; of removal of right kidnety in 2005   ORIF TIBIA PLATEAU Left 08/25/2013   Procedure: OPEN REDUCTION INTERNAL FIXATION (ORIF) TIBIAL PLATEAU;  Surgeon: Sheral Apley, MD;  Location: [facility] OR;  Service: Orthopedics;  Laterality: Left;    ALLERGIES: No Known Allergies  FAMILY HISTORY:  Family History  Problem Relation Age of Onset   Heart disease Father  Hyperlipidemia Brother    Colon cancer Neg Hx    Esophageal cancer Neg Hx    Prostate cancer Neg Hx    Rectal cancer Neg Hx    Stomach cancer Neg Hx    Diabetes Neg Hx     SOCIAL HISTORY: Social History   Socioeconomic History   Marital status: Married    Spouse name: Not on file   Number of children: Not on file   Years of education: Not on file   Highest education level: Associate degree: occupational, Scientist, product/process development, or vocational program  Occupational History   Not on file  Tobacco Use   Smoking status: Former    Current packs/day: 0.00    Types: Cigarettes    Quit date: 03/24/2004    Years since quitting: 19.7   Smokeless tobacco: Never   Tobacco comments:    quit smoking cigarettes in 2005   Substance and Sexual Activity   Alcohol use: Yes    Alcohol/week: 4.0 standard drinks of alcohol    Types: 2 Cans of beer, 2 Standard drinks or equivalent per week   Drug use: No   Sexual activity: Not on file  Other Topics Concern   Not on file  Social History Narrative   Not on file   Social Drivers of Health   Financial Resource Strain: Low Risk  (03/12/2023)   Overall Financial Resource Strain (CARDIA)    Difficulty of Paying Living Expenses: Not hard at all  Food Insecurity: No Food Insecurity (03/12/2023)   Hunger Vital Sign    Worried About Running Out of Food in the Last Year: Never true    Ran Out of Food in the Last Year: Never true  Transportation Needs: No Transportation Needs (03/12/2023)   PRAPARE - Administrator, Civil Service (Medical): No    Lack of Transportation (Non-Medical): No  Physical Activity: Sufficiently Active (03/12/2023)   Exercise Vital Sign    Days of Exercise per Week: 5 days    Minutes of Exercise per Session: 40 min  Stress: No Stress Concern Present (03/12/2023)   [employer] of Occupational Health - Occupational Stress Questionnaire    Feeling of Stress : Only a little  Social Connections: Socially Integrated (03/12/2023)   Social Connection and Isolation Panel [NHANES]    Frequency of Communication with Friends and Family: More than three times a week    Frequency of Social Gatherings with Friends and Family: Once a week    Attends Religious Services: 1 to 4 times per year    Active Member of [employer] or Organizations: Yes    Attends Banker Meetings: 1 to 4 times per year    Marital Status: Married    MEDICATIONS:  Current Outpatient Medications  Medication Sig Dispense Refill   acetaminophen (TYLENOL) 500 MG tablet Take 500 mg by mouth every 6 (six) hours as needed for mild pain, fever or headache.     allopurinol (ZYLOPRIM) 300 MG tablet TAKE 1 TABLET BY MOUTH EVERY DAY 90 tablet 2   Ascorbic Acid (VITAMIN C)  1000 MG tablet Take 1,000 mg by mouth daily.     aspirin EC 81 MG tablet Take 81 mg by mouth daily. Swallow whole.     atorvastatin (LIPITOR) 40 MG tablet TAKE 1 TABLET BY MOUTH EVERY DAY 90 tablet 3   blood glucose meter kit and supplies Please check your blood pressure twice a day and keep a log. 1 each 0   CONTOUR NEXT  TEST test strip USE AS INSTRUCTED TO TEST BLOOD SUGARS 2 TIMES DAILY. DX CODE 100 each 3   FARXIGA 10 MG TABS tablet Take 10 mg by mouth daily.     fenofibrate micronized (LOFIBRA) 134 MG capsule TAKE 1 CAPSULE BY MOUTH EVERY DAY 90 capsule 1   glucose blood (CONTOUR NEXT TEST) test strip TEST BLOOD SUGAR EVERY DAY 100 strip 3   icosapent Ethyl (VASCEPA) 1 g capsule Take 2 capsules (2 g total) by mouth 2 (two) times daily. 120 capsule 3   Lancets MISC 1 each by Does not apply route 2 (two) times daily. To be used with Contour glucose meter. Dx: E11.8 200 each 0   lisinopril (ZESTRIL) 20 MG tablet Take 20 mg by mouth daily.     Menaquinone-7 (VITAMIN K2 PO) Take by mouth.     omeprazole (PRILOSEC OTC) 20 MG tablet Take 20 mg by mouth daily.     Turmeric (QC TUMERIC COMPLEX PO) Take 1 capsule by mouth as needed (1500 mg).     metFORMIN (GLUCOPHAGE) 1000 MG tablet Take 1 tablet (1,000 mg total) by mouth 2 (two) times daily with a meal. 180 tablet 3   zolpidem (AMBIEN) 10 MG tablet Take 1 tablet (10 mg total) by mouth at bedtime as needed for sleep. 15 tablet 1   No current facility-administered medications for this visit.    PHYSICAL EXAM: Vitals:   12/31/23 1505  BP: 124/70  Pulse: 100  Resp: 16  SpO2: 96%  Weight: 237 lb (107.5 kg)  Height: 6' (1.829 m)   Body mass index is 32.14 kg/m.  Wt Readings from Last 3 Encounters:  12/31/23 237 lb (107.5 kg)  06/27/23 228 lb 12.8 oz (103.8 kg)  03/07/23 231 lb 12.8 oz (105.1 kg)    General: Well developed, well nourished male in no apparent distress.  HEENT: AT/[place], no external lesions.  Eyes: Conjunctiva clear and no  icterus. Neck: Neck supple  Lungs: Respirations not labored Neurologic: Alert, oriented, normal speech Extremities / Skin: Dry. No sores or rashes noted.  Psychiatric: Does not appear depressed or anxious   Diabetic Foot Exam - Simple   Simple Foot Form Diabetic Foot exam was performed with the following findings: Yes 12/31/2023  3:54 PM  Visual Inspection No deformities, no ulcerations, no other skin breakdown bilaterally: Yes Sensation Testing Intact to touch and monofilament testing bilaterally: Yes Pulse Check Posterior Tibialis and Dorsalis pulse intact bilaterally: Yes Comments    LABS Reviewed Lab Results  Component Value Date   HGBA1C 6.0 (H) 12/24/2023   HGBA1C 5.8 06/24/2023   HGBA1C 5.9 12/11/2022   Lab Results  Component Value Date   FRUCTOSAMINE 211 12/19/2022   Lab Results  Component Value Date   CHOL 127 06/27/2021   HDL 25.20 (L) 06/27/2021   LDLCALC Comment (A) 12/22/2020   LDLDIRECT 71.0 06/27/2021   TRIG 352.0 (H) 06/27/2021   CHOLHDL 5 06/27/2021   Lab Results  Component Value Date   MICRALBCREAT 17.7 06/24/2023   MICRALBCREAT 15.7 04/04/2022   Lab Results  Component Value Date   CREATININE 1.27 12/24/2023   Lab Results  Component Value Date   GFR 57.76 (L) 06/24/2023    ASSESSMENT / PLAN  1. Controlled type 2 diabetes mellitus with complication, without long-term current use of insulin (HCC)     Diabetes Mellitus type 2, complicated by CKD. - Diabetic status / severity: Controlled.  Lab Results  Component Value Date   HGBA1C 6.0 (H)  12/24/2023    - Hemoglobin A1c goal : <6.5%  - Medications: No change.  I) metformin 1000 mg 2 times a day. II) Farxiga 10 mg daily.  - Home glucose testing: Few times a week at different times of the day. - Discussed/ Gave Hypoglycemia treatment plan.  # Consult : not required at this time.   # Annual urine for microalbuminuria/ creatinine ratio, no microalbuminuria currently, continue  ACE/ARB /lisinopril and Farxiga.  Following with nephrology. Last  Lab Results  Component Value Date   MICRALBCREAT 17.7 06/24/2023    # Foot check nightly / neuropathy.  # Annual dilated diabetic eye exams.   - Diet: Make healthy diabetic food choices - Life style / activity / exercise: Discussed.  2. Blood pressure  -  BP Readings from Last 1 Encounters:  12/31/23 124/70    - Control is in target.  - No change in current plans.  3. Lipid status / Hyperlipidemia /hypertriglyceridemia - Last  Lab Results  Component Value Date   LDLCALC Comment (A) 12/22/2020   - Continue atorvastatin, Vascepa, fenofibrate, managed by cardiology.  Diagnoses and all orders for this visit:  Controlled type 2 diabetes mellitus with complication, without long-term current use of insulin (HCC) -     metFORMIN (GLUCOPHAGE) 1000 MG tablet; Take 1 tablet (1,000 mg total) by mouth 2 (two) times daily with a meal. -     BASIC METABOLIC PANEL WITH GFR -     Hemoglobin A1c -     Microalbumin / creatinine urine ratio   DISPOSITION Follow up in clinic in 6 months suggested.  Labs prior to follow-up visit.   All questions answered and patient verbalized understanding of the plan.  [place] Campbell Kray, MD [facility] [address] [place], [address] [place], [place] [address] Phone # [phone]  At least part of this note was generated using voice recognition software. Inadvertent word errors may have occurred, which were not recognized during the proofreading process.

## 2024-01-06 ENCOUNTER — Other Ambulatory Visit: Payer: Self-pay | Admitting: *Deleted

## 2024-01-06 DIAGNOSIS — E1169 Type 2 diabetes mellitus with other specified complication: Secondary | ICD-10-CM

## 2024-01-12 ENCOUNTER — Encounter: Payer: Self-pay | Admitting: Endocrinology

## 2024-01-13 MED ORDER — FENOFIBRATE MICRONIZED 134 MG PO CAPS: 134.0000 mg | ORAL_CAPSULE | Freq: Every day | ORAL | 1 refills | Status: DC

## 2024-02-11 DIAGNOSIS — N1831 Chronic kidney disease, stage 3a: Secondary | ICD-10-CM | POA: Diagnosis not present

## 2024-02-18 DIAGNOSIS — N182 Chronic kidney disease, stage 2 (mild): Secondary | ICD-10-CM | POA: Diagnosis not present

## 2024-02-18 DIAGNOSIS — R809 Proteinuria, unspecified: Secondary | ICD-10-CM | POA: Diagnosis not present

## 2024-02-18 DIAGNOSIS — I129 Hypertensive chronic kidney disease with stage 1 through stage 4 chronic kidney disease, or unspecified chronic kidney disease: Secondary | ICD-10-CM | POA: Diagnosis not present

## 2024-02-18 DIAGNOSIS — E1122 Type 2 diabetes mellitus with diabetic chronic kidney disease: Secondary | ICD-10-CM | POA: Diagnosis not present

## 2024-02-21 ENCOUNTER — Other Ambulatory Visit: Payer: Self-pay | Admitting: Endocrinology

## 2024-02-21 DIAGNOSIS — E1169 Type 2 diabetes mellitus with other specified complication: Secondary | ICD-10-CM

## 2024-03-13 DIAGNOSIS — E1169 Type 2 diabetes mellitus with other specified complication: Secondary | ICD-10-CM | POA: Diagnosis not present

## 2024-03-13 DIAGNOSIS — E785 Hyperlipidemia, unspecified: Secondary | ICD-10-CM | POA: Diagnosis not present

## 2024-03-15 LAB — NMR, LIPOPROFILE
Cholesterol, Total: 123 mg/dL (ref 100–199)
HDL Particle Number: 22.1 umol/L — ABNORMAL LOW (ref >=30.5)
HDL-C: 20 mg/dL — ABNORMAL LOW (ref >39)
LDL Particle Number: 965 nmol/L (ref <1000)
LDL Size: 19.6 nm — ABNORMAL LOW (ref >20.5)
LDL-C (NIH Calc): 62 mg/dL (ref 0–99)
LP-IR Score: 75 — ABNORMAL HIGH (ref <=45)
Small LDL Particle Number: 796 nmol/L — ABNORMAL HIGH (ref <=527)
Triglycerides: 255 mg/dL — ABNORMAL HIGH (ref 0–149)

## 2024-03-18 ENCOUNTER — Ambulatory Visit: Payer: Self-pay | Admitting: Internal Medicine

## 2024-03-19 ENCOUNTER — Ambulatory Visit: Payer: BC Managed Care – PPO | Attending: Internal Medicine | Admitting: Internal Medicine

## 2024-03-19 ENCOUNTER — Encounter: Payer: Self-pay | Admitting: Internal Medicine

## 2024-03-19 VITALS — BP 126/84 | HR 88 | Ht 72.0 in | Wt 241.6 lb

## 2024-03-19 DIAGNOSIS — R931 Abnormal findings on diagnostic imaging of heart and coronary circulation: Secondary | ICD-10-CM | POA: Diagnosis not present

## 2024-03-19 DIAGNOSIS — E785 Hyperlipidemia, unspecified: Secondary | ICD-10-CM

## 2024-03-19 DIAGNOSIS — E1169 Type 2 diabetes mellitus with other specified complication: Secondary | ICD-10-CM | POA: Diagnosis not present

## 2024-03-19 MED ORDER — EZETIMIBE 10 MG PO TABS: 10.0000 mg | ORAL_TABLET | Freq: Every day | ORAL | 3 refills | Status: AC

## 2024-03-19 NOTE — Patient Instructions (Signed)
 Medication Instructions:  START zetia 10mg  once daily  CONTINUE other current meds  *If you need a refill on your cardiac medications before your next appointment, please call your pharmacy*  Lab Work: FASTING lab work in August (w/endocrinology)  If you have labs (blood work) drawn today and your tests are completely normal, you will receive your results only by: [employer] (if you have MyChart) OR A paper copy in the mail If you have any lab test that is abnormal or we need to change your treatment, we will call you to review the results.   Follow-Up: At [facility], you and your health needs are our priority.  As part of our continuing mission to provide you with exceptional heart care, our providers are all part of one team.  This team includes your primary Cardiologist (physician) and Advanced Practice Providers or APPs (Physician Assistants and Nurse Practitioners) who all work together to provide you with the care you need, when you need it.  Your next appointment:    12 months with Dr. Maximo Spar  We recommend signing up for the patient portal called "MyChart".  Sign up information is provided on this After Visit Summary.  MyChart is used to connect with patients for Virtual Visits (Telemedicine).  Patients are able to view lab/test results, encounter notes, upcoming appointments, etc.  Non-urgent messages can be sent to your provider as well.   To learn more about what you can do with MyChart, go to [email or web address].

## 2024-03-19 NOTE — Progress Notes (Signed)
 LIPID CLINIC CONSULT NOTE  Chief Complaint:  Follow-up high triglycerides  Primary Care Physician: Gordon Hammersmith, MD  Primary Cardiologist:  None  HPI:  Gordon Green is a 63 y.o. male who is being seen today for the evaluation of high triglyceride at the request of Green, Gordon Jose, *.  This is a pleasant 63 year old male kindly referred by Dr. Vedia Green, for evaluation management of high triglycerides.  He recently established with his new primary care provider but has a longstanding history of high triglycerides he says dating back to at least his 47s.  He also has type 2 diabetes which appears well managed recent A1c of 5.8, hypertension and dyslipidemia.  He does report family history of heart disease including his father who had unfortunately died of an MI at age 4 and had high cholesterol.  He had previously been on fenofibrate  and prior to that simvastatin  Mediport apparently the simvastatin  did not lower his cholesterol as well as expected.  He had also been prescribed Vascepa  but was only taking 2 g twice daily.  He also made dietary changes and had increased saturated fats in his triglycerides more recently everywhere 2022 showed triglycerides of 1021.  He then had increased his Vascepa  to 2 g twice daily, changed his diet and was more active and triglycerides have come down significantly to 514, however his LDL cholesterol is elevated at 108.  He is not on a statin.  He does have a history of kidney cancer and is a survivor of this.  He is never had a stroke or MI that he is aware of.  Has not had any prior cardiac testing.  He denies any symptoms such as chest pain or worsening shortness of breath.  05/31/2021  Gordon Green returns today for follow-up.  He underwent recent calcium  scoring which surprisingly showed an abnormally high calcium  score of 2833.  This demonstrated multivessel coronary calcium  which somewhat spared the left circumflex otherwise a significant  finding.  He reports only about 3 years of diabetes therefore I think it is unlikely related to that.  Suspect there is family history of strong early onset heart disease in his father as well as dyslipidemia with high triglycerides are major risk factors.  We had a discussion today about possible stress testing however he said he did significant hike this past weekend for several hours in the [facility] and had no chest pain or significant shortness of breath.  He exercises regularly and says he is asymptomatic.  09/13/2022  Gordon Green returns today for follow-up.  He seems to be doing well.  Denies any coronary symptoms although had a high calcium  score.  He has been more physically active and has lost about 10 pounds.  This will likely result in no lower cholesterol.  He is compliant with his medications and denies any side effects with this.  Of note he did have some mild elevation in the ALT in the past, probably from some fatty liver.  This will need to be monitored on combination therapy.  03/07/2023  Gordon Green returns today for follow-up.  His triglycerides are significantly improved at this point.  Probably the best that I seen him at 210 back in November, total cholesterol 120, HDL 22 and LDL 63.  Initially his blood pressure was elevated 6/74 however recheck came down to 110/70.  He does have a high LP(a) at 114.  His LDL particle number is now down to 929.  03/20/2023  Mr.  Green returns today for follow-up.  His lipids are little bit better than they were last year but LDL particle numbers remain elevated at 1170 with an LDL of 62 and triglycerides 255.  HDL is low at 20 consistent with a metabolic dyslipidemia.  His A1c is a little higher at 6.0%.  He said he has gained a little weight has been less active but he is not working on it.  He remains compliant with medications including atorvastatin , Vascepa  and fenofibrate .  He did inquire today about testosterone replacement.   He said at 1 point about 15 years ago his testosterone was a little low.  He was going to look into having that rechecked.  I encouraged him to discuss this with his endocrinologist however from a cardiac standpoint the risk of worsening cardiovascular disease is fairly low as well as patient's are treated to a normal range.  I would advise however against treatment unless he was really symptomatic more than just fatigue rather having other side effects such as erectile dysfunction or other clearly linked disorders.  PMHx:  Past Medical History:  Diagnosis Date   Cancer (HCC) 02/2004   ;Hx: of right kidney cancer; kidney removed in 2005   Diabetes mellitus without complication (HCC)    GERD (gastroesophageal reflux disease)    Gout    Hx: of   Hyperlipidemia    Hypertension     Past Surgical History:  Procedure Laterality Date   FRACTURE SURGERY N/A    Phreesia 12/21/2020   Fx: tibula     Plateau fx:   HERNIA REPAIR N/A    Phreesia 12/21/2020   INGUINAL HERNIA REPAIR     age 60   KIDNEY SURGERY  02/2004   Hx; of removal of right kidnety in 2005   ORIF TIBIA PLATEAU Left 08/25/2013   Procedure: OPEN REDUCTION INTERNAL FIXATION (ORIF) TIBIAL PLATEAU;  Surgeon: Saundra Curl, MD;  Location: [facility] OR;  Service: Orthopedics;  Laterality: Left;    FAMHx:  Family History  Problem Relation Age of Onset   Heart disease Father    Hyperlipidemia Brother    Colon cancer Neg Hx    Esophageal cancer Neg Hx    Prostate cancer Neg Hx    Rectal cancer Neg Hx    Stomach cancer Neg Hx    Diabetes Neg Hx     SOCHx:   reports that he quit smoking about 20 years ago. His smoking use included cigarettes. He has never used smokeless tobacco. He reports current alcohol use of about 4.0 standard drinks of alcohol per week. He reports that he does not use drugs.  ALLERGIES:  No Known Allergies  ROS: Pertinent items noted in HPI and remainder of comprehensive ROS otherwise negative.  HOME  MEDS: Current Outpatient Medications on File Prior to Visit  Medication Sig Dispense Refill   acetaminophen  (TYLENOL ) 500 MG tablet Take 500 mg by mouth every 6 (six) hours as needed for mild pain, fever or headache.     allopurinol  (ZYLOPRIM ) 300 MG tablet TAKE 1 TABLET BY MOUTH EVERY DAY 90 tablet 2   Ascorbic Acid (VITAMIN C) 1000 MG tablet Take 1,000 mg by mouth daily.     aspirin  EC 81 MG tablet Take 81 mg by mouth daily. Swallow whole.     atorvastatin  (LIPITOR) 40 MG tablet TAKE 1 TABLET BY MOUTH EVERY DAY 90 tablet 3   blood glucose meter kit and supplies Please check your blood pressure twice a day and  keep a log. 1 each 0   CONTOUR NEXT TEST test strip USE AS INSTRUCTED TO TEST BLOOD SUGARS 2 TIMES DAILY. DX CODE 100 each 3   FARXIGA 10 MG TABS tablet Take 10 mg by mouth daily.     fenofibrate  micronized (LOFIBRA) 134 MG capsule Take 1 capsule (134 mg total) by mouth daily. 90 capsule 1   glucose blood (CONTOUR NEXT TEST) test strip TEST BLOOD SUGAR EVERY DAY 100 strip 3   Lancets MISC 1 each by Does not apply route 2 (two) times daily. To be used with Contour glucose meter. Dx: E11.8 200 each 0   lisinopril  (ZESTRIL ) 20 MG tablet Take 20 mg by mouth daily.     Menaquinone-7 (VITAMIN K2 PO) Take by mouth.     metFORMIN  (GLUCOPHAGE ) 1000 MG tablet Take 1 tablet (1,000 mg total) by mouth 2 (two) times daily with a meal. 180 tablet 3   omeprazole  (PRILOSEC  OTC) 20 MG tablet Take 20 mg by mouth daily.     Turmeric (QC TUMERIC COMPLEX PO) Take 1 capsule by mouth as needed (1500 mg).     VASCEPA  1 g capsule TAKE 2 CAPSULES BY MOUTH 2 TIMES DAILY. 360 capsule 2   zolpidem  (AMBIEN ) 10 MG tablet Take 1 tablet (10 mg total) by mouth at bedtime as needed for sleep. 15 tablet 1   No current facility-administered medications on file prior to visit.    LABS/IMAGING: No results found for this or any previous visit (from the past 48 hours). No results found.  LIPID PANEL:    Component Value  Date/Time   CHOL 127 06/27/2021 1049   CHOL 206 (H) 12/22/2020 1352   TRIG 352.0 (H) 06/27/2021 1049   HDL 25.20 (L) 06/27/2021 1049   HDL 21 (L) 12/22/2020 1352   CHOLHDL 5 06/27/2021 1049   VLDL 70.4 (H) 06/27/2021 1049   LDLCALC Comment (A) 12/22/2020 1352   LDLDIRECT 71.0 06/27/2021 1049    WEIGHTS: Wt Readings from Last 3 Encounters:  03/19/24 241 lb 9.6 oz (109.6 kg)  12/31/23 237 lb (107.5 kg)  06/27/23 228 lb 12.8 oz (103.8 kg)    VITALS: BP 126/84 (BP Location: Left Arm, Patient Position: Sitting)   Pulse 88   Ht 6' (1.829 m)   Wt 241 lb 9.6 oz (109.6 kg)   SpO2 97%   BMI 32.77 kg/m   EXAM: General appearance: alert and no distress Lungs: clear to auscultation bilaterally Heart: regular rate and rhythm, S1, S2 normal, no murmur, click, rub or gallop Extremities: extremities normal, atraumatic, no cyanosis or edema Neurologic: Grossly normal  EKG: Deferred  ASSESSMENT: Mixed dyslipidemia with high triglycerides, 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  Mr. Boice has had somewhat better lipids compared to last year although his LDL particle numbers remain elevated.  Particular these are small LDL particles which is most consistent with a metabolic syndrome.  This will further increase his risk although his LDL was at target.  I think he would benefit from further reduction in this as well as his triglycerides with addition of ezetimibe.  We discussed this today and he seemed interested.  His A1c is 6.0%.  He is actively working on weight loss and dietary modifications.  He has an endocrinology follow-up in August which would be a good time to repeat his lipids.  He did have some questions about  testosterone therapy which I think could be addressed with the endocrinologist.  We also discussed the possibility of the GLP 1  agonist and weight loss which he might be a candidate for in the future as well.  Plan follow-up with me in or sooner as necessary.  Hazle Lites, MD, [facility], FNLA, FACP  [place]  [facility]  Medical Director of the Advanced Lipid Disorders &  Cardiovascular Risk Reduction Clinic Diplomate of the American Board of Clinical Lipidology Attending Cardiologist  Direct Dial: [phone]  Fax: [phone]  Website:  [email or web address] 03/19/2024, 8:32 AM

## 2024-04-03 ENCOUNTER — Other Ambulatory Visit (HOSPITAL_BASED_OUTPATIENT_CLINIC_OR_DEPARTMENT_OTHER): Payer: Self-pay | Admitting: Internal Medicine

## 2024-04-03 ENCOUNTER — Other Ambulatory Visit: Payer: Self-pay | Admitting: Endocrinology

## 2024-05-22 DIAGNOSIS — N1831 Chronic kidney disease, stage 3a: Secondary | ICD-10-CM | POA: Diagnosis not present

## 2024-06-17 ENCOUNTER — Other Ambulatory Visit: Payer: BC Managed Care – PPO

## 2024-06-17 DIAGNOSIS — E118 Type 2 diabetes mellitus with unspecified complications: Secondary | ICD-10-CM | POA: Diagnosis not present

## 2024-06-18 LAB — BASIC METABOLIC PANEL WITH GFR
BUN/Creatinine Ratio: 22 (calc) (ref 6–22)
BUN: 32 mg/dL — ABNORMAL HIGH (ref 7–25)
CO2: 26 mmol/L (ref 20–32)
Calcium: 9.9 mg/dL (ref 8.6–10.3)
Chloride: 102 mmol/L (ref 98–110)
Creat: 1.45 mg/dL — ABNORMAL HIGH (ref 0.70–1.35)
Glucose, Bld: 115 mg/dL — ABNORMAL HIGH (ref 65–99)
Potassium: 4.6 mmol/L (ref 3.5–5.3)
Sodium: 138 mmol/L (ref 135–146)
eGFR: 54 mL/min/1.73m2 — ABNORMAL LOW (ref >=60)

## 2024-06-18 LAB — HEMOGLOBIN A1C
Hgb A1c MFr Bld: 6.2 % — ABNORMAL HIGH (ref <5.7)
Mean Plasma Glucose: 131 mg/dL
eAG (mmol/L): 7.3 mmol/L

## 2024-06-18 LAB — MICROALBUMIN / CREATININE URINE RATIO
Creatinine, Urine: 126 mg/dL (ref 20–320)
Microalb Creat Ratio: 458 mg/g{creat} — ABNORMAL HIGH (ref <30)
Microalb, Ur: 57.7 mg/dL

## 2024-06-19 ENCOUNTER — Ambulatory Visit: Payer: Self-pay | Admitting: Endocrinology

## 2024-06-30 ENCOUNTER — Ambulatory Visit (INDEPENDENT_AMBULATORY_CARE_PROVIDER_SITE_OTHER): Payer: BC Managed Care – PPO | Admitting: Endocrinology

## 2024-06-30 ENCOUNTER — Encounter: Payer: Self-pay | Admitting: Endocrinology

## 2024-06-30 VITALS — BP 118/64 | HR 84 | Resp 20 | Ht 72.0 in | Wt 240.6 lb

## 2024-06-30 DIAGNOSIS — E118 Type 2 diabetes mellitus with unspecified complications: Secondary | ICD-10-CM

## 2024-06-30 DIAGNOSIS — E1122 Type 2 diabetes mellitus with diabetic chronic kidney disease: Secondary | ICD-10-CM | POA: Diagnosis not present

## 2024-06-30 DIAGNOSIS — E785 Hyperlipidemia, unspecified: Secondary | ICD-10-CM | POA: Diagnosis not present

## 2024-06-30 DIAGNOSIS — Z7984 Long term (current) use of oral hypoglycemic drugs: Secondary | ICD-10-CM

## 2024-06-30 DIAGNOSIS — N189 Chronic kidney disease, unspecified: Secondary | ICD-10-CM

## 2024-06-30 MED ORDER — FARXIGA 10 MG PO TABS: 10.0000 mg | ORAL_TABLET | Freq: Every day | ORAL | 3 refills | Status: AC

## 2024-06-30 NOTE — Progress Notes (Signed)
 Outpatient Endocrinology Note Gordon Armentha Branagan, MD   Patient's Name: Gordon Green    DOB: July 18, 1961    MRN: [id number]                                                    REASON OF VISIT: Follow up for type 2 diabetes mellitus  PCP: Purcell Emil Schanz, MD  HISTORY OF PRESENT ILLNESS:   Gordon Green is a 63 y.o. old male with past medical history listed below, is here for follow up for type 2 diabetes mellitus.   Pertinent Diabetes History: Patient was previously seen by Dr. Von and was last time seen in August 2024.  Patient was diagnosed with type 2 diabetes mellitus in January 2019, baseline hemoglobin A1c was 11.6%.  He has controlled type 2 diabetes mellitus.  Chronic Diabetes Complications : Retinopathy: no. Last ophthalmology exam was done on annually, following with ophthalmology regularly.  Nephropathy: CKG IIIa, microalbuminuria, on ACE/ARB /lisinopril /Farxiga  following with nephrology. Peripheral neuropathy: no Coronary artery disease: no Stroke: on  Relevant comorbidities and cardiovascular risk factors: Obesity: yes Body mass index is 32.63 kg/m.  Hypertension: Yes  Hyperlipidemia : Yes, on statin , on Lipitor, Vascepa  and fenofibrate .  Managed by cardiology.  Current / Home Diabetic regimen includes:  Metformin  1000 mg 2 times a day. Farxiga  10 mg daily.  Prior diabetic medications:  Glycemic data:    [employer] Contour next 1 glucometer, forget to bring glucometer in the clinic today.  He recalls fasting blood sugar usually 120s sometimes up to 110.  Yesterday afternoon he had blood sugar 150 after eating in 70 minutes.   Hypoglycemia: Patient has no hypoglycemic episodes. Patient has hypoglycemia awareness.  Factors modifying glucose control: 1.  Diabetic diet assessment: 3 meals a day.  2.  Staying active or exercising:   3.  Medication compliance: compliant all of the time.  Interval history  Patient lab results reviewed.  Hemoglobin A1c  6.2%.  eGFR 54.  Creatinine and BUN elevated likely dehydration worsening renal function.  Urine microalbumin creatinine ratio elevated.  Reports lately he has been eating increased amount of carbohydrates.  Diabetes regimen as reviewed above.  No new complaints.  REVIEW OF SYSTEMS As per history of present illness.   PAST MEDICAL HISTORY: Past Medical History:  Diagnosis Date   Cancer (HCC) 02/2004   ;Hx: of right kidney cancer; kidney removed in 2005   Diabetes mellitus without complication (HCC)    GERD (gastroesophageal reflux disease)    Gout    Hx: of   Hyperlipidemia    Hypertension     PAST SURGICAL HISTORY: Past Surgical History:  Procedure Laterality Date   FRACTURE SURGERY N/A    [employer]rees[employer] 12/21/2020   Fx: tibula     Plateau fx:   HERNIA REPAIR N/A    [employer] 12/21/2020   INGUINAL HERNIA REPAIR     age 55   KIDNEY SURGERY  02/2004   Hx; of removal of right kidnety in 2005   ORIF TIBIA PLATEAU Left 08/25/2013   Procedure: OPEN REDUCTION INTERNAL FIXATION (ORIF) TIBIAL PLATEAU;  Surgeon: Evalene JONETTA Chancy, MD;  Location: [facility] OR;  Service: Orthopedics;  Laterality: Left;    ALLERGIES: No Known Allergies  FAMILY HISTORY:  Family History  Problem Relation Age of Onset   Heart disease Father  Hyperlipidemia Brother    Colon cancer Neg Hx    Esophageal cancer Neg Hx    Prostate cancer Neg Hx    Rectal cancer Neg Hx    Stomach cancer Neg Hx    Diabetes Neg Hx     SOCIAL HISTORY: Social History   Socioeconomic History   Marital status: Married    Spouse name: Not on file   Number of children: Not on file   Years of education: Not on file   Highest education level: Associate degree: occupational, Scientist, product/process development, or vocational program  Occupational History   Not on file  Tobacco Use   Smoking status: Former    Current packs/day: 0.00    Types: Cigarettes    Quit date: 03/24/2004    Years since quitting: 20.2   Smokeless tobacco: Never   Tobacco  comments:    quit smoking cigarettes in 2005  Substance and Sexual Activity   Alcohol use: Yes    Alcohol/week: 4.0 standard drinks of alcohol    Types: 2 Cans of beer, 2 Standard drinks or equivalent per week   Drug use: No   Sexual activity: Not on file  Other Topics Concern   Not on file  Social History Narrative   Not on file   Social Drivers of Health   Financial Resource Strain: Low Risk  (03/12/2023)   Overall Financial Resource Strain (CARDIA)    Difficulty of Paying Living Expenses: Not hard at all  Food Insecurity: No Food Insecurity (03/12/2023)   Hunger Vital Sign    Worried About Running Out of Food in the Last Year: Never true    Ran Out of Food in the Last Year: Never true  Transportation Needs: No Transportation Needs (03/12/2023)   PRAPARE - Administrator, Civil Service (Medical): No    Lack of Transportation (Non-Medical): No  Physical Activity: Sufficiently Active (03/12/2023)   Exercise Vital Sign    Days of Exercise per Week: 5 days    Minutes of Exercise per Session: 40 min  Stress: No Stress Concern Present (03/12/2023)   [employer] of Occupational Health - Occupational Stress Questionnaire    Feeling of Stress : Only a little  Social Connections: Socially Integrated (03/12/2023)   Social Connection and Isolation Panel    Frequency of Communication with Friends and Family: More than three times a week    Frequency of Social Gatherings with Friends and Family: Once a week    Attends Religious Services: 1 to 4 times per year    Active Member of [employer] or Organizations: Yes    Attends Banker Meetings: 1 to 4 times per year    Marital Status: Married    MEDICATIONS:  Current Outpatient Medications  Medication Sig Dispense Refill   acetaminophen  (TYLENOL ) 500 MG tablet Take 500 mg by mouth every 6 (six) hours as needed for mild pain, fever or headache.     allopurinol  (ZYLOPRIM ) 300 MG tablet TAKE 1 TABLET BY MOUTH EVERY DAY 90  tablet 2   Ascorbic Acid (VITAMIN C) 1000 MG tablet Take 1,000 mg by mouth daily.     aspirin  EC 81 MG tablet Take 81 mg by mouth daily. Swallow whole.     atorvastatin  (LIPITOR) 40 MG tablet TAKE 1 TABLET BY MOUTH EVERY DAY 90 tablet 3   blood glucose meter kit and supplies Please check your blood pressure twice a day and keep a log. 1 each 0   CONTOUR NEXT TEST  test strip USE AS INSTRUCTED TO TEST BLOOD SUGARS 2 TIMES DAILY. DX CODE 100 each 3   ezetimibe  (ZETIA ) 10 MG tablet Take 1 tablet (10 mg total) by mouth daily. 90 tablet 3   fenofibrate  micronized (LOFIBRA) 134 MG capsule Take 1 capsule (134 mg total) by mouth daily. 90 capsule 1   glucose blood (CONTOUR NEXT TEST) test strip TEST BLOOD SUGAR EVERY DAY 100 strip 3   Lancets MISC 1 each by Does not apply route 2 (two) times daily. To be used with Contour glucose meter. Dx: E11.8 200 each 0   lisinopril  (ZESTRIL ) 20 MG tablet Take 20 mg by mouth daily.     Menaquinone-7 (VITAMIN K2 PO) Take by mouth.     metFORMIN  (GLUCOPHAGE ) 1000 MG tablet Take 1 tablet (1,000 mg total) by mouth 2 (two) times daily with a meal. 180 tablet 3   omeprazole  (PRILOSEC  OTC) 20 MG tablet Take 20 mg by mouth daily.     Turmeric (QC TUMERIC COMPLEX PO) Take 1 capsule by mouth as needed (1500 mg).     VASCEPA  1 g capsule TAKE 2 CAPSULES BY MOUTH 2 TIMES DAILY. 360 capsule 2   zolpidem  (AMBIEN ) 10 MG tablet Take 1 tablet (10 mg total) by mouth at bedtime as needed for sleep. 15 tablet 1   FARXIGA  10 MG TABS tablet Take 1 tablet (10 mg total) by mouth daily. 90 tablet 3   No current facility-administered medications for this visit.    PHYSICAL EXAM: Vitals:   06/30/24 1501  BP: 118/64  Pulse: 84  Resp: 20  SpO2: 96%  Weight: 240 lb 9.6 oz (109.1 kg)  Height: 6' (1.829 m)   Body mass index is 32.63 kg/m.  Wt Readings from Last 3 Encounters:  06/30/24 240 lb 9.6 oz (109.1 kg)  03/19/24 241 lb 9.6 oz (109.6 kg)  12/31/23 237 lb (107.5 kg)     General: Well developed, well nourished male in no apparent distress.  HEENT: AT/[place], no external lesions.  Eyes: Conjunctiva clear and no icterus. Neck: Neck supple  Lungs: Respirations not labored Neurologic: Alert, oriented, normal speech Extremities / Skin: Dry.   Psychiatric: Does not appear depressed or anxious   Diabetic Foot Exam - Simple   No data filed    LABS Reviewed Lab Results  Component Value Date   HGBA1C 6.2 (H) 06/17/2024   HGBA1C 6.0 (H) 12/24/2023   HGBA1C 5.8 06/24/2023   Lab Results  Component Value Date   FRUCTOSAMINE 211 12/19/2022   Lab Results  Component Value Date   CHOL 127 06/27/2021   HDL 25.20 (L) 06/27/2021   LDLCALC Comment (A) 12/22/2020   LDLDIRECT 71.0 06/27/2021   TRIG 352.0 (H) 06/27/2021   CHOLHDL 5 06/27/2021   Lab Results  Component Value Date   MICRALBCREAT 458 (H) 06/17/2024   MICRALBCREAT 90.4 (H) 11/06/2018   Lab Results  Component Value Date   CREATININE 1.45 (H) 06/17/2024   Lab Results  Component Value Date   GFR 57.76 (L) 06/24/2023    ASSESSMENT / PLAN  1. Controlled type 2 diabetes mellitus with complication, without long-term current use of insulin  (HCC)      Diabetes Mellitus type 2, complicated by CKD /microalbuminuria. - Diabetic status / severity: Controlled.  Lab Results  Component Value Date   HGBA1C 6.2 (H) 06/17/2024    - Hemoglobin A1c goal : <6.5%  Reasonable control of diabetes mellitus.  - Medications: No change.  Consider decreasing metformin  in  the future if CKD worsen.  I) metformin  1000 mg 2 times a day. II) Farxiga  10 mg daily.  - Home glucose testing: Few times a week at different times of the day.  Asked to bring glucometer in the follow-up visit. - Discussed/ Gave Hypoglycemia treatment plan.  # Consult : not required at this time.   # Annual urine for microalbuminuria/ creatinine ratio,+ microalbuminuria currently, continue ACE/ARB /lisinopril  and Farxiga .   Following with nephrology.  Recent microalbuminuria elevated, he will discuss with nephrology in coming follow-up visit in October. Last  Lab Results  Component Value Date   MICRALBCREAT 458 (H) 06/17/2024    # Foot check nightly / neuropathy.  # Annual dilated diabetic eye exams.   - Diet: Make healthy diabetic food choices - Life style / activity / exercise: Discussed.  2. Blood pressure  -  BP Readings from Last 1 Encounters:  06/30/24 118/64    - Control is in target.  - No change in current plans.  3. Lipid status / Hyperlipidemia /hypertriglyceridemia - Last  Lab Results  Component Value Date   LDLCALC Comment (A) 12/22/2020   - Continue atorvastatin , Vascepa , fenofibrate , managed by cardiology.  Diagnoses and all orders for this visit:  Controlled type 2 diabetes mellitus with complication, without long-term current use of insulin  (HCC) -     FARXIGA  10 MG TABS tablet; Take 1 tablet (10 mg total) by mouth daily.    DISPOSITION Follow up in clinic in 6 months suggested.  Labs on the same day of the visit.  All questions answered and patient verbalized understanding of the plan.  Gordon Malachy Coleman, MD [facility] Endocrinology [facility] [address] [place], [address] [place], [place] [address] Phone # [phone]  At least part of this note was generated using voice recognition software. Inadvertent word errors may have occurred, which were not recognized during the proofreading process.

## 2024-07-03 ENCOUNTER — Other Ambulatory Visit: Payer: Self-pay | Admitting: Endocrinology

## 2024-08-25 DIAGNOSIS — N1831 Chronic kidney disease, stage 3a: Secondary | ICD-10-CM | POA: Diagnosis not present

## 2024-09-03 DIAGNOSIS — I129 Hypertensive chronic kidney disease with stage 1 through stage 4 chronic kidney disease, or unspecified chronic kidney disease: Secondary | ICD-10-CM | POA: Diagnosis not present

## 2024-09-03 DIAGNOSIS — R809 Proteinuria, unspecified: Secondary | ICD-10-CM | POA: Diagnosis not present

## 2024-09-03 DIAGNOSIS — E1122 Type 2 diabetes mellitus with diabetic chronic kidney disease: Secondary | ICD-10-CM | POA: Diagnosis not present

## 2024-09-03 DIAGNOSIS — N1831 Chronic kidney disease, stage 3a: Secondary | ICD-10-CM | POA: Diagnosis not present

## 2024-11-26 ENCOUNTER — Other Ambulatory Visit: Payer: Self-pay | Admitting: Endocrinology

## 2024-11-26 DIAGNOSIS — E1169 Type 2 diabetes mellitus with other specified complication: Secondary | ICD-10-CM

## 2025-01-04 ENCOUNTER — Ambulatory Visit: Admitting: Endocrinology
